# Patient Record
Sex: Male | Born: 1935 | Race: Black or African American | Hispanic: No | Marital: Married | Smoking: Never smoker
Health system: Southern US, Community
[De-identification: ages and names within clinical notes are randomized; demographics above are authoritative.]

## PROBLEM LIST (undated history)

## (undated) DIAGNOSIS — E876 Hypokalemia: Secondary | ICD-10-CM

## (undated) DIAGNOSIS — E785 Hyperlipidemia, unspecified: Secondary | ICD-10-CM

## (undated) DIAGNOSIS — I10 Essential (primary) hypertension: Secondary | ICD-10-CM

## (undated) DIAGNOSIS — R609 Edema, unspecified: Secondary | ICD-10-CM

## (undated) DIAGNOSIS — I69959 Hemiplegia and hemiparesis following unspecified cerebrovascular disease affecting unspecified side: Secondary | ICD-10-CM

## (undated) DIAGNOSIS — I639 Cerebral infarction, unspecified: Secondary | ICD-10-CM

## (undated) DIAGNOSIS — K5900 Constipation, unspecified: Secondary | ICD-10-CM

## (undated) DIAGNOSIS — F32A Depression, unspecified: Secondary | ICD-10-CM

## (undated) DIAGNOSIS — F329 Major depressive disorder, single episode, unspecified: Secondary | ICD-10-CM

## (undated) DIAGNOSIS — F0390 Unspecified dementia without behavioral disturbance: Secondary | ICD-10-CM

## (undated) DIAGNOSIS — E119 Type 2 diabetes mellitus without complications: Secondary | ICD-10-CM

## (undated) DIAGNOSIS — R569 Unspecified convulsions: Secondary | ICD-10-CM

## (undated) HISTORY — DX: Hyperlipidemia, unspecified: E78.5

## (undated) HISTORY — DX: Constipation, unspecified: K59.00

## (undated) HISTORY — DX: Unspecified dementia, unspecified severity, without behavioral disturbance, psychotic disturbance, mood disturbance, and anxiety: F03.90

## (undated) HISTORY — DX: Depression, unspecified: F32.A

## (undated) HISTORY — DX: Hemiplegia and hemiparesis following unspecified cerebrovascular disease affecting unspecified side: I69.959

## (undated) HISTORY — DX: Unspecified convulsions: R56.9

## (undated) HISTORY — DX: Hypokalemia: E87.6

## (undated) HISTORY — DX: Cerebral infarction, unspecified: I63.9

## (undated) HISTORY — DX: Major depressive disorder, single episode, unspecified: F32.9

## (undated) HISTORY — DX: Edema, unspecified: R60.9

---

## 1998-05-09 ENCOUNTER — Encounter: Payer: Self-pay | Admitting: Emergency Medicine

## 1998-05-09 ENCOUNTER — Emergency Department (HOSPITAL_COMMUNITY): Admission: EM | Admit: 1998-05-09 | Discharge: 1998-05-09 | Payer: Self-pay | Admitting: Emergency Medicine

## 2001-12-24 ENCOUNTER — Encounter: Payer: Self-pay | Admitting: Emergency Medicine

## 2001-12-24 ENCOUNTER — Emergency Department (HOSPITAL_COMMUNITY): Admission: EM | Admit: 2001-12-24 | Discharge: 2001-12-24 | Payer: Self-pay | Admitting: Emergency Medicine

## 2002-09-22 ENCOUNTER — Encounter: Payer: Self-pay | Admitting: Emergency Medicine

## 2002-09-22 ENCOUNTER — Emergency Department (HOSPITAL_COMMUNITY): Admission: EM | Admit: 2002-09-22 | Discharge: 2002-09-22 | Payer: Self-pay | Admitting: Emergency Medicine

## 2006-02-04 ENCOUNTER — Emergency Department (HOSPITAL_COMMUNITY): Admission: EM | Admit: 2006-02-04 | Discharge: 2006-02-04 | Payer: Self-pay | Admitting: Emergency Medicine

## 2006-02-09 ENCOUNTER — Observation Stay (HOSPITAL_COMMUNITY): Admission: EM | Admit: 2006-02-09 | Discharge: 2006-02-13 | Payer: Self-pay | Admitting: *Deleted

## 2006-03-16 ENCOUNTER — Ambulatory Visit (HOSPITAL_COMMUNITY): Admission: RE | Admit: 2006-03-16 | Discharge: 2006-03-16 | Payer: Self-pay | Admitting: Gastroenterology

## 2006-03-16 ENCOUNTER — Encounter (INDEPENDENT_AMBULATORY_CARE_PROVIDER_SITE_OTHER): Payer: Self-pay | Admitting: Specialist

## 2006-05-10 ENCOUNTER — Encounter: Admission: RE | Admit: 2006-05-10 | Discharge: 2006-05-10 | Payer: Self-pay | Admitting: Urology

## 2006-10-10 ENCOUNTER — Inpatient Hospital Stay (HOSPITAL_COMMUNITY): Admission: EM | Admit: 2006-10-10 | Discharge: 2006-10-12 | Payer: Self-pay | Admitting: Emergency Medicine

## 2006-10-11 ENCOUNTER — Encounter: Payer: Self-pay | Admitting: Vascular Surgery

## 2006-10-11 ENCOUNTER — Ambulatory Visit: Payer: Self-pay | Admitting: Vascular Surgery

## 2006-10-11 ENCOUNTER — Encounter: Payer: Self-pay | Admitting: Cardiology

## 2006-12-14 ENCOUNTER — Emergency Department (HOSPITAL_COMMUNITY): Admission: EM | Admit: 2006-12-14 | Discharge: 2006-12-14 | Payer: Self-pay | Admitting: Emergency Medicine

## 2007-01-03 ENCOUNTER — Ambulatory Visit (HOSPITAL_COMMUNITY): Admission: RE | Admit: 2007-01-03 | Discharge: 2007-01-04 | Payer: Self-pay | Admitting: Ophthalmology

## 2007-12-11 IMAGING — CT CT HEAD W/O CM
1 series · 16 of 30 positions shown, 20 images · IV contrast (agent unspecified)
Comparison: MRI and CT [DATE] and 10/10/06.

CLINICAL DATA: 82 year-old male, grand mal seizure today.
 HEAD CT WITHOUT CONTRAST:
TECHNIQUE: Contiguous axial images were obtained from the base of the skull through the vertex according to standard protocol without contrast.

[Series 2: head routine 4.8 h47s · axial · 0.39mm/px · z∈[-169,-36]mm · 16 of 30 slices shown, 20 images]
[im 2/30  brain]
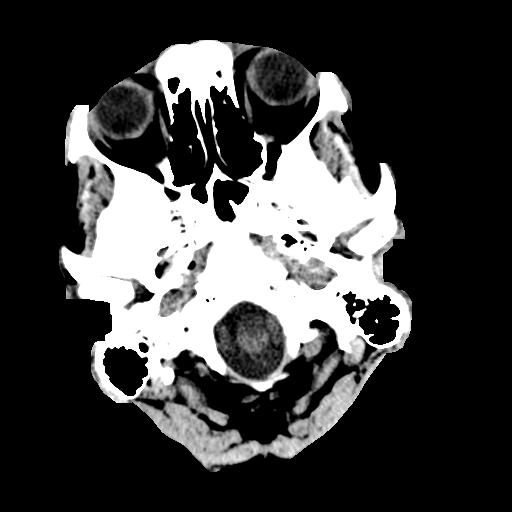
[im 2/30  bone]
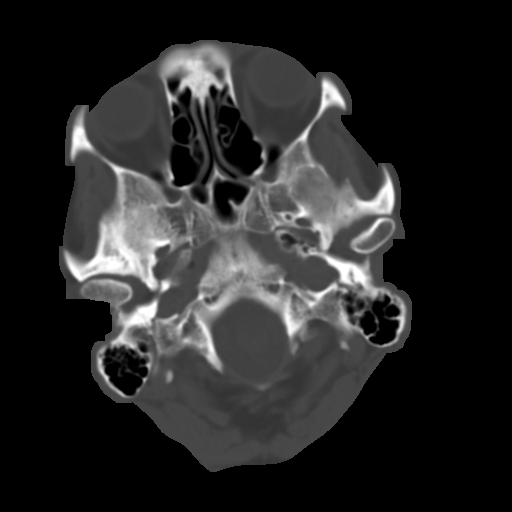
[im 4/30  brain]
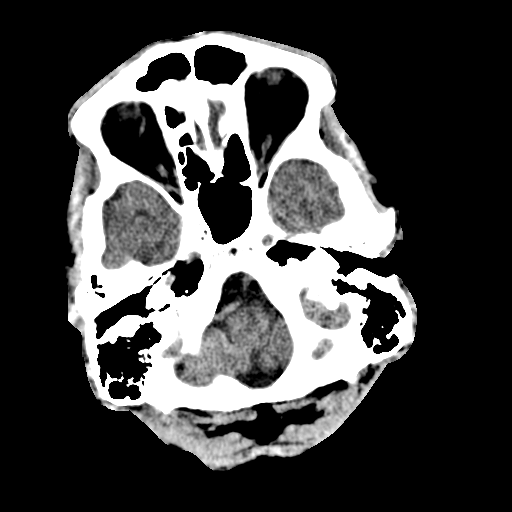
[im 6/30  brain]
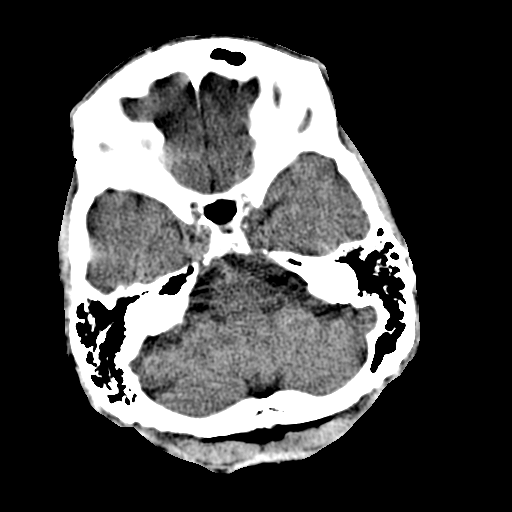
[im 8/30  brain]
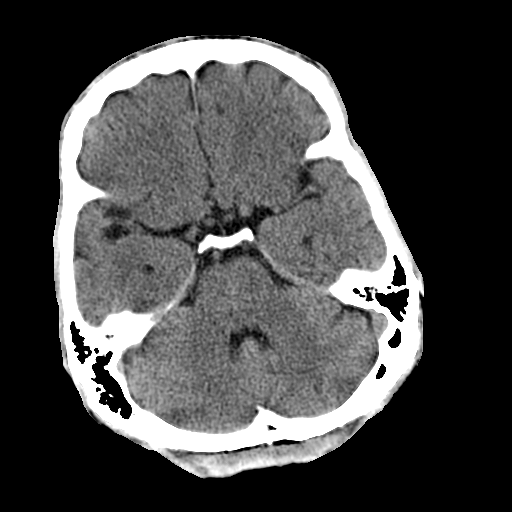
[im 9/30  brain]
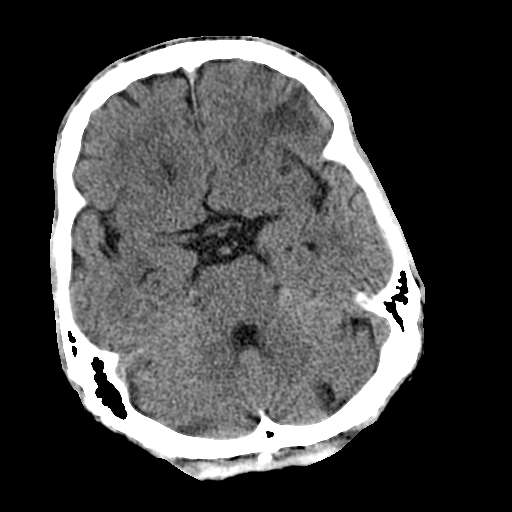
[im 9/30  bone]
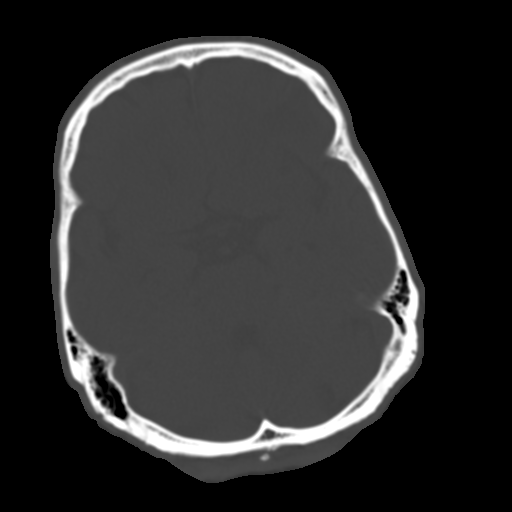
[im 11/30  brain]
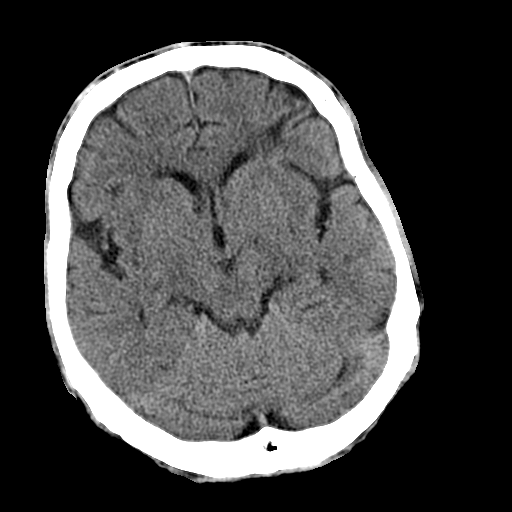
[im 13/30  brain]
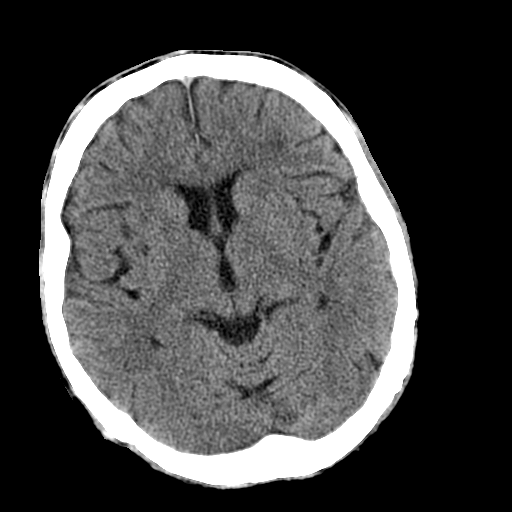
[im 15/30  brain]
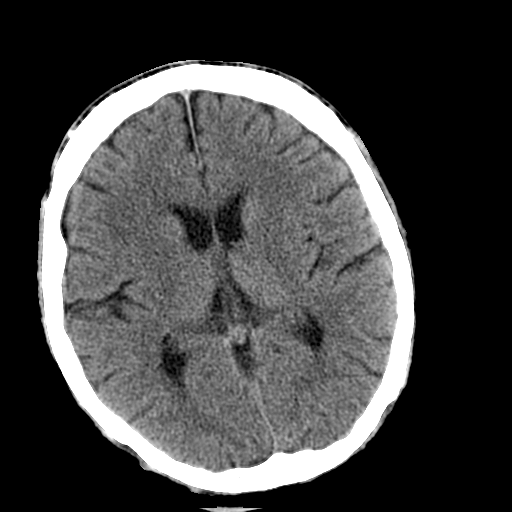
[im 16/30  brain]
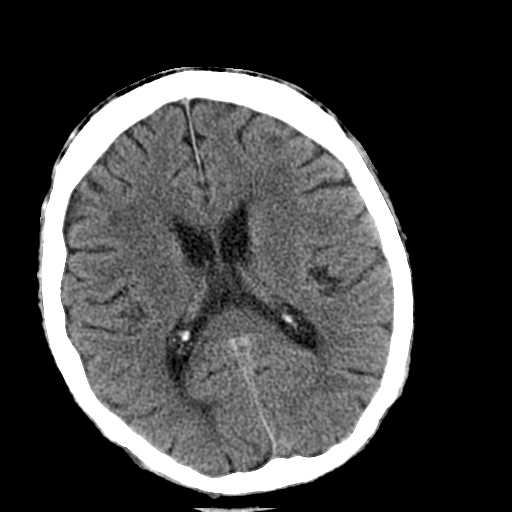
[im 16/30  bone]
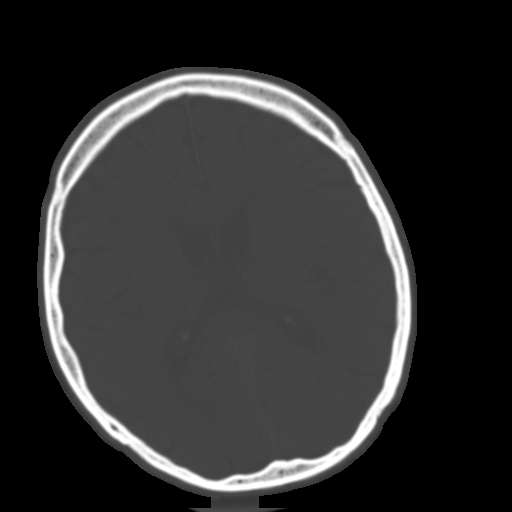
[im 18/30  brain]
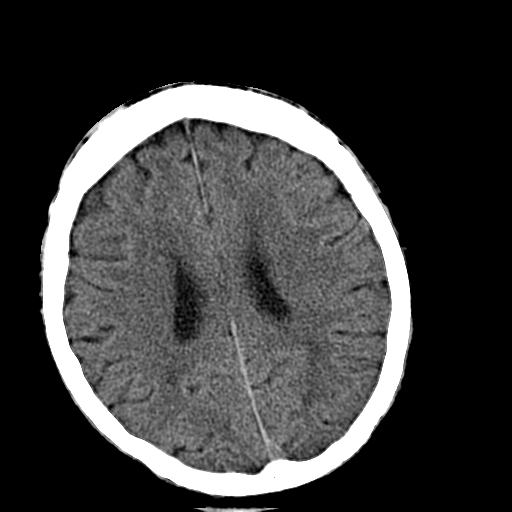
[im 20/30  brain]
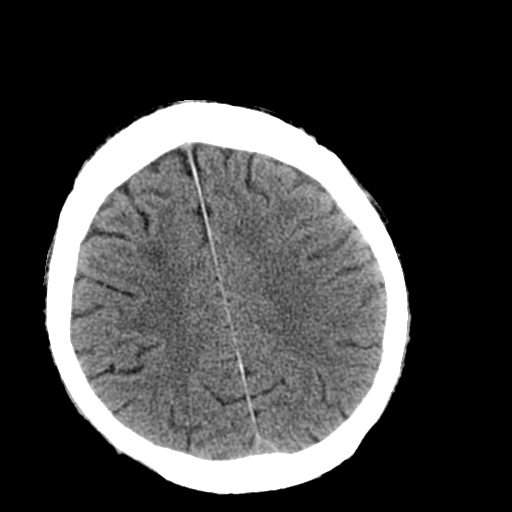
[im 22/30  brain]
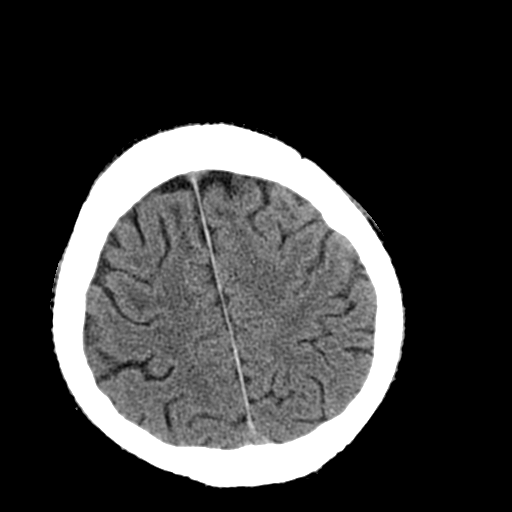
[im 23/30  brain]
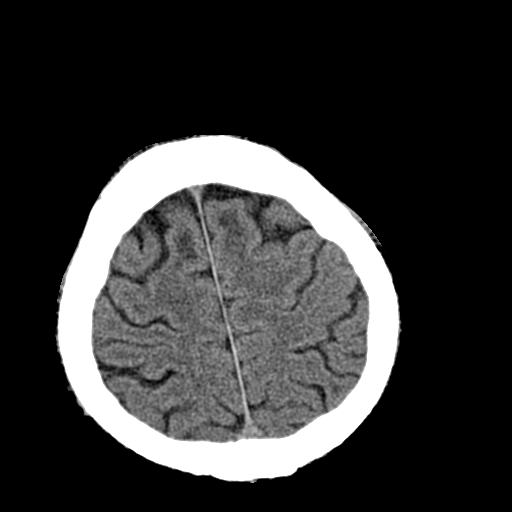
[im 23/30  bone]
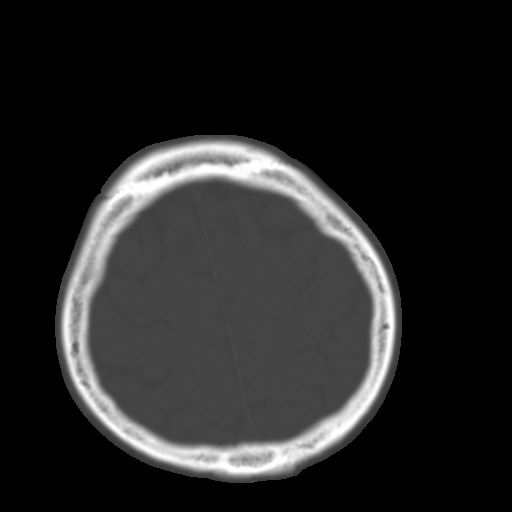
[im 25/30  brain]
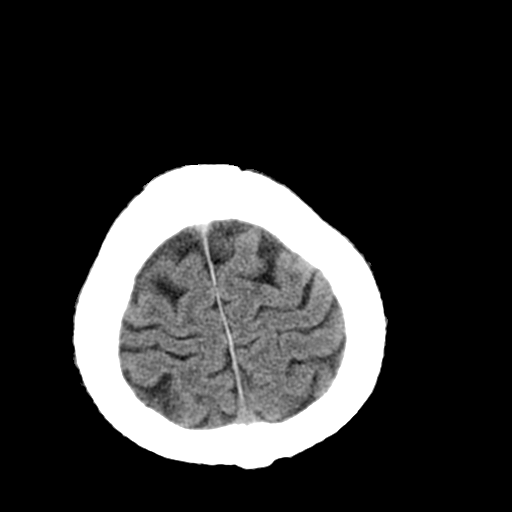
[im 27/30  brain]
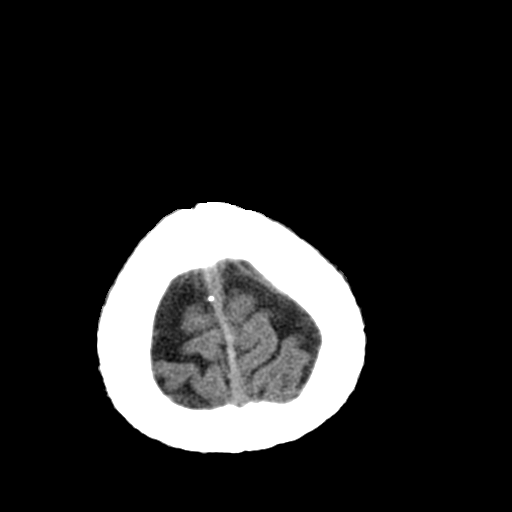
[im 29/30  brain]
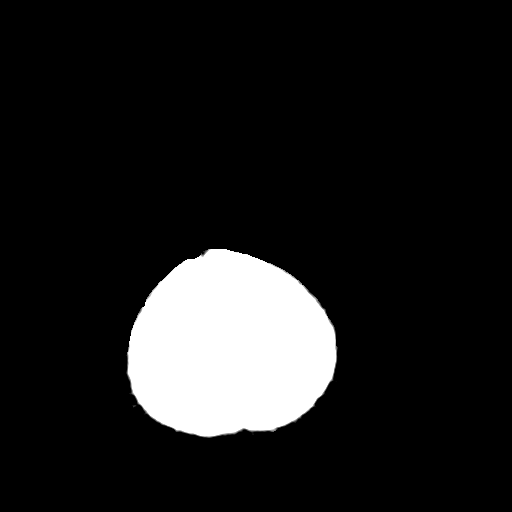

[16 of 30 positions shown; findings below may reference images not displayed]

FINDINGS: Focal area encephalomalacia involving the left frontal lobe is redemonstrated. Minimal periventricular white matter changes also redemonstrated. There is no evidence for acute infarct, hemorrhage, mass, hydrocephalus, or extraaxial fluid collection. The remote lacunar infarct of the right cerebellar hemisphere is also redemonstrated.
IMPRESSION: 1.  Stable encephalomalacia of the left frontal lobe.
 2.  Remote lacunar infarct right cerebellar hemisphere.
 3.  Mild white matter disease appears stable.
 4.  No acute intracranial abnormality.

## 2008-05-10 ENCOUNTER — Emergency Department (HOSPITAL_COMMUNITY): Admission: EM | Admit: 2008-05-10 | Discharge: 2008-05-10 | Payer: Self-pay | Admitting: Emergency Medicine

## 2008-10-25 ENCOUNTER — Emergency Department (HOSPITAL_COMMUNITY): Admission: EM | Admit: 2008-10-25 | Discharge: 2008-10-26 | Payer: Self-pay | Admitting: Emergency Medicine

## 2009-09-08 ENCOUNTER — Emergency Department (HOSPITAL_COMMUNITY): Admission: EM | Admit: 2009-09-08 | Discharge: 2009-09-08 | Payer: Self-pay | Admitting: Emergency Medicine

## 2010-03-12 ENCOUNTER — Inpatient Hospital Stay (HOSPITAL_COMMUNITY): Admission: EM | Admit: 2010-03-12 | Discharge: 2010-03-17 | Payer: Self-pay | Admitting: Neurology

## 2010-03-12 ENCOUNTER — Ambulatory Visit: Payer: Self-pay | Admitting: Cardiology

## 2010-03-12 ENCOUNTER — Ambulatory Visit: Payer: Self-pay | Admitting: Family Medicine

## 2010-03-13 ENCOUNTER — Encounter: Payer: Self-pay | Admitting: Family Medicine

## 2010-03-16 ENCOUNTER — Encounter: Payer: Self-pay | Admitting: Cardiovascular Disease

## 2010-06-02 ENCOUNTER — Ambulatory Visit (HOSPITAL_COMMUNITY)
Admission: RE | Admit: 2010-06-02 | Discharge: 2010-06-02 | Payer: Self-pay | Source: Home / Self Care | Attending: Internal Medicine | Admitting: Internal Medicine

## 2010-08-10 LAB — BASIC METABOLIC PANEL
BUN: 12 mg/dL (ref 6–23)
CO2: 29 mEq/L (ref 19–32)
Chloride: 105 mEq/L (ref 96–112)
Chloride: 106 mEq/L (ref 96–112)
Creatinine, Ser: 1.04 mg/dL (ref 0.4–1.5)
Creatinine, Ser: 1.09 mg/dL (ref 0.4–1.5)
GFR calc Af Amer: >60 mL/min (ref >60)
Glucose, Bld: 108 mg/dL — ABNORMAL HIGH (ref 70–99)
Glucose, Bld: 127 mg/dL — ABNORMAL HIGH (ref 70–99)
Potassium: 5 mEq/L (ref 3.5–5.1)
Sodium: 140 mEq/L (ref 135–145)

## 2010-08-10 LAB — URINALYSIS, ROUTINE W REFLEX MICROSCOPIC
Bilirubin Urine: NEGATIVE
Glucose, UA: NEGATIVE mg/dL
Hgb urine dipstick: NEGATIVE
Ketones, ur: NEGATIVE mg/dL
Protein, ur: NEGATIVE mg/dL
Urobilinogen, UA: 0.2 mg/dL (ref 0.0–1.0)

## 2010-08-10 LAB — CBC
HCT: 34.3 % — ABNORMAL LOW (ref 39.0–52.0)
HCT: 35 % — ABNORMAL LOW (ref 39.0–52.0)
Hemoglobin: 11.6 g/dL — ABNORMAL LOW (ref 13.0–17.0)
Hemoglobin: 12 g/dL — ABNORMAL LOW (ref 13.0–17.0)
MCH: 32.9 pg (ref 26.0–34.0)
MCH: 34 pg (ref 26.0–34.0)
MCH: 34.6 pg — ABNORMAL HIGH (ref 26.0–34.0)
MCHC: 34.3 g/dL (ref 30.0–36.0)
MCHC: 35 g/dL (ref 30.0–36.0)
MCHC: 35.4 g/dL (ref 30.0–36.0)
MCV: 95.8 fL (ref 78.0–100.0)
MCV: 97.2 fL (ref 78.0–100.0)
MCV: 97.8 fL (ref 78.0–100.0)
Platelets: 186 10*3/uL (ref 150–400)
RBC: 3.53 MIL/uL — ABNORMAL LOW (ref 4.22–5.81)
RBC: 3.53 MIL/uL — ABNORMAL LOW (ref 4.22–5.81)
RDW: 12.5 % (ref 11.5–15.5)
RDW: 12.6 % (ref 11.5–15.5)
WBC: 6.3 10*3/uL (ref 4.0–10.5)

## 2010-08-10 LAB — COMPREHENSIVE METABOLIC PANEL
Albumin: 4 g/dL (ref 3.5–5.2)
Alkaline Phosphatase: 101 U/L (ref 39–117)
BUN: 13 mg/dL (ref 6–23)
Chloride: 104 mEq/L (ref 96–112)
Creatinine, Ser: 1.29 mg/dL (ref 0.4–1.5)
Glucose, Bld: 107 mg/dL — ABNORMAL HIGH (ref 70–99)
Total Bilirubin: 0.6 mg/dL (ref 0.3–1.2)

## 2010-08-10 LAB — CARDIAC PANEL(CRET KIN+CKTOT+MB+TROPI)
CK, MB: 1.2 ng/mL (ref 0.3–4.0)
Relative Index: 1 (ref 0.0–2.5)
Relative Index: 1 (ref 0.0–2.5)
Total CK: 122 U/L (ref 7–232)
Total CK: 130 U/L (ref 7–232)
Troponin I: 0.03 ng/mL (ref 0.00–0.06)

## 2010-08-10 LAB — PROTIME-INR
INR: 0.91 (ref 0.00–1.49)
Prothrombin Time: 12.5 seconds (ref 11.6–15.2)
Prothrombin Time: 13.1 seconds (ref 11.6–15.2)

## 2010-08-10 LAB — APTT
aPTT: 27 seconds (ref 24–37)
aPTT: 30 seconds (ref 24–37)

## 2010-08-10 LAB — DIFFERENTIAL
Basophils Absolute: 0 10*3/uL (ref 0.0–0.1)
Basophils Relative: 0 % (ref 0–1)
Lymphocytes Relative: 44 % (ref 12–46)
Monocytes Absolute: 0.7 10*3/uL (ref 0.1–1.0)
Neutro Abs: 2.7 10*3/uL (ref 1.7–7.7)
Neutrophils Relative %: 43 % (ref 43–77)

## 2010-08-10 LAB — LIPID PANEL
Cholesterol: 180 mg/dL (ref 0–200)
LDL Cholesterol: 109 mg/dL — ABNORMAL HIGH (ref 0–99)
Triglycerides: 121 mg/dL (ref <150)
VLDL: 24 mg/dL (ref 0–40)

## 2010-08-10 LAB — CK TOTAL AND CKMB (NOT AT ARMC)
CK, MB: 1.5 ng/mL (ref 0.3–4.0)
Relative Index: 1.3 (ref 0.0–2.5)
Total CK: 117 U/L (ref 7–232)

## 2010-08-10 LAB — TROPONIN I: Troponin I: 0.02 ng/mL (ref 0.00–0.06)

## 2010-08-10 LAB — HEMOGLOBIN A1C: Hgb A1c MFr Bld: 6.4 % — ABNORMAL HIGH (ref <5.7)

## 2010-09-06 LAB — POCT I-STAT, CHEM 8
Glucose, Bld: 133 mg/dL — ABNORMAL HIGH (ref 70–99)
HCT: 38 % — ABNORMAL LOW (ref 39.0–52.0)
Hemoglobin: 12.9 g/dL — ABNORMAL LOW (ref 13.0–17.0)
Potassium: 4.5 mEq/L (ref 3.5–5.1)
Sodium: 141 mEq/L (ref 135–145)

## 2010-09-06 LAB — CBC
MCHC: 34.1 g/dL (ref 30.0–36.0)
Platelets: 140 10*3/uL — ABNORMAL LOW (ref 150–400)
RDW: 13.6 % (ref 11.5–15.5)

## 2010-09-06 LAB — URINALYSIS, ROUTINE W REFLEX MICROSCOPIC
Bilirubin Urine: NEGATIVE
Hgb urine dipstick: NEGATIVE
Specific Gravity, Urine: 1.008 (ref 1.005–1.030)
Urobilinogen, UA: 0.2 mg/dL (ref 0.0–1.0)

## 2010-09-06 LAB — POCT CARDIAC MARKERS
CKMB, poc: 1.4 ng/mL (ref 1.0–8.0)
Myoglobin, poc: 104 ng/mL (ref 12–200)

## 2010-10-11 NOTE — Discharge Summary (Signed)
NAMEIDRIS, EDMUNDSON NO.:  [id number]   MEDICAL RECORD NO.:  [id number]          PATIENT TYPE:  INP   LOCATION:  2025                         FACILITY:  [facility]   PHYSICIAN:  Beckey Rutter, MD  DATE OF BIRTH:  Oct 13, 1923   DATE OF ADMISSION:  10/10/2006  DATE OF DISCHARGE:  10/12/2006                               DISCHARGE SUMMARY   PRIMARY CARE PHYSICIAN:  Dr. Pershing Proud.   Patient is unassigned to [facility].   CHIEF COMPLAINT ON ADMISSION:  Syncope.   HISTORY OF PRESENT ILLNESS:  Please to the H&P dictated on the day of  admission for full H&P by Dr. Lonia Blood.   BRIEF HOSPITAL COURSE:  1. Mr. Bomkamp is an 77 year old male with past medical history      significant for dementia presented after fall.  Upon detailed      questioning the wife the patient was sitting watching TV for some      time and then he stood up and walked towards the kitchen.  Within      less than 30 seconds patient fell down.  The wife did not really      saw what happened, but she heard the bang on the floor.  The      patient was brought in and he was admitted for syncope workup.      During hospitalization the patient was monitored on the telemetry      floor with no major arrhythmia documented to suggest cardiogenic      arrhythmic reason for his syncope.  The patient had a 2D echo done,      which essentially reading overall left ventricular systolic      function was normal.  There was no left ventricular regional wall      abnormalities.  Aortic valve thickness was mildly to moderately      increased.  There was mild aortic valve regurgitation.  Impression:      There is no echocardiographic evidence of cardiac source of      embolism.  The patient had a CAT scan done on the admission day,      which is essentially negative and impression reading:  No acute      intracranial abnormalities.  The patient had MRI and MRA done on      Oct 11, 2006.  Impression:  Reading  for the MRI - chronic ischemic      change, no acute abnormalities.  The MRA of the brain impression      reading:  No significant intracranial stenosis.  The patient is      likely to have a simple vasovagal attack in the setting of advanced      dementia and the reason and possibilities of his syncope is      discussed with his wife, as well as the plan for discharge.   1. During hospitalization patient became agitated during the      nighttime, which required restraints and a small dose of Haldol.      This agitation is likely because of  sundowning delirium in the      setting of dementia.  I think that patient will benefit from      discharge home for the familiar environment he used to live before      admission.   1. Dementia.  The patient taking was BuSpar.  I am going to discharge      him on BuSpar, as well as Namenda at this time.   DISCHARGE DIAGNOSES:  1. Syncope likely due to vasovagal activity.  2. Dementia.  3. Sundowning/delirium.   DISCHARGE MEDICATIONS:  1. Aspirin 325 mg p.o. daily.  2. BuSpar 7.5 mg p.o. b.i.d.  3. Namenda 5 mg p.o. daily to be taken for a week and then this should      be increased to 5 mg p.o. q.12 hours.   The patient was recommended to follow up with his primary physician for  further assessment and medication adjustment.  He is stable for  discharge today.      Beckey Rutter, MD  Electronically Signed     EME/MEDQ  D:  10/12/2006  T:  10/12/2006  Job:  [id number]

## 2010-10-11 NOTE — H&P (Signed)
NAME:  Danny Gilbert, Danny Gilbert NO.:  [id number]   MEDICAL RECORD NO.:  [id number]          PATIENT TYPE:  INP   LOCATION:  2025                         FACILITY:  [facility]   PHYSICIAN:  Lonia Blood, M.D.      DATE OF BIRTH:  [birth year]   DATE OF ADMISSION:  10/10/2006  DATE OF DISCHARGE:                              HISTORY & PHYSICAL   PRIMARY CARE PHYSICIAN:  The patient is unassigned to [place], his primary  care physician is Dr. Lerry Liner in [facility].   PRESENTING COMPLAINT:  Passing out.   HISTORY OF PRESENT ILLNESS:  The patient is an 77 year old African  American male with history of Paget's disease of the bone and microcytic  anemia who was apparently doing okay, watching TV with his wife tonight  when wife noticed that he fell down to the floor.  He passed out for  about 2 minutes.  He came to and the wife decided to bring him to the  emergency room.  The patient had no recollection of events at all.  Denied any dizziness.  Denied any headaches and no focal weakness.  The  patient has had a previous unnoticed stroke.  He has no cardiac history  that he is aware of.  He has been taking his medications as needed.  He  is currently back to his normal self and no residual effect.  Per wife,  there was no evidence of jerky movement at the time.  No loss of bladder  or bowel continence.   PAST MEDICAL HISTORY:  Significant for:  1. Microcytic anemia.  2. Dementia.  3. 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   ALLERGIES:  THE PATIENT HAS NO KNOWN DRUG ALLERGIES.   MEDICATIONS:  Include:  1. Buspirone 15 mg.  He takes half tablets twice a day.  2. Oxycodone ER 10 mg q.6-8 hours p.r.n.  3. Multivitamins.  4. The patient just finished Amitiza that he took 1 capsule of 24 mg      twice a day.  He finished a two week supply.   SOCIAL HISTORY:  The patient lives with his wife.  No tobacco or alcohol  use.  No IV drug use.  He is originally from [place] in [place]  and  has not been back since June 2007.   FAMILY HISTORY:  No significant past family history.   PHYSICAL EXAMINATION:  VITAL SIGNS:  The patient is afebrile with a  temperature 98.5, blood pressure 132/66, pulse of 80, respiratory rate  18, his sats are 100% on room air.  GENERAL:  The patient is awake, alert, oriented in no acute distress.  Mild cognitive defect noted.  Wife at this side.  HEENT:  PERRL, EOMI.  NECK:  Supple.  No JVD.  No lymphadenopathy.  RESPIRATORY:  He has good air entry bilaterally.  No wheezes or rales.  CARDIOVASCULAR:  The patient has S1, S2.  No murmurs.  ABDOMEN:  Soft, nontender, nondistended with positive bowel sounds.  EXTREMITIES:  Showed no edema, cyanosis, or clubbing.   LABORATORY DATA:  Showed  white count 5.1, hemoglobin 11, with MCV 100.5,  platelet count 203.  Sodium 133, potassium 4.3, chloride 100, CO2 28,  glucose 118, BUN 16, creatinine 0.91.  Normal LFTs.  Urinalysis is  negative.  Initial cardiac enzymes also negative.   EKG showed sinus rhythm with a rate of 76, normal intervals.  The  patient has evidence of left ventricular hypertrophy by voltage  criteria.  Chest x-ray was negative.  Head CT without contrast showed an  old stroke, but nothing new.   ASSESSMENT:  This is an 77 year old gentleman presenting with syncopal  episodes.  Differentials varied included cardiac, probably some  arrhythmia, especially with  evidence of ventricular hypertrophy.  It  could also mean he has had another stroke, especially with a past  history of stroke.  A possibility it could be vasovagal or dehydration,  especially with his hyponatremia.   PLAN:  1. Syncope.  We will admit the patient to a tele bed and monitor him      closely.  We will check orthostatic's.  We will also check an      MRI/MRA of the brain, carotid Doppler's, and 2D echocardiogram.      Meanwhile, I will start the patient on some IV fluids.  I will also      use some PT and OT.   Check fasting lipid panel, homocystine,      etcetera.  If everything else is negative, the patient may have      vasovagal syncope.  2. Paget's disease.  The main issue here is treatment with his      medications.  Otherwise, we will continue his home medicines,      mainly oxycodone as needed.  Of note, the patient has not missed      any of his medications, so question of withdrawal from narcotics      may not be likely.  3. Mild dementia.  The patient is not on any medications for that.  4. Microcytic anemia.  We will check B12 and folate levels.  Also      check RPR.  We will check his hemoglobin while hydrating in the      hospital.  5. Otherwise other treatment will depend on the patient's results from      our workup.  The patient is a full code.      Lonia Blood, M.D.  Electronically Signed     LG/MEDQ  D:  10/10/2006  T:  10/11/2006  Job:  [id number]

## 2010-10-11 NOTE — H&P (Signed)
NAME:  Danny Gilbert, Danny Gilbert NO.:  [id number]   MEDICAL RECORD NO.:  [id number]          PATIENT TYPE:  EMS   LOCATION:  MAJO                         FACILITY:  [facility]   PHYSICIAN:  Renee Ramus, MD       DATE OF BIRTH:  [birth year]   DATE OF ADMISSION:  10/25/2008  DATE OF DISCHARGE:                              HISTORY & PHYSICAL   HISTORY OF PRESENT ILLNESS:  The patient is a 77 year old male who had  an unwitnessed syncopal episode either while either sitting or standing  (patient is unsure which is which) recently i.e. within the last 2 days.  The patient said his Lasix increased from 40 mg p.o. daily to 60 mg p.o.  daily.  The patient takes Lasix secondary to chronic lower extremity  edema.  The patient was admitted in 2008, for a syncopal episode.  He  had an extensive workup including echocardiogram, MRA, and MRI, all of  which were negative.  The patient at that time was found to have  normal  functioning heart with no evidence of failure and normal valve  architecture.  He had no stenosis in his cerebral vessels.  He had no  evidence of stroke.  The patient admits to decrease p.o. intake  recently.  He denies a weight change, however he does report that their  scale has broken and while he used to weigh himself every day with  regards to keeping track of his diuresis.  He has not been able to do  that recently.  The patient does deny fevers, chills, night sweats,  nausea, vomiting, chest pain, shortness breath, PND, or orthopnea.  The  patient is now fully recovered and stable.   PAST MEDICAL HISTORY:  1. Syncope.  2. Chronic lower extremity edema.  3. Dementia.  4. Anxiety.   SOCIAL HISTORY:  The patient denies alcohol or tobacco use.  Lives with  his wife and a supportive family.   FAMILY HISTORY:  Not available.   REVIEW OF SYSTEMS:  All other comprehensive review systems are negative.   MEDICATIONS:  The patient has no known drug allergies.   CURRENT MEDICATIONS:  1. BuSpar 15 mg p.o. daily.  2. Namenda 5 mg p.o. daily.  3. Lasix 60 mg p.o. daily.  4. Klor-Con 10 mEq p.o. daily.   PHYSICAL EXAMINATION:  GENERAL:  The patient is a well-developed, well-  nourished African male, currently in no apparent distress.  VITAL SIGNS:  Blood pressure 161/78, heart rate 93, respiratory rate 20,  temperature 97.6.  HEENT:  No jugular venous distention or lymphadenopathy.  Oropharynx is  clear.  Mucous membranes pink and moist.  TMs clear bilaterally.  Pupils  equal and reactive to light and accommodation.  Extraocular muscles are  intact.  CARDIOVASCULAR:  Regular rate and rhythm albeit tachy but no murmurs,  rubs, or gallops.  PULMONARY:  Lungs are clear to auscultation bilaterally.  ABDOMEN:  Soft, nontender, nondistended without hepatosplenomegaly.  Bowel sounds are present.  He has no rebound or guarding.  EXTREMITIES:  He has no clubbing or cyanosis.  He  does have +1 or +2  lower extremity edema.  He has good peripheral pulses and dorsalis pedis  in radial arteries.  He is able to move all extremities.  NEURO:  Cranial nerves II through XII are grossly intact.  There is no  focal neurological deficits.   LABORATORY STUDIES:  White count 6.0, H&H 12 and 38, MCV 90, platelets  140.  Sodium 141, potassium 4.5, chloride 106, bicarb 28, BUN 16,  creatinine 1.4 with baseline creatinine of 0.8, glucose 133.  UA shows  no evidence of infection with specific gravity of 1.008.   STUDIES:  1. CT of the head shows no acute disease.  2. EKG shows no acute disease.   ASSESSMENT/PLAN:  1. Syncope against vasovagal episode secondary to dehydration coupled      likely with his BuSpar and his dementia.  We have given him 2 L, he      feels fine.  He will be discharged to home with instructions to      follow up with his primary care physician.  2. Dementia, stable.  3. Anxiety, stable.  4. Lower extremity edema.  The patient will continue  his Lasix therapy      but has been instructed to be careful with monitoring his p.o.      intake.   DISPOSITION:  The patient will be discharged to home.   H&P was constructed by reviewing past medical history, conferring with  emergency medical room physician, reviewing the emergency medical  record.   PRIMARY DISCHARGE DIAGNOSIS:  Neurocardiogenic syncope.   SECONDARY DIAGNOSES:  1. Anxiety.  2. Dementia.  3. Chronic lower extremity edema.   Time spent on admission and discharge is 1 hour.      Renee Ramus, MD  Electronically Signed     JF/MEDQ  D:  10/26/2008  T:  10/26/2008  Job:  [id number]   cc:   Dr. Pershing Proud

## 2010-10-11 NOTE — Op Note (Signed)
NAMESAMANTHA, Danny Gilbert NO.:  [id number]   MEDICAL RECORD NO.:  [id number]          PATIENT TYPE:  OIB   LOCATION:  5713                         FACILITY:  [facility]   PHYSICIAN:  Beulah Gandy. Ashley Royalty, M.D. 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   PROCEDURES:  Pars plana vitrectomy with 20-gauge system, pan retinal  photocoagulation, membrane peel, removal of lens material from the  vitreous in the left eye.   SURGEON:  Alan Mulder, MD.   ASSISTANT:  Bryan Lemma. Lundquist, P.A.   ANESTHESIA:  General.   DESCRIPTION OF PROCEDURE:  Usual prep and drape, peritomies at 10, 2 and  4 o'clock.  Infusion port at 4 o'clock.  The lighted pick and the cutter  placed at 10 and 2 o'clock respectively.  Pars plana vitrectomy was  begun just behind the pseudophakos.  Much lens material was encountered  around the edges of the intraocular lens.  Some capsular pieces and  cortical remnants were removed from 360 degrees of the lens.  The  vitrectomy is carried posteriorly where large white fragments and yellow  fragments were encountered.  These were carefully removed under low  suction and rapid cutting. The fragmatome was prepared but not used.  The vitrectomy was carried out into the periphery and white granular  material was seen in the vitreous cavity.  The lighted pick was used to  engage posterior membranes along the macular surface and the surface of  the disk.  These membranes were peeled with the pick and removed with  the vitreous cutter. A combination of indirect ophthalmoscope laser and  endolaser was used to seal the edge of the retina and some suspicious  areas at 5 o'clock.  The areas were suspicious for retinal break. The  power was between 801,000 milliwatts, 436  burns of indirect laser and  209 burns of burns of  endolaser was used. The spot size was 1000  microns, duration 0.1 second. The vitreous cavity was washed and washed  over-and-over again until no particles were seen in the high intensity  magnification. All vitreous remnants and lens remnants were removed.  The instruments were removed from the eye and 9-0 nylon was used to  close the sclerotomy sites.  The wounds were tested and found to be  tight.  The conjunctiva was closed with wet-field cautery.  Polymyxin  and gentamicin were irrigated into [place] space. Decadron 10 mg was  injected into the lower subconjunctival space.  Marcaine was injected  around the globe for postop pain.  TobraDex ophthalmic ointment, a patch  and shield were placed.  Closing pressure was 10 with a Production manager.  Complications none.  Duration 1 hour.  The patient was  awakened and taken to recovery in satisfactory condition.      Beulah Gandy. Ashley Royalty, M.D.  Electronically Signed     JDM/MEDQ  D:  01/03/2007  T:  01/04/2007  Job:  [id number]

## 2010-10-14 NOTE — Discharge Summary (Signed)
NAMERANDEE, Danny Gilbert NO.:  [id number]   MEDICAL RECORD NO.:  [id number]          PATIENT TYPE:  OBV   LOCATION:  5023                         FACILITY:  [facility]   PHYSICIAN:  Lonia Blood, M.D.       DATE OF BIRTH:  [birth year]   DATE OF ADMISSION:  02/09/2006  DATE OF DISCHARGE:  02/13/2006                                 DISCHARGE SUMMARY   The patient's primary care physician is Dr. Feliciana Rossetti with the [facility]   DISCHARGE DIAGNOSIS:  1. Paget disease involving mainly the left ileum, but also probably the      left side of the sacrum as well as the L4 vertebral body.  2. Large left hydrocele.  3. Indeterminate 8 mm peripheral nodule in the left lower lobe repeat CT      scan of the chest needed in December 2007.  4. Mild cognitive deficit  5. Macrocytic anemia. Recommended outpatient colonoscopy, follow-up CBC      with a primary care physician.   DISCHARGE MEDICATIONS:  1. Multivitamin 1 tablet daily.  2. OxyContin 10 mg by mouth at bedtime.  3. Percocet 5/325 mg every 4 hours as needed for pain.  4. Flomax 0.4 mg by mouth daily.  5. Pamidronate 30 mg infused intravenously over 4 hours once a month to be      arranged through the primary care physician's office.  6. Megace 400 mg by mouth daily.   CONDITION ON DISCHARGE:  Danny Gilbert was discharged in good condition. At  time of discharge his vital signs were stable and he was afebrile.  The  patient was instructed follow up with his primary care physician Dr. Quintella Reichert.  The issues to follow up on are the patient's anemia workup which needs to be  completed by doing a colonoscopy.  The patient and his family were informed  about the above.  Another issue to follow up is a repeat CT scan of the  chest with intravenous contrast to follow up the small nodule that was seen  on the CT scan of the chest.   PROCEDURES DURING THIS ADMISSION:  1. On February 09 2006 the patient underwent a  portable chest x-ray that      showed no active cardiopulmonary disease.  2. February 10, 2006 CT scan of the chest, abdomen and pelvis which      showed a small 8 mm peripheral nodule in the left lower lobe. Follow-up      CT recommended in 3 months.  3. Abdominal CT scan which showed cortical thickening involving the left      pelvic bone consistent with Paget's disease.  4. February 10, 2006 ultrasound of the scrotum showing large left      hydrocele and very small right hydrocele and no testicular mass.  5. February 11, 2006, MRI of the pelvis showing findings consistent with      Paget's disease.  6. Serum protein electrophoresis and urine protein electrophoresis which      was negative for presence of monoclonal proteins.  For admission history and physical refer to the dictated H&P which was done  on February 09, 2006 by Dr. Gasper Sells.   HOSPITAL COURSE.:  1. Intractable hip pain.  Danny Gilbert was admitted for intractable left      hip pain and further workup of his newly diagnosis of probable Paget's      disease.  The patient was admitted to the hospital and was started on      intravenous narcotics as well as oral OxyContin.  The patient responded      well to the treatment and his pain became quickly under control.  To      further characterize his findings on the x-ray, the patient underwent      an MRI of the pelvis which confirmed the presence of Paget's disease in      the left ilium.  The patient was started on intravenous treatment with      pamidronate for 3 days and he completed the treatment on February 12, 2006.  By the time of discharge the patient's pain has improved      significantly and it was under control with just a low dose narcotic.      The patient was instructed follow up with his primary care physician      for once a month intravenous infusions of pamidronate.  A follow-up      alkaline phosphatase can also be done, but we have noted that  the      actual alkaline phosphatase was not that elevated at the time of      admission.  2. Pathological weight loss.  The patient's family had reported that the      patient has lost appetite over the past month and lost a significant      amount of weight.  We have pursued a workup to look for possible      malignancy.  In order to achieve that goal, we have obtained a CT scan      of the chest, abdomen, pelvis as well as ultrasound of the testicles      and serum protein and urine protein electrophoresis.  All these tests      failed to show any significant pathology to explain the weight loss.      In order to probably further workup of this patient's weight loss, he      should have a colonoscopy as an outpatient.  We would also recommend      consideration of the possibility of depression or mild cognitive      deficits as contributing to the patient's weight loss.  3. Anemia.  This seems to be mild. not associated with any acute or      chronic bleeding as far as we can ascertain.  The anemia has a degree      of macrocytosis and is consistent with a reported treatment with      vitamin B12 that the patient was receiving.  Further workup of this      anemia can be done as an outpatient by his primary care physician.      Lonia Blood, M.D.  Electronically Signed     SL/MEDQ  D:  02/13/2006  T:  02/15/2006  Job:  [id number]   cc:   Lacretia Leigh. Quintella Reichert, M.D.

## 2010-10-14 NOTE — H&P (Signed)
Danny Gilbert, Danny Gilbert NO.:  [id number]   MEDICAL RECORD NO.:  [id number]          PATIENT TYPE:  INP   LOCATION:  1832                         FACILITY:  [facility]   PHYSICIAN:  Thomasenia Bottoms, MDDATE OF BIRTH:  1923/08/08   DATE OF ADMISSION:  02/09/2006  DATE OF DISCHARGE:                                HISTORY & PHYSICAL   PRIMARY CARE PHYSICIAN:  Lerry Liner, M.D. in [facility].   CHIEF COMPLAINT:  Intractable left hip pain.   HISTORY OF PRESENTING ILLNESS:  Mr. Kadlec is a very pleasant 77 year old  gentleman who presented to his primary care physician's office today with  the complaint of continued severe left hip pain and continued weight loss  and failure to thrive.  The patient's wife, who provides the history, tells  me that he has lost about 50 pounds over the last 9 months.  He has had  increasing trouble with left hip pain, as well.  He has been started on pain  medication and continues to have left hip pain and some difficulty walking  now.  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  The patient and his wife went back to their primary care physician  today because of the continued symptoms, and he was sent to the emergency  department for treatment and management of his continued pain, failure to  thrive and new diagnosis.   PAST MEDICAL HISTORY:  Significant only for a right leg DVT approximately 15  years ago.  He has been quite healthy since that time.   SOCIAL HISTORY:  No tobacco abuse.  No alcohol or illicit drug use.  He is  accompanied by his wife.  The patient is from [place], [place], and last  traveled there in June of 2007.   FAMILY HISTORY:  The patient denies any family history of any cancer, joint  problems, or cardiovascular disease.   MEDICATIONS ON ARRIVAL:  1. Vicodin, which is hydrocodone/APAP 5/500 mg   He has been taking 1-2      every 6 hours as needed for pain and has been using them quite      sparingly.  2. He takes a multivitamin.  3. Flomax.  4. He has also received B-12 injections, his wife tells me.   REVIEW OF SYSTEMS:  CONSTITUTIONAL:  The patient has a 50 pound weight loss  despite eating quite well, his wife says.  Apparently, a couple of months  ago perhaps he was not eating well, but in the last several weeks he has  been eating beautifully, and despite that he continues to lose weight.  The  patient denies any dysphagia, nausea, loss of appetite.  He has had no  fever, no night sweats.  HEENT:  He denies any headaches, double vision,  sinus problems.  He has occasional nose bleeds, but no sore throat.  CARDIOVASCULAR:  No chest pain, no palpitations, no lower extremity edema.  RESPIRATORY:  No hemoptysis, no cough.  GI:  He denies constipation,  diarrhea, abdominal pain.  He has not vomited blood or seen any blood in his  stool.  GU:  The patient says he has had no urinary difficulties whatsoever;  however, I see he is on Flomax.  He denies any hematuria.  MUSCULOSKELETAL:  Significant pain in the left hip.  Now the pain really goes down his left  leg, in his left knee, also in his left elbow.  NEUROLOGIC:  He denies any  paresthesias or numbness.  He denies any asymmetric weakness other than what  the pain causes.  No seizures.  INTEGUMENTARY:  He has a small scar on his  left shin, but otherwise no rashes or open lesions.  HEMATOLOGIC:  No  troubles with bruising easily.   DATA:  There is no laboratory data at this time.  He does have some lab  results from his primary care physician's office, and that reveals a PSA  which is normal at 0.53, a fasting cholesterol of 148, a triglyceride level  also normal at 65, HDL 67.  His testosterone level is low at 264.37 with 350-  890 being normal limits.  The patient also has a creatinine of 0.8.  These  tests were done on  January 30, 2006.  AST is normal at 14, ALT 22, total  protein 7.3.  His alkaline phosphatase is high at 153.  Total bilirubin  normal at 147.   PHYSICAL EXAMINATION:  GENERAL:  The patient is in no acute distress.  He is  a slightly wasted-appearing gentleman.  HEENT:  Normocephalic and atraumatic.  Pupils are equal and round.  Sclerae  are nonicteric.  He does have arcus senilis.  His oral mucosa is moist.  NECK:  Supple.  No lymphadenopathy, no thyromegaly.  CARDIAC:  Regular rate and rhythm.  LUNGS:  Clear to auscultation with no wheezing, rhonchi or rales.  ABDOMEN:  Soft, nontender, nondistended.  Normoactive bowel sounds.  No  masses are appreciated.  EXTREMITIES:  No evidence of clubbing, cyanosis, or edema.  His DP pulses  are not palpable, but his skin is warm and dry.  His right ankle is larger  than his left ankle.  There is no pitting, but it is definitely larger.  His  wife says this is chronic.  NEUROLOGIC:  He is alert and oriented x3.  He is cooperative.  He has no  facial asymmetry.  Cranial nerves are grossly intact.  He has 5/5 strength  in his upper extremities.  His sensory exam was intact grossly in his upper  and lower extremities.  Lower extremity strength was not tested because of  the hip pain.  He has down-going Babinski reflexes bilaterally.  SKIN:  Intact grossly.  MUSCULOSKELETAL:  There was no evidence of acute sinusitis.   DATA:  There are no laboratory studies done at this time.   IMPRESSION:  1. Intractable left hip pain.  2. Significant pathologic weight loss.  3. Failure to thrive.  4. Likely new Paget's disease.   PLAN:  The plan is to admit him to the hospital.  Will check basic labs, put  him on some IV fluids for now, and start him on some scheduled Vicodin for  pain control, though he may need more.  Would also consider physical  therapy.  Paget's disease is rare.  I would recommend getting possibly an endocrine consult, and, in the  meantime, will review current options for  treatment for this patient .     Thomasenia Bottoms, MD  Electronically Signed    CVC/MEDQ  D:  02/09/2006  T:  02/09/2006  Job:  [id number]   cc:   Lerry Liner, M.D.

## 2011-03-03 LAB — DIFFERENTIAL
Eosinophils Relative: 12 % — ABNORMAL HIGH (ref 0–5)
Lymphocytes Relative: 26 % (ref 12–46)
Lymphs Abs: 1.1 10*3/uL (ref 0.7–4.0)
Monocytes Absolute: 0.3 10*3/uL (ref 0.1–1.0)

## 2011-03-03 LAB — CBC
HCT: 33 % — ABNORMAL LOW (ref 39.0–52.0)
Hemoglobin: 11.3 g/dL — ABNORMAL LOW (ref 13.0–17.0)
RDW: 12.8 % (ref 11.5–15.5)
WBC: 4.3 10*3/uL (ref 4.0–10.5)

## 2011-03-13 LAB — DIFFERENTIAL
Basophils Relative: 1
Eosinophils Absolute: 0.2
Monocytes Absolute: 0.3
Monocytes Relative: 5
Neutrophils Relative %: 73

## 2011-03-13 LAB — COMPREHENSIVE METABOLIC PANEL
ALT: 23
Albumin: 3.7
Alkaline Phosphatase: 68
Chloride: 106
Glucose, Bld: 92
Potassium: 4.4
Sodium: 141
Total Bilirubin: 1
Total Protein: 6.4

## 2011-03-13 LAB — BASIC METABOLIC PANEL
BUN: 16
CO2: 29
Chloride: 103
Creatinine, Ser: 0.93

## 2011-03-13 LAB — CBC
MCHC: 34.1
MCV: 99.5
MCV: 100.3 — ABNORMAL HIGH
Platelets: 191
Platelets: 199
WBC: 5.9

## 2011-03-13 LAB — URINALYSIS, ROUTINE W REFLEX MICROSCOPIC
Bilirubin Urine: NEGATIVE
Hgb urine dipstick: NEGATIVE
Specific Gravity, Urine: 1.015
pH: 6.5

## 2011-03-13 LAB — URINE CULTURE
Colony Count: NO GROWTH
Culture: NO GROWTH

## 2011-03-13 LAB — POCT CARDIAC MARKERS: Operator id: 285841

## 2012-08-28 ENCOUNTER — Non-Acute Institutional Stay (SKILLED_NURSING_FACILITY): Payer: PRIVATE HEALTH INSURANCE | Admitting: Adult Health

## 2012-08-28 ENCOUNTER — Encounter: Payer: Self-pay | Admitting: Adult Health

## 2012-08-28 DIAGNOSIS — I639 Cerebral infarction, unspecified: Secondary | ICD-10-CM

## 2012-08-28 DIAGNOSIS — E785 Hyperlipidemia, unspecified: Secondary | ICD-10-CM | POA: Insufficient documentation

## 2012-08-28 DIAGNOSIS — I69959 Hemiplegia and hemiparesis following unspecified cerebrovascular disease affecting unspecified side: Secondary | ICD-10-CM

## 2012-08-28 DIAGNOSIS — F0390 Unspecified dementia without behavioral disturbance: Secondary | ICD-10-CM

## 2012-08-28 DIAGNOSIS — I6350 Cerebral infarction due to unspecified occlusion or stenosis of unspecified cerebral artery: Secondary | ICD-10-CM

## 2012-08-28 DIAGNOSIS — I6930 Unspecified sequelae of cerebral infarction: Secondary | ICD-10-CM | POA: Insufficient documentation

## 2012-08-28 DIAGNOSIS — K5900 Constipation, unspecified: Secondary | ICD-10-CM

## 2012-08-28 DIAGNOSIS — R609 Edema, unspecified: Secondary | ICD-10-CM | POA: Insufficient documentation

## 2012-08-28 DIAGNOSIS — R569 Unspecified convulsions: Secondary | ICD-10-CM | POA: Insufficient documentation

## 2012-08-28 DIAGNOSIS — F329 Major depressive disorder, single episode, unspecified: Secondary | ICD-10-CM | POA: Insufficient documentation

## 2012-08-28 HISTORY — DX: Cerebral infarction, unspecified: I63.9

## 2012-10-16 NOTE — Assessment & Plan Note (Signed)
Will continue lasix 40 mg daily  

## 2012-10-16 NOTE — Assessment & Plan Note (Signed)
Is stable will continue plavix 75 mg daily

## 2012-10-16 NOTE — Assessment & Plan Note (Signed)
Is stable will continue celexa 20 mg daily and buspar 15 mg daily for anxiety and will monitor

## 2012-10-16 NOTE — Assessment & Plan Note (Signed)
Will continue zocor 10 mg daily

## 2012-10-16 NOTE — Assessment & Plan Note (Signed)
Is without change will continue baclofen 10 mg three times daily

## 2012-10-16 NOTE — Assessment & Plan Note (Signed)
No change in status will continue aricept 10 mg daily and will continue namenda 10 mg twice daily

## 2012-10-16 NOTE — Assessment & Plan Note (Signed)
Will continue tegretol 300 mg three times daily

## 2012-10-16 NOTE — Progress Notes (Signed)
Patient ID: Danny Gilbert, male   DOB: 08-20-1923, 79 y.o.   MRN: [id number]  FACILITY: [facility] [place] [facility]  No Known Allergies   Chief Complaint  Patient presents with  . Medical Managment of Chronic Issues    HPI: He is being seen for the management of his chronic illnesses there are no reports of any concerns being forwarded by the nursing staff. He remains stable overall.  He cannot fully participate in the hpi or ros but denies any pain or discomfort.   Past Medical History  Diagnosis Date  . Hyperlipidemia   . Depression   . Seizures   . Hypokalemia   . Dementia   . Hemiplegia affecting dominant side, late effect of cerebrovascular disease   . Constipation   . Edema     History reviewed. No pertinent past surgical history.  VITAL SIGNS BP 132/80  Pulse 70  Ht 6' (1.829 m)  Wt 172 lb (78.019 kg)  BMI 23.32 kg/m2   Patient's Medications  New Prescriptions   No medications on file  Previous Medications   BACLOFEN (LIORESAL) 10 MG TABLET    Take 10 mg by mouth 3 (three) times daily.   BUSPIRONE (BUSPAR) 15 MG TABLET    Take 15 mg by mouth daily.   CARBAMAZEPINE (TEGRETOL) 200 MG TABLET    Take 300 mg by mouth 3 (three) times daily.   CITALOPRAM (CELEXA) 20 MG TABLET    Take 20 mg by mouth daily.   CLOPIDOGREL (PLAVIX) 75 MG TABLET    Take 75 mg by mouth daily.   DONEPEZIL (ARICEPT) 10 MG TABLET    Take 10 mg by mouth at bedtime as needed.   FUROSEMIDE (LASIX) 40 MG TABLET    Take 40 mg by mouth daily.   MEMANTINE (NAMENDA) 10 MG TABLET    Take 10 mg by mouth 2 (two) times daily.   MULTIPLE VITAMIN (MULTIVITAMIN) TABLET    Take 1 tablet by mouth daily.   POLYETHYL GLYCOL-PROPYL GLYCOL (SYSTANE ULTRA) 0.4-0.3 % SOLN    Apply 1 drop to eye 4 (four) times daily. Both eyes   POLYETHYLENE GLYCOL (MIRALAX / GLYCOLAX) PACKET    Take 17 g by mouth daily.   SIMVASTATIN (ZOCOR) 10 MG TABLET    Take 10 mg by mouth at bedtime.   VITAMIN B-12 (CYANOCOBALAMIN) 100 MCG  TABLET    Take 100 mcg by mouth daily.  Modified Medications   No medications on file  Discontinued Medications   No medications on file    SIGNIFICANT DIAGNOSTIC EXAMS  04-19-10: right upper extremity doppler: negative for dvt  06-02-10: mri of head: 1) evolution of a left basal ganglia and corona radiata infarct (2) otherwise stable basal ganglia infarcts and extensive white matter disease (3) stable cerebellar infarcts (4) no acute intracranial abnormality (5) right maxillary and bilateral frontal ethmoid mucosal thickening.   LABS REVIEWED:  11/10/11 wbc 4.5,hgb 11.4; hct 33.8; mcv 90.9; plt 198; glucose 101; bun 13; creat 1.06; k+ 4.6; na++ 141 liver normal albumin 3.6; chol 101; ldl 51; trig 61; tegretol 8.0  06-21-12: tegretol 12.8 07-10-12: tegretol 9.0 07-24-12: wbc 4.7; hgb 12.4; hct 35.3; mcv 91.2 .plt 189; glucose 134; bun 11; creat 0.98; k+ 4.6; na++ 138  06-07-12: wbc 5.4; hgb 12.0; hct 34.4; mcv 92.0; plt 172; glucose 154; bun 10; creat 0.92; k+ 4.4 Na++ 140; alk phos 114; ast 12; alt 20; albumin 3.6  08-07-12: glucose 149; bun 12; creat 1.04; k+ 4.6;  na++ 140; chol 119; ldl 66; trig 80;  Tegretol 9.8  Review of Systems  Unable to perform ROS    Physical Exam  Constitutional: He appears well-developed and well-nourished.  Neck: Neck supple.  Cardiovascular: Normal rate, regular rhythm and intact distal pulses.   Respiratory: Effort normal and breath sounds normal.  GI: Soft. Bowel sounds are normal. There is no tenderness.  Neurological: He is alert.  Skin: Skin is warm and dry.  Psychiatric: He has a normal mood and affect.       ASSESSMENT/ PLAN:  CVA (cerebral vascular accident) Is stable will continue plavix 75 mg daily   Hemiplegia affecting dominant side, late effect of cerebrovascular disease Is without change will continue baclofen 10 mg three times daily  Depression Is stable will continue celexa 20 mg daily and buspar 15 mg daily for anxiety and  will monitor  Dementia No change in status will continue aricept 10 mg daily and will continue namenda 10 mg twice daily   Seizures Will continue tegretol 300 mg three times daily  Other and unspecified hyperlipidemia Will continue zocor 10 mg daily   Unspecified constipation Will continue miralax daily  Edema Will continue lasix 40 mg daily

## 2012-10-16 NOTE — Assessment & Plan Note (Signed)
Will continue miralax daily  

## 2012-11-14 ENCOUNTER — Non-Acute Institutional Stay (SKILLED_NURSING_FACILITY): Payer: PRIVATE HEALTH INSURANCE | Admitting: Internal Medicine

## 2012-11-14 DIAGNOSIS — I69959 Hemiplegia and hemiparesis following unspecified cerebrovascular disease affecting unspecified side: Secondary | ICD-10-CM

## 2012-11-14 DIAGNOSIS — R569 Unspecified convulsions: Secondary | ICD-10-CM

## 2012-11-14 DIAGNOSIS — F329 Major depressive disorder, single episode, unspecified: Secondary | ICD-10-CM

## 2012-11-14 DIAGNOSIS — F0390 Unspecified dementia without behavioral disturbance: Secondary | ICD-10-CM

## 2012-11-14 DIAGNOSIS — I6350 Cerebral infarction due to unspecified occlusion or stenosis of unspecified cerebral artery: Secondary | ICD-10-CM

## 2012-11-14 DIAGNOSIS — I639 Cerebral infarction, unspecified: Secondary | ICD-10-CM

## 2012-11-14 DIAGNOSIS — K5900 Constipation, unspecified: Secondary | ICD-10-CM

## 2012-11-14 NOTE — Progress Notes (Signed)
Patient ID: Danny Gilbert, male   DOB: 1923-08-20, 79 y.o.   MRN: [id number]   Code Status: full code  No Known Allergies  Chief Complaint: medical management of chronic illness  HPI:  79 y/o male patient is LTC resident of facility. He has hx of CVA with hemiplegia, dementia among others. He remains seizure free. No recent behavioral changes reported. No complaints from patient. No new concerns from staff  Review of Systems  Constitutional: Negative for fever, chills, weight loss, malaise/fatigue and diaphoresis.  HENT: Negative for congestion.   Eyes: Negative for blurred vision.  Respiratory: Negative for shortness of breath.   Cardiovascular: Negative for chest pain.  Gastrointestinal: Negative for nausea, vomiting and abdominal pain.  Genitourinary: Negative for dysuria.  Musculoskeletal: Negative for falls.  Skin: Negative for rash.  Neurological: Negative for dizziness, seizures and headaches.  Psychiatric/Behavioral: Negative for depression. The patient does not have insomnia.        Followed by psych services     Past Medical History  Diagnosis Date  . Hyperlipidemia   . Depression   . Seizures   . Hypokalemia   . Dementia   . Hemiplegia affecting dominant side, late effect of cerebrovascular disease   . Constipation   . Edema    Social History:   reports that he has never smoked. He does not have any smokeless tobacco history on file. His alcohol and drug histories are not on file.  Family History  Problem Relation Age of Onset  . Family history unknown: Yes    Medications: Patient's Medications  New Prescriptions   No medications on file  Previous Medications   BACLOFEN (LIORESAL) 10 MG TABLET    Take 10 mg by mouth 3 (three) times daily.   BUSPIRONE (BUSPAR) 15 MG TABLET    Take 10 mg by mouth 2 (two) times daily.    CARBAMAZEPINE (TEGRETOL) 200 MG TABLET    Take 300 mg by mouth 3 (three) times daily.   CITALOPRAM (CELEXA) 20 MG TABLET    Take 20 mg  by mouth daily.   CLOPIDOGREL (PLAVIX) 75 MG TABLET    Take 75 mg by mouth daily.   DONEPEZIL (ARICEPT) 10 MG TABLET    Take 10 mg by mouth at bedtime.    FUROSEMIDE (LASIX) 40 MG TABLET    Take 40 mg by mouth daily.   MEMANTINE (NAMENDA) 10 MG TABLET    Take 10 mg by mouth 2 (two) times daily.   MULTIPLE VITAMIN (MULTIVITAMIN) TABLET    Take 1 tablet by mouth daily.   POLYETHYL GLYCOL-PROPYL GLYCOL (SYSTANE ULTRA) 0.4-0.3 % SOLN    Apply 1 drop to eye 4 (four) times daily. Both eyes   POLYETHYLENE GLYCOL (MIRALAX / GLYCOLAX) PACKET    Take 17 g by mouth daily.   SIMVASTATIN (ZOCOR) 10 MG TABLET    Take 10 mg by mouth at bedtime.   VITAMIN B-12 (CYANOCOBALAMIN) 100 MCG TABLET    Take 100 mcg by mouth daily.  Modified Medications   No medications on file  Discontinued Medications   No medications on file     Physical Exam:  Filed Vitals:   11/14/12 1659  BP: 115/69  Pulse: 57  Temp: 97.5 F (36.4 C)  Resp: 18  Height: 6' (1.829 m)  Weight: 173 lb (78.472 kg)   Constitutional: He appears well-developed and well-nourished.  Neck: Neck supple.  Cardiovascular: Normal rate, regular rhythm and intact distal pulses.   Respiratory: Effort  normal and breath sounds normal.  GI: Soft. Bowel sounds are normal. There is no tenderness.  Neurological: He is alert. Right sided hemiplegia Skin: Skin is warm and dry.  Psychiatric: He has a normal mood and affect.   Labs reviewed: 11/10/11 wbc 4.5,hgb 11.4; hct 33.8; mcv 90.9; plt 198; glucose 101; bun 13; creat 1.06; k+ 4.6; na++ 141 liver normal albumin 3.6; chol 101; ldl 51; trig 61; tegretol 8.0   06-21-12: tegretol 12.8 07-10-12: tegretol 9.0 07-24-12: wbc 4.7; hgb 12.4; hct 35.3; mcv 91.2 .plt 189; glucose 134; bun 11; creat 0.98; k+ 4.6; na++ 138   06-07-12: wbc 5.4; hgb 12.0; hct 34.4; mcv 92.0; plt 172; glucose 154; bun 10; creat 0.92; k+ 4.4 Na++ 140; alk phos 114; ast 12; alt 20; albumin 3.6   08-07-12: glucose 149; bun 12; creat  1.04; k+ 4.6; na++ 140; chol 119; ldl 66; trig 80;   Tegretol 9.8  Assessment/Plan  CVA (cerebral vascular accident) Is stable will continue plavix 75 mg daily. bp well controlled and continue statin  Hemiplegia affecting dominant side, late effect of cerebrovascular disease Is without change will continue baclofen 10 mg three times daily  Depression Is stable will continue celexa 20 mg daily and buspar 10 mg twice daily for anxiety and will monitor  Dementia No change in status will continue aricept 10 mg daily and will change namenda to 28 mg xr once a day t  Seizures Will continue tegretol 300 mg three times daily and recheck tegretol level in 3 months  Unspecified constipation Will continue miralax daily  Labs/tests ordered- cmp, cbc, flp, tegretol level in 3  months

## 2013-01-10 ENCOUNTER — Non-Acute Institutional Stay (SKILLED_NURSING_FACILITY): Payer: PRIVATE HEALTH INSURANCE | Admitting: Internal Medicine

## 2013-01-10 ENCOUNTER — Encounter: Payer: Self-pay | Admitting: Internal Medicine

## 2013-01-10 DIAGNOSIS — F329 Major depressive disorder, single episode, unspecified: Secondary | ICD-10-CM

## 2013-01-10 DIAGNOSIS — R609 Edema, unspecified: Secondary | ICD-10-CM

## 2013-01-10 DIAGNOSIS — F0150 Vascular dementia without behavioral disturbance: Secondary | ICD-10-CM

## 2013-01-10 DIAGNOSIS — F0280 Dementia in other diseases classified elsewhere without behavioral disturbance: Secondary | ICD-10-CM

## 2013-01-10 DIAGNOSIS — E785 Hyperlipidemia, unspecified: Secondary | ICD-10-CM

## 2013-01-10 DIAGNOSIS — R569 Unspecified convulsions: Secondary | ICD-10-CM

## 2013-01-10 DIAGNOSIS — I6350 Cerebral infarction due to unspecified occlusion or stenosis of unspecified cerebral artery: Secondary | ICD-10-CM

## 2013-01-10 DIAGNOSIS — K5900 Constipation, unspecified: Secondary | ICD-10-CM

## 2013-01-10 DIAGNOSIS — G309 Alzheimer's disease, unspecified: Secondary | ICD-10-CM

## 2013-01-10 DIAGNOSIS — I639 Cerebral infarction, unspecified: Secondary | ICD-10-CM

## 2013-01-10 DIAGNOSIS — F32A Depression, unspecified: Secondary | ICD-10-CM

## 2013-01-10 DIAGNOSIS — I69959 Hemiplegia and hemiparesis following unspecified cerebrovascular disease affecting unspecified side: Secondary | ICD-10-CM

## 2013-01-10 DIAGNOSIS — E538 Deficiency of other specified B group vitamins: Secondary | ICD-10-CM

## 2013-01-10 NOTE — Progress Notes (Signed)
Patient ID: Danny Gilbert, male   DOB: 1923-06-05, 79 y.o.   MRN: [id number]  [name] [place]  Code Status: full code  No Known Allergies  Chief Complaint: annual exam  HPI:   79 y/o male patient is a resident of facility. He has hx of CVA with hemiplegia, dementia and seziure. He has remained seizure free. No falls reported. He is mostly in his room. No recent behavioral changes reported. He has been constipated, no abdominal pain, nausea or vomiting. No complaints from patient. No new concerns from staff  Review of Systems  Constitutional: Negative for fever, chills, weight loss, malaise/fatigue and diaphoresis.  HENT: Negative for congestion.   Eyes: Negative for blurred vision.  Respiratory: Negative for shortness of breath.   Cardiovascular: Negative for chest pain.  Gastrointestinal: Negative for nausea, vomiting and abdominal pain.  Genitourinary: Negative for dysuria.  Musculoskeletal: Negative for falls.  Skin: Negative for rash.  Neurological: Negative for dizziness, seizures and headaches.  Psychiatric/Behavioral: Negative for depression. The patient does not have insomnia.         Followed by psych services    Past Medical History  Diagnosis Date  . Hyperlipidemia   . Depression   . Seizures   . Hypokalemia   . Dementia   . Hemiplegia affecting dominant side, late effect of cerebrovascular disease   . Constipation   . Edema    History reviewed. No pertinent past surgical history. Social History:   reports that he has never smoked. He does not have any smokeless tobacco history on file. His alcohol and drug histories are not on file.  History reviewed. No pertinent family history.  Medications: Patient's Medications  New Prescriptions   No medications on file  Previous Medications   BACLOFEN (LIORESAL) 10 MG TABLET    Take 10 mg by mouth 3 (three) times daily.   BUSPIRONE (BUSPAR) 15 MG TABLET    Take 10 mg by mouth 2 (two) times daily.    CARBAMAZEPINE  (TEGRETOL) 200 MG TABLET    Take 300 mg by mouth 3 (three) times daily.   CITALOPRAM (CELEXA) 20 MG TABLET    Take 20 mg by mouth daily.   CLOPIDOGREL (PLAVIX) 75 MG TABLET    Take 75 mg by mouth daily.   DONEPEZIL (ARICEPT) 10 MG TABLET    Take 10 mg by mouth at bedtime.    FUROSEMIDE (LASIX) 40 MG TABLET    Take 40 mg by mouth daily.   MEMANTINE HCL ER (NAMENDA XR) 28 MG CP24    Take 1 capsule by mouth daily.   MULTIPLE VITAMIN (MULTIVITAMIN) TABLET    Take 1 tablet by mouth daily.   POLYETHYL GLYCOL-PROPYL GLYCOL (SYSTANE ULTRA) 0.4-0.3 % SOLN    Apply 1 drop to eye 4 (four) times daily. Both eyes   POLYETHYLENE GLYCOL (MIRALAX / GLYCOLAX) PACKET    Take 17 g by mouth daily.   SIMVASTATIN (ZOCOR) 10 MG TABLET    Take 10 mg by mouth at bedtime.   VITAMIN B-12 (CYANOCOBALAMIN) 100 MCG TABLET    Take 100 mcg by mouth daily.  Modified Medications   No medications on file  Discontinued Medications   MEMANTINE (NAMENDA) 10 MG TABLET    Take 10 mg by mouth 2 (two) times daily.     Physical Exam: Filed Vitals:   01/10/13 1504  BP: 140/82  Pulse: 76  Temp: 97.3 F (36.3 C)  Resp: 18  Height: 6' (1.829 m)  Weight:  170 lb (77.111 kg)  SpO2: 96%   Constitutional: He appears well-developed and well-nourished.   HEENT: no pallor, no icterus, PERRLA, EOMI, MMM, no lymphadenopathy Neck: Neck supple.   Cardiovascular: Normal rate, regular rhythm and intact distal pulses.    Respiratory: Effort normal and breath sounds normal.   GI: Soft. Bowel sounds are normal. There is no tenderness.  Neurological: He is alert but oriented to person and place. Right sided hemiplegia Skin: Skin is warm and dry.  Psychiatric: He has a normal mood and affect.    Labs reviewed: 11/10/11 wbc 4.5,hgb 11.4; hct 33.8; mcv 90.9; plt 198; glucose 101; bun 13; creat 1.06; k+ 4.6; na++ 141 liver normal albumin 3.6; chol 101; ldl 51; trig 61; tegretol 8.0   06-21-12: tegretol 12.8 07-10-12: tegretol 9.0 07-24-12:  wbc 4.7; hgb 12.4; hct 35.3; mcv 91.2 .plt 189; glucose 134; bun 11; creat 0.98; k+ 4.6; na++ 138   06-07-12: wbc 5.4; hgb 12.0; hct 34.4; mcv 92.0; plt 172; glucose 154; bun 10; creat 0.92; k+ 4.4 Na++ 140; alk phos 114; ast 12; alt 20; albumin 3.6   08-07-12: glucose 149; bun 12; creat 1.04; k+ 4.6; na++ 140; chol 119; ldl 66; trig 80;   Tegretol 9.8  Assessment/Plan  Depression continue celexa 20 mg daily and buspar 10 mg twice daily and monitor. Followed by psych services  Dementia No change in status. will continue aricept 10 mg daily and namenda 28 mg xr daily. Fall precautions, pressure ulcer prophylaxis, po intake monitoring  CVA (cerebral vascular accident) Symptoms under control. Has residual weakness. Continue plavix 75 mg daily and statin. bp well controlled. Continue baclofen prn for muscle spasm. Continue lasix. Check bmp  Hemiplegia affecting dominant side, late effect of cerebrovascular disease Is without change. continue baclofen 10 mg three times daily  Seizures Remains seizure free. Continue tegretol 300 mg three times daily   Unspecified constipation Will continue miralax daily but with it not helping much, will also add senna s 1 tab bid for now.  b12 def Continue vitamin b12 supplement. Check b12 level  Labs/tests ordered- cmp, cbc, flp, tegretol level in 3  months

## 2013-02-07 ENCOUNTER — Non-Acute Institutional Stay (SKILLED_NURSING_FACILITY): Payer: PRIVATE HEALTH INSURANCE | Admitting: Internal Medicine

## 2013-02-07 DIAGNOSIS — F0150 Vascular dementia without behavioral disturbance: Secondary | ICD-10-CM

## 2013-02-07 DIAGNOSIS — I6350 Cerebral infarction due to unspecified occlusion or stenosis of unspecified cerebral artery: Secondary | ICD-10-CM

## 2013-02-07 DIAGNOSIS — R569 Unspecified convulsions: Secondary | ICD-10-CM

## 2013-02-07 DIAGNOSIS — I639 Cerebral infarction, unspecified: Secondary | ICD-10-CM

## 2013-02-07 DIAGNOSIS — K5900 Constipation, unspecified: Secondary | ICD-10-CM

## 2013-02-07 DIAGNOSIS — F0280 Dementia in other diseases classified elsewhere without behavioral disturbance: Secondary | ICD-10-CM

## 2013-02-07 DIAGNOSIS — M62838 Other muscle spasm: Secondary | ICD-10-CM | POA: Insufficient documentation

## 2013-02-07 DIAGNOSIS — F329 Major depressive disorder, single episode, unspecified: Secondary | ICD-10-CM

## 2013-02-07 NOTE — Progress Notes (Signed)
Patient ID: Danny Gilbert, male   DOB: 01/08/1924, 79 y.o.   MRN: [id number]  [name]  Code Status: full code  No Known Allergies  Chief Complaint: routine visit  HPI:   79 y/o male patient is seen for routine follow up. No recent seizure reported. No falls reported. He denies any complaints. No recent behavioral changes reported. No new concerns from staff  Review of Systems   Constitutional: Negative for fever, chills, weight loss, malaise/fatigue and diaphoresis.   HENT: Negative for congestion.    Eyes: Negative for blurred vision.   Respiratory: Negative for shortness of breath.    Cardiovascular: Negative for chest pain.   Gastrointestinal: Negative for nausea, vomiting and abdominal pain.   Genitourinary: Negative for dysuria.   Musculoskeletal: Negative for falls.   Skin: Negative for rash.   Neurological: Negative for dizziness, seizures and headaches.   Psychiatric/Behavioral: Negative for depression. The patient does not have insomnia.         Followed by psych services   Past Medical History  Diagnosis Date  . Hyperlipidemia   . Depression   . Seizures   . Hypokalemia   . Dementia   . Hemiplegia affecting dominant side, late effect of cerebrovascular disease   . Constipation   . Edema    Current Outpatient Prescriptions on File Prior to Visit  Medication Sig Dispense Refill  . baclofen (LIORESAL) 10 MG tablet Take 10 mg by mouth 3 (three) times daily.      . busPIRone (BUSPAR) 15 MG tablet Take 10 mg by mouth 2 (two) times daily.       . carbamazepine (TEGRETOL) 200 MG tablet Take 300 mg by mouth 3 (three) times daily.      . citalopram (CELEXA) 20 MG tablet Take 20 mg by mouth daily.      . clopidogrel (PLAVIX) 75 MG tablet Take 75 mg by mouth daily.      Marland Kitchen donepezil (ARICEPT) 10 MG tablet Take 10 mg by mouth at bedtime.       . furosemide (LASIX) 40 MG tablet Take 40 mg by mouth daily.      . Memantine HCl ER (NAMENDA XR) 28 MG CP24 Take 1 capsule  by mouth daily.      . Multiple Vitamin (MULTIVITAMIN) tablet Take 1 tablet by mouth daily.      Bertram Gala Glycol-Propyl Glycol (SYSTANE ULTRA) 0.4-0.3 % SOLN Apply 1 drop to eye 4 (four) times daily. Both eyes      . polyethylene glycol (MIRALAX / GLYCOLAX) packet Take 17 g by mouth daily.      . simvastatin (ZOCOR) 10 MG tablet Take 10 mg by mouth at bedtime.      . vitamin B-12 (CYANOCOBALAMIN) 100 MCG tablet Take 100 mcg by mouth daily.       No current facility-administered medications on file prior to visit.    BP 140/74  Pulse 84  Temp(Src) 97.3 F (36.3 C)  Resp 16  SpO2 96%  Constitutional: He appears well-developed and well-nourished.   HEENT: no pallor, no icterus, PERRLA, EOMI, MMM, no lymphadenopathy Neck: Neck supple.   Cardiovascular: Normal rate, regular rhythm and intact distal pulses.    Respiratory: Effort normal and breath sounds normal.   GI: Soft. Bowel sounds are normal. There is no tenderness.  Neurological: He is alert but oriented to person and place. Right sided hemiplegia Skin: Skin is warm and dry.  Psychiatric: He has a normal mood and  affect.    Labs reviewed:  11/10/11 wbc 4.5,hgb 11.4; hct 33.8; mcv 90.9; plt 198; glucose 101; bun 13; creat 1.06; k+ 4.6; na++ 141 liver normal albumin 3.6; chol 101; ldl 51; trig 61; tegretol 8.0   06-21-12: tegretol 12.8 07-10-12: tegretol 9.0 07-24-12: wbc 4.7; hgb 12.4; hct 35.3; mcv 91.2 .plt 189; glucose 134; bun 11; creat 0.98; k+ 4.6; na++ 138   06-07-12: wbc 5.4; hgb 12.0; hct 34.4; mcv 92.0; plt 172; glucose 154; bun 10; creat 0.92; k+ 4.4 Na++ 140; alk phos 114; ast 12; alt 20; albumin 3.6   08-07-12: glucose 149; bun 12; creat 1.04; k+ 4.6; na++ 140; chol 119; ldl 66; trig 80;   Tegretol 9.8  01/13/13 wbc 5.4, hb 13.7, hct 39.5, plt 192, na 138, k 4.3, glu 154, bun 17, cr 1.10, alp 121, lft wnl otherwise, t.chol 143, tg 131, hdl 43, ldl 74, b12 937, tegretol 16.1, vit d 42  Assessment/Plan  Vascular  Dementia No change in status. will continue aricept 10 mg daily and namenda 28 mg xr daily. Fall precautions, pressure ulcer prophylaxis  Depression continue celexa 20 mg daily and buspar 10 mg twice daily and monitor. Followed by psych services  Seizures Remains seizure free. Continue tegretol 300 mg three times daily    CVA (cerebral vascular accident) Has residual weakness. Continue plavix 75 mg daily and statin. bp well controlled.   Muscle spasticity Continue baclofen tid for muscle spasm.   Unspecified constipation Continue senna s and miralax daily - improved  Labs/tests ordered- none

## 2013-03-12 ENCOUNTER — Non-Acute Institutional Stay (SKILLED_NURSING_FACILITY): Payer: PRIVATE HEALTH INSURANCE | Admitting: Adult Health

## 2013-03-12 DIAGNOSIS — F329 Major depressive disorder, single episode, unspecified: Secondary | ICD-10-CM

## 2013-03-12 DIAGNOSIS — R609 Edema, unspecified: Secondary | ICD-10-CM

## 2013-03-12 DIAGNOSIS — I639 Cerebral infarction, unspecified: Secondary | ICD-10-CM

## 2013-03-12 DIAGNOSIS — I6350 Cerebral infarction due to unspecified occlusion or stenosis of unspecified cerebral artery: Secondary | ICD-10-CM

## 2013-03-12 DIAGNOSIS — E785 Hyperlipidemia, unspecified: Secondary | ICD-10-CM

## 2013-03-12 DIAGNOSIS — F0280 Dementia in other diseases classified elsewhere without behavioral disturbance: Secondary | ICD-10-CM

## 2013-03-12 DIAGNOSIS — R569 Unspecified convulsions: Secondary | ICD-10-CM

## 2013-03-12 DIAGNOSIS — K5900 Constipation, unspecified: Secondary | ICD-10-CM

## 2013-03-12 DIAGNOSIS — I69959 Hemiplegia and hemiparesis following unspecified cerebrovascular disease affecting unspecified side: Secondary | ICD-10-CM

## 2013-03-12 DIAGNOSIS — F0150 Vascular dementia without behavioral disturbance: Secondary | ICD-10-CM

## 2013-03-13 ENCOUNTER — Encounter: Payer: Self-pay | Admitting: Adult Health

## 2013-03-13 NOTE — Assessment & Plan Note (Signed)
He is on very low dose zocor 10 mg daily; due to his advanced age he is more than likely not receiving any benefit from this medication; will stop the zocor at this time and will monitor his status

## 2013-03-13 NOTE — Assessment & Plan Note (Signed)
He is without significant change in status; will continue his aricept 10 mg daily and namenda xr 28 mg daily

## 2013-03-13 NOTE — Assessment & Plan Note (Signed)
He is stable will continue his celexa 20 mg daily and buspar 10 mg twice daily for anxiety and will monitor

## 2013-03-13 NOTE — Assessment & Plan Note (Signed)
He remains stable will continue his plavix 75 mg daily and will continue baclofen 10 mg three times daily for spasticity and will monitor his status

## 2013-03-13 NOTE — Progress Notes (Signed)
Patient ID: Danny Gilbert, male   DOB: [birth year], 79 y.o.   MRN: [id number]  [facility]  No Known Allergies   Chief Complaint  Patient presents with  . Medical Managment of Chronic Issues    HPI: He is being seen for the management of chronic illnesses. There are no concerns being voiced today by the nursing staff. He cannot fully participate in the hpi or ros; but states he is feeling good. There has no been no change in his status.   Past Medical History  Diagnosis Date  . Hyperlipidemia   . Depression   . Seizures   . Hypokalemia   . Dementia   . Hemiplegia affecting dominant side, late effect of cerebrovascular disease   . Constipation   . Edema     No past surgical history on file.  VITAL SIGNS BP 131/54  Pulse 85  Ht 6' (1.829 m)  Wt 172 lb (78.019 kg)  BMI 23.32 kg/m2   Patient's Medications  New Prescriptions   No medications on file  Previous Medications   BACLOFEN (LIORESAL) 10 MG TABLET    Take 10 mg by mouth 3 (three) times daily.   BUSPIRONE (BUSPAR) 15 MG TABLET    Take 10 mg by mouth 2 (two) times daily.    CARBAMAZEPINE (TEGRETOL) 200 MG TABLET    Take 200 mg by mouth 2 (two) times daily. TAKE 300 MG IN THE AM 200 MG AT NOON AND 300 MG NIGHTLY   CITALOPRAM (CELEXA) 20 MG TABLET    Take 20 mg by mouth daily.   CLOPIDOGREL (PLAVIX) 75 MG TABLET    Take 75 mg by mouth daily.   DONEPEZIL (ARICEPT) 10 MG TABLET    Take 10 mg by mouth at bedtime.    FUROSEMIDE (LASIX) 40 MG TABLET    Take 40 mg by mouth daily.   MEMANTINE HCL ER (NAMENDA XR) 28 MG CP24    Take 1 capsule by mouth daily.   MULTIPLE VITAMIN (MULTIVITAMIN) TABLET    Take 1 tablet by mouth daily.   POLYETHYL GLYCOL-PROPYL GLYCOL (SYSTANE ULTRA) 0.4-0.3 % SOLN    Apply 1 drop to eye 4 (four) times daily. Both eyes   POLYETHYLENE GLYCOL (MIRALAX / GLYCOLAX) PACKET    Take 17 g by mouth daily.   SENNOSIDES-DOCUSATE SODIUM (SENOKOT-S) 8.6-50 MG TABLET    Take 1 tablet by mouth 2 (two) times  daily.   SIMVASTATIN (ZOCOR) 10 MG TABLET    Take 10 mg by mouth at bedtime.   VITAMIN B-12 (CYANOCOBALAMIN) 100 MCG TABLET    Take 100 mcg by mouth daily.  Modified Medications   No medications on file  Discontinued Medications   No medications on file    SIGNIFICANT DIAGNOSTIC EXAMS   04-19-10: right upper extremity doppler: negative for dvt  06-02-10: mri of head: 1) evolution of a left basal ganglia and corona radiata infarct (2) otherwise stable basal ganglia infarcts and extensive white matter disease (3) stable cerebellar infarcts (4) no acute intracranial abnormality (5) right maxillary and bilateral frontal ethmoid mucosal thickening.   LABS REVIEWED:  06-21-12: tegretol 12.8 07-10-12: tegretol 9.0 07-24-12: wbc 4.7; hgb 12.4; hct 35.3; mcv 91.2 .plt 189; glucose 134; bun 11; creat 0.98; k+ 4.6; na++ 138  06-07-12: wbc 5.4; hgb 12.0; hct 34.4; mcv 92.0; plt 172; glucose 154; bun 10; creat 0.92; k+ 4.4 Na++ 140; alk phos 114; ast 12; alt 20; albumin 3.6  08-07-12: glucose 149; bun 12; creat  1.04; k+ 4.6; na++ 140; chol 119; ldl 66; trig 80;  Tegretol 9.8 01-13-13: wbc 5.4; hgb 13.7; hct 39.5; mcv 96.6; plt 192; glucose 151; bun 17; creat 1.10; k+4.3; na++138; liver normal albumin 4.4; chol 1143; ldl 74; trig 43; vit b12: 086; tegretol 10.1; vit d 42 02-14-13: chol 153; ldl 92; trig 81; tegretol 57.8 02-21-13: tegretol 13.7       Review of Systems  Unable to perform ROS   Physical Exam  Constitutional:  thin  Neck: Neck supple.  Cardiovascular: Normal rate, regular rhythm and intact distal pulses.   Respiratory: Effort normal and breath sounds normal. No respiratory distress. He has no wheezes.  GI: Soft. Bowel sounds are normal. He exhibits no distension. There is no tenderness.  Musculoskeletal: He exhibits no edema.  Is able to move all extremities  Lymphadenopathy:    He has no cervical adenopathy.  Neurological: He is alert.  Skin: Skin is warm and dry.       ASSESSMENT/ PLAN:  CVA (cerebral vascular accident) He remains stable will continue his plavix 75 mg daily and will continue baclofen 10 mg three times daily for spasticity and will monitor his status   Hemiplegia affecting dominant side, late effect of cerebrovascular disease He is without change will continue baclofen 10 mg three times daily for spasticity   Seizures No reports of seizure activity present will continue tegretol 300 mg in the am 200 mg at noon and 300 mg nightly will continue to monitor his status   Depression He is stable will continue his celexa 20 mg daily and buspar 10 mg twice daily for anxiety and will monitor   Edema Is stable will continue lasix 40 mg daily and will continue to monitor   Other and unspecified hyperlipidemia He is on very low dose zocor 10 mg daily; due to his advanced age he is more than likely not receiving any benefit from this medication; will stop the zocor at this time and will monitor his status   Mixed Alzheimer's and vascular dementia He is without significant change in status; will continue his aricept 10 mg daily and namenda xr 28 mg daily   Unspecified constipation Will continue senna s daily and miralax daily

## 2013-03-13 NOTE — Assessment & Plan Note (Signed)
Is stable will continue lasix 40 mg daily and will continue to monitor

## 2013-03-13 NOTE — Assessment & Plan Note (Signed)
Will continue senna s daily and miralax daily

## 2013-03-13 NOTE — Assessment & Plan Note (Signed)
He is without change will continue baclofen 10 mg three times daily for spasticity

## 2013-03-13 NOTE — Assessment & Plan Note (Signed)
No reports of seizure activity present will continue tegretol 300 mg in the am 200 mg at noon and 300 mg nightly will continue to monitor his status

## 2013-05-12 ENCOUNTER — Non-Acute Institutional Stay (SKILLED_NURSING_FACILITY): Payer: PRIVATE HEALTH INSURANCE | Admitting: Internal Medicine

## 2013-05-12 ENCOUNTER — Encounter: Payer: Self-pay | Admitting: Internal Medicine

## 2013-05-12 DIAGNOSIS — F0280 Dementia in other diseases classified elsewhere without behavioral disturbance: Secondary | ICD-10-CM

## 2013-05-12 DIAGNOSIS — F329 Major depressive disorder, single episode, unspecified: Secondary | ICD-10-CM

## 2013-05-12 DIAGNOSIS — I639 Cerebral infarction, unspecified: Secondary | ICD-10-CM

## 2013-05-12 DIAGNOSIS — F0150 Vascular dementia without behavioral disturbance: Secondary | ICD-10-CM

## 2013-05-12 DIAGNOSIS — I6350 Cerebral infarction due to unspecified occlusion or stenosis of unspecified cerebral artery: Secondary | ICD-10-CM

## 2013-05-12 DIAGNOSIS — R569 Unspecified convulsions: Secondary | ICD-10-CM

## 2013-05-12 NOTE — Progress Notes (Signed)
Patient ID: Danny Gilbert, male   DOB: 25-Aug-1923, 79 y.o.   MRN: [id number]    [name] [place]  CC- medical management of chronic illness  No Known Allergies  HPI 79 y/o male patient is seen for routine follow up. No new concerns from staff. No recent behavioral changes reported.  Review of Systems   Constitutional: Negative for fever, chills, weight loss, malaise/fatigue and diaphoresis.   HENT: Negative for congestion.    Eyes: Negative for blurred vision.   Respiratory: Negative for shortness of breath.    Cardiovascular: Negative for chest pain.   Gastrointestinal: Negative for nausea, vomiting and abdominal pain.   Genitourinary: Negative for dysuria.   Musculoskeletal: Negative for falls.   Skin: Negative for rash.   Neurological: Negative for dizziness, seizures and headaches.   Psychiatric/Behavioral: Negative for depression. The patient does not have insomnia. Followed by psych services      Past Medical History  Diagnosis Date  . Hyperlipidemia   . Depression   . Seizures   . Hypokalemia   . Dementia   . Hemiplegia affecting dominant side, late effect of cerebrovascular disease   . Constipation   . Edema    Medication reviewed. See [facility]  Physical exam BP 135/74  Pulse 88  Temp(Src) 97.9 F (36.6 C)  Resp 16  Ht 6' (1.829 m)  Wt 168 lb (76.204 kg)  BMI 22.78 kg/m2  SpO2 96%  Constitutional: He appears well-developed and well-nourished.   HEENT: no pallor, no icterus, PERRLA, EOMI, MMM, no lymphadenopathy Neck: Neck supple.   Cardiovascular: Normal rate, regular rhythm and intact distal pulses.    Respiratory: Effort normal and breath sounds normal.   GI: Soft. Bowel sounds are normal. There is no tenderness.  Neurological: He is alert but oriented to person and place. Right sided hemiplegia Skin: Skin is warm and dry.  Psychiatric: He has a normal mood and affect.   Labs reviewed:  11/10/11 wbc 4.5,hgb 11.4; hct 33.8; mcv 90.9; plt 198;  glucose 101; bun 13; creat 1.06; k+ 4.6; na++ 141 liver normal albumin 3.6; chol 101; ldl 51; trig 61; tegretol 8.0   06-21-12: tegretol 12.8 07-10-12: tegretol 9.0 07-24-12: wbc 4.7; hgb 12.4; hct 35.3; mcv 91.2 .plt 189; glucose 134; bun 11; creat 0.98; k+ 4.6; na++ 138   06-07-12: wbc 5.4; hgb 12.0; hct 34.4; mcv 92.0; plt 172; glucose 154; bun 10; creat 0.92; k+ 4.4 Na++ 140; alk phos 114; ast 12; alt 20; albumin 3.6   08-07-12: glucose 149; bun 12; creat 1.04; k+ 4.6; na++ 140; chol 119; ldl 66; trig 80;   Tegretol 9.8 01/13/13 wbc 5.4, hb 13.7, hct 39.5, plt 192, na 138, k 4.3, glu 154, bun 17, cr 1.10, alp 121, lft wnl otherwise, t.chol 143, tg 131, hdl 43, ldl 74, b12 937, tegretol 40.9, vit d 42 03/10/13 carbamezapine 6.3, wbc 7.3, hb 12.6, hct 35, plt 181  Assessment/Plan  CVA  Has residual weakness. Continue plavix 75 mg daily and statin. bp well controlled. Continue baclofen for muscle spasticity  Vascular Dementia No change in status. will continue aricept 10 mg daily and namenda 28 mg xr daily. Fall precautions, pressure ulcer prophylaxis  Seizures Remains seizure free. Continue tegretol 300 mg bid with 200 mg in the afternoon  Depression continue celexa 20 mg daily and buspar 10 mg twice daily and monitor.

## 2013-06-06 ENCOUNTER — Encounter: Payer: Self-pay | Admitting: Internal Medicine

## 2013-06-06 ENCOUNTER — Non-Acute Institutional Stay (SKILLED_NURSING_FACILITY): Payer: PRIVATE HEALTH INSURANCE | Admitting: Internal Medicine

## 2013-06-06 DIAGNOSIS — K5900 Constipation, unspecified: Secondary | ICD-10-CM

## 2013-06-06 DIAGNOSIS — F0280 Dementia in other diseases classified elsewhere without behavioral disturbance: Secondary | ICD-10-CM

## 2013-06-06 DIAGNOSIS — R569 Unspecified convulsions: Secondary | ICD-10-CM

## 2013-06-06 DIAGNOSIS — F32A Depression, unspecified: Secondary | ICD-10-CM

## 2013-06-06 DIAGNOSIS — R609 Edema, unspecified: Secondary | ICD-10-CM

## 2013-06-06 DIAGNOSIS — F3289 Other specified depressive episodes: Secondary | ICD-10-CM

## 2013-06-06 DIAGNOSIS — F0150 Vascular dementia without behavioral disturbance: Secondary | ICD-10-CM

## 2013-06-06 DIAGNOSIS — G309 Alzheimer's disease, unspecified: Principal | ICD-10-CM

## 2013-06-06 DIAGNOSIS — F329 Major depressive disorder, single episode, unspecified: Secondary | ICD-10-CM

## 2013-06-06 DIAGNOSIS — I6350 Cerebral infarction due to unspecified occlusion or stenosis of unspecified cerebral artery: Secondary | ICD-10-CM

## 2013-06-06 DIAGNOSIS — I639 Cerebral infarction, unspecified: Secondary | ICD-10-CM

## 2013-06-06 DIAGNOSIS — M62838 Other muscle spasm: Secondary | ICD-10-CM

## 2013-06-06 NOTE — Progress Notes (Signed)
Patient ID: Danny Gilbert, male   DOB: [birth year], 80 y.o.   MRN: [id number]    Danny Gilbert living [facility]  No Known Allergies  Chief Complaint: routine visit  HPI:   80 y/o male patient is seen for routine follow up.  No new concerns from staff. No recent seizure reported. No falls reported. He denies any complaints. No recent behavioral changes reported.  Review of Systems   Constitutional: Negative for fever, chills, weight loss, malaise/fatigue and diaphoresis.   HENT: Negative for congestion.    Eyes: Negative for blurred vision.   Respiratory: Negative for shortness of breath.    Cardiovascular: Negative for chest pain.   Gastrointestinal: Negative for nausea, vomiting and abdominal pain.   Genitourinary: Negative for dysuria.   Musculoskeletal: Negative for falls.   Skin: Negative for rash.   Neurological: Negative for dizziness, seizures and headaches.   Psychiatric/Behavioral: Negative for depression. The patient does not have insomnia.         Followed by psych services     Past Medical History  Diagnosis Date  . Hyperlipidemia   . Depression   . Seizures   . Hypokalemia   . Dementia   . Hemiplegia affecting dominant side, late effect of cerebrovascular disease   . Constipation   . Edema    Medication reviewed. See [facility]  Physical exam BP 132/68  Pulse 71  Temp(Src) 97.1 F (36.2 C)  Resp 20  Ht 6' (1.829 m)  Wt 170 lb (77.111 kg)  BMI 23.05 kg/m2  SpO2 96%  Constitutional: He appears well-developed and well-nourished.   HEENT: no pallor, no icterus, PERRLA, EOMI, MMM, no lymphadenopathy Neck: Neck supple.   Cardiovascular: Normal rate, regular rhythm and intact distal pulses.    Respiratory: Effort normal and breath sounds normal.   GI: Soft. Bowel sounds are normal. There is no tenderness.  Musculoskeletal: wheelchair bound, weakness of lower extremities Neurological: He is alert but oriented to person and place. Right sided hemiplegia Skin: Skin is  warm and dry.  Psychiatric: He has a normal mood and affect.    Labs reviewed:  11/10/11 wbc 4.5,hgb 11.4; hct 33.8; mcv 90.9; plt 198; glucose 101; bun 13; creat 1.06; k+ 4.6; na++ 141 liver normal albumin 3.6; chol 101; ldl 51; trig 61; tegretol 8.0   06-21-12: tegretol 12.8 07-10-12: tegretol 9.0 07-24-12: wbc 4.7; hgb 12.4; hct 35.3; mcv 91.2 .plt 189; glucose 134; bun 11; creat 0.98; k+ 4.6; na++ 138   06-07-12: wbc 5.4; hgb 12.0; hct 34.4; mcv 92.0; plt 172; glucose 154; bun 10; creat 0.92; k+ 4.4 Na++ 140; alk phos 114; ast 12; alt 20; albumin 3.6   08-07-12: glucose 149; bun 12; creat 1.04; k+ 4.6; na++ 140; chol 119; ldl 66; trig 80;   Tegretol 9.8  01/13/13 wbc 5.4, hb 13.7, hct 39.5, plt 192, na 138, k 4.3, glu 154, bun 17, cr 1.10, alp 121, lft wnl otherwise, t.chol 143, tg 131, hdl 43, ldl 74, b12 937, tegretol 10.1, vit d 42  Assessment/Plan  Vascular Dementia Stable. No behavior changes. will continue aricept 10 mg daily and namenda 28 mg xr daily. Fall precautions, pressure ulcer prophylaxis. bp monitoring. Continue mood medication  CVA (cerebral vascular accident) Has residual weakness. Continue plavix 75 mg daily. bp well controlled.   Muscle spasticity Continue baclofen bid for muscle spasm. Tolerating this change well  Depression continue celexa 20 mg daily and buspar 10 mg twice daily and monitor. Followed by psych services  Seizures Remains seizure free. Continue carbamezapine 300 mg bid and 200 mg once a day. Seizure prophylaxis  Edema Stable on lasix 40 mg daily, monitor bmp  Unspecified constipation Continue senna s and miralax daily

## 2013-07-04 ENCOUNTER — Non-Acute Institutional Stay (SKILLED_NURSING_FACILITY): Payer: PRIVATE HEALTH INSURANCE | Admitting: Internal Medicine

## 2013-07-04 DIAGNOSIS — G309 Alzheimer's disease, unspecified: Secondary | ICD-10-CM

## 2013-07-04 DIAGNOSIS — R609 Edema, unspecified: Secondary | ICD-10-CM

## 2013-07-04 DIAGNOSIS — M79609 Pain in unspecified limb: Secondary | ICD-10-CM

## 2013-07-04 DIAGNOSIS — F0280 Dementia in other diseases classified elsewhere without behavioral disturbance: Secondary | ICD-10-CM

## 2013-07-04 DIAGNOSIS — M79604 Pain in right leg: Secondary | ICD-10-CM

## 2013-07-04 DIAGNOSIS — F0150 Vascular dementia without behavioral disturbance: Secondary | ICD-10-CM

## 2013-07-04 DIAGNOSIS — K5900 Constipation, unspecified: Secondary | ICD-10-CM

## 2013-07-04 DIAGNOSIS — M62838 Other muscle spasm: Secondary | ICD-10-CM

## 2013-07-04 NOTE — Progress Notes (Signed)
Patient ID: Danny Gilbert, male   DOB: 25-Feb-1924, 80 y.o.   MRN: [id number]    Danny Gilbert living [facility] Chief Complaint  Patient presents with  . Medical Managment of Chronic Issues   No Known Allergies  HPI 80 y/o male patient is seen for routine follow up. he has been getting tired easily these days. He needs help with feeding these days. He is refusing to stand, has blister on his foot and has been complaining of pain in right leg with movement. No falls reported. Gained 4 pounds. Remains afebrile. Has demnetia. No recent seizure reported.   Review of Systems   Constitutional: Negative for fever, chills, weight loss, malaise/fatigue and diaphoresis.   HENT: Negative for congestion.    Eyes: Negative for blurred vision.   Respiratory: Negative for shortness of breath.    Cardiovascular: Negative for chest pain.   Gastrointestinal: Negative for nausea, vomiting and abdominal pain.   Genitourinary: Negative for dysuria.   Musculoskeletal: Negative for falls.   Skin: Negative for rash.   Neurological: Negative for dizziness, seizures and headaches.   Psychiatric/Behavioral: Negative for depression. The patient does not have insomnia. Followed by psych services    Past Medical History  Diagnosis Date  . Hyperlipidemia   . Depression   . Seizures   . Hypokalemia   . Dementia   . Hemiplegia affecting dominant side, late effect of cerebrovascular disease   . Constipation   . Edema    No past surgical history on file. Current Outpatient Prescriptions on File Prior to Visit  Medication Sig Dispense Refill  . baclofen (Danny Gilbert) 10 MG tablet Take 10 mg by mouth 3 (three) times daily.      . busPIRone (Danny Gilbert) 15 MG tablet Take 10 mg by mouth 2 (two) times daily.       . carbamazepine (Danny Gilbert) 200 MG tablet Take 200 mg by mouth 2 (two) times daily. TAKE 300 MG IN THE AM 200 MG AT NOON AND 300 MG NIGHTLY      . citalopram (Danny Gilbert) 20 MG tablet Take 20 mg by mouth daily.      .  clopidogrel (Danny Gilbert) 75 MG tablet Take 75 mg by mouth daily.      Marland Kitchen donepezil (Danny Gilbert) 10 MG tablet Take 10 mg by mouth at bedtime.       . furosemide (Danny Gilbert) 40 MG tablet Take 40 mg by mouth daily.      . Memantine HCl ER (Danny Gilbert) 28 MG CP24 Take 1 capsule by mouth daily.      . Multiple Vitamin (MULTIVITAMIN) tablet Take 1 tablet by mouth daily.      Danny Gilbert Danny Gilbert (Danny Gilbert) 0.4-0.3 % SOLN Apply 1 drop to eye 4 (four) times daily. Both eyes      . polyethylene Gilbert (Danny Gilbert) packet Take 17 g by mouth daily.      . sennosides-docusate sodium (Danny Gilbert) 8.6-50 MG tablet Take 1 tablet by mouth 2 (two) times daily.       No current facility-administered medications on file prior to visit.    Physical exam BP 140/60  Pulse 72  Temp(Src) 97 F (36.1 C)  Resp 18  SpO2 95%  Constitutional: He appears well-developed and well-nourished.   HEENT: no pallor, no icterus, PERRLA, EOMI, MMM, no lymphadenopathy Neck: Neck supple.   Cardiovascular: Normal rate, regular rhythm and intact distal pulses. trace leg edema Respiratory: Effort normal and breath sounds normal.   GI: Soft. Bowel sounds  are normal. There is no tenderness.   Musculoskeletal: wheelchair bound, weakness of lower extremities, tenderness in hip/groin area on internal rotation and elevation of right leg Neurological: He is alert but oriented to person and place. Right sided hemiplegia Skin: Skin is warm and dry. Unable to appreciate a blister on his foot Psychiatric: He has a normal mood and affect.    Labs reviewed:  11/10/11 wbc 4.5,hgb 11.4; hct 33.8; mcv 90.9; plt 198; glucose 101; bun 13; creat 1.06; k+ 4.6; na++ 141 liver normal albumin 3.6; chol 101; ldl 51; trig 61; Danny Gilbert 8.0   06-21-12: Danny Gilbert 12.8 07-10-12: Danny Gilbert 9.0 07-24-12: wbc 4.7; hgb 12.4; hct 35.3; mcv 91.2 .plt 189; glucose 134; bun 11; creat 0.98; k+ 4.6; na++ 138   06-07-12: wbc 5.4; hgb 12.0; hct 34.4; mcv  92.0; plt 172; glucose 154; bun 10; creat 0.92; k+ 4.4 Na++ 140; alk phos 114; ast 12; alt 20; albumin 3.6   08-07-12: glucose 149; bun 12; creat 1.04; k+ 4.6; na++ 140; chol 119; ldl 66; trig 80;   Danny Gilbert 9.8 01/13/13 wbc 5.4, hb 13.7, hct 39.5, plt 192, na 138, k 4.3, glu 154, bun 17, cr 1.10, alp 121, lft wnl otherwise, t.chol 143, tg 131, hdl 43, ldl 74, b12 937, Danny Gilbert 10.1, vit d 42 9/14 13.7 03/05/13 wbc 7.3, hb 12.6, plt 181 05/15/13 na 142, k 4.2, glu 198, bun 15, cr 1, ca 9.5, tsh 1.97, b12 1032, folate 15.3  Assessment/Plan  Right leg pain- unable to stand. Concern for possible fracture. Will get pelvis xray to rule out fracture.also get xray right femur. Non weight bearing until fracture ruled out. Will have him on tramadol 50 mg every 12 hours for pain for 5 days and re-evaluate  mixed Dementia Stable. No behavior changes. will continue Danny Gilbert 10 mg daily and Danny 28 mg Gilbert daily. Fall precautions, pressure ulcer prophylaxis. bp monitoring. Continue mood medication  Depression continue Danny Gilbert 20 mg daily and Danny Gilbert 10 mg twice daily and monitor. Followed by psych services  Seizures Remains seizure free. Continue carbamezapine 300 mg bid and 200 mg once a day. Seizure prophylaxis  CVA (cerebral vascular accident) Has residual weakness. Continue Danny Gilbert 75 mg daily. bp well controlled.   Muscle spasticity Continue baclofen bid for muscle spasm. Tolerating this change well  Edema Stable on Danny Gilbert 40 mg daily, monitor bmp  Unspecified constipation Continue senna s and Danny daily  D/c multivitamin

## 2013-07-11 ENCOUNTER — Non-Acute Institutional Stay (SKILLED_NURSING_FACILITY): Payer: PRIVATE HEALTH INSURANCE | Admitting: Internal Medicine

## 2013-07-11 DIAGNOSIS — E119 Type 2 diabetes mellitus without complications: Secondary | ICD-10-CM

## 2013-07-11 DIAGNOSIS — R5381 Other malaise: Secondary | ICD-10-CM

## 2013-07-11 DIAGNOSIS — M25559 Pain in unspecified hip: Secondary | ICD-10-CM

## 2013-07-11 DIAGNOSIS — R5383 Other fatigue: Secondary | ICD-10-CM

## 2013-07-11 DIAGNOSIS — M25551 Pain in right hip: Secondary | ICD-10-CM

## 2013-07-11 NOTE — Progress Notes (Signed)
Patient ID: Danny Gilbert, male   DOB: 1924/03/06, 80 y.o.   MRN: [id number]    Danny Gilbert living [facility]  Chief Complaint  Patient presents with  . Acute Visit    hyperglycemia, lethargy   No Known Allergies  HPI 80 y/o male patient is seen for acute visit. He has been lethargic for few days and on routine sugar check he was noted to have elevated blood sugar level. He then had a1c checked and resulted to be 10.5 today. He converses minimally and it is difficult to obtain ROS from him with his dementia and lethargy limiting it. He had xray of right hip which has ruled out fracture. reviewed the result  Review of Systems   From staff Remains afebrile Poor po intake No seizure reported No falls No skin concern  Past Medical History  Diagnosis Date  . Hyperlipidemia   . Depression   . Seizures   . Hypokalemia   . Dementia   . Hemiplegia affecting dominant side, late effect of cerebrovascular disease   . Constipation   . Edema    Medication reviewed. See [facility]  Physical exam BP 132/68  Pulse 74  Temp(Src) 97.4 F (36.3 C)  Resp 18  Ht 6' (1.829 m)  Wt 174 lb (78.926 kg)  BMI 23.59 kg/m2  SpO2 95%  Constitutional: He appears lethargic HEENT: no pallor, no icterus, PERRLA, EOMI, MMM, no lymphadenopathy Neck: Neck supple.   Cardiovascular: Normal rate, regular rhythm and intact distal pulses. trace leg edema Respiratory: Effort normal and breath sounds normal.   GI: Soft. Bowel sounds are normal. There is no tenderness.   Musculoskeletal: wheelchair bound, weakness of lower extremities Neurological: He is alert but oriented to person and place. Right sided hemiplegia Skin: Skin is warm and dry. Unable to appreciate a blister on his foot Psychiatric: He has a normal mood and affect.    Labs reviewed:  11/10/11 wbc 4.5,hgb 11.4; hct 33.8; mcv 90.9; plt 198; glucose 101; bun 13; creat 1.06; k+ 4.6; na++ 141 liver normal albumin 3.6; chol 101; ldl 51; trig 61; tegretol  8.0   06-21-12: tegretol 12.8 07-10-12: tegretol 9.0 07-24-12: wbc 4.7; hgb 12.4; hct 35.3; mcv 91.2 .plt 189; glucose 134; bun 11; creat 0.98; k+ 4.6; na++ 138   06-07-12: wbc 5.4; hgb 12.0; hct 34.4; mcv 92.0; plt 172; glucose 154; bun 10; creat 0.92; k+ 4.4 Na++ 140; alk phos 114; ast 12; alt 20; albumin 3.6   08-07-12: glucose 149; bun 12; creat 1.04; k+ 4.6; na++ 140; chol 119; ldl 66; trig 80;   Tegretol 9.8 01/13/13 wbc 5.4, hb 13.7, hct 39.5, plt 192, na 138, k 4.3, glu 154, bun 17, cr 1.10, alp 121, lft wnl otherwise, t.chol 143, tg 131, hdl 43, ldl 74, b12 937, tegretol 10.1, vit d 42 9/14 13.7 03/05/13 wbc 7.3, hb 12.6, plt 181 05/15/13 na 142, k 4.2, glu 198, bun 15, cr 1, ca 9.5, tsh 1.97, b12 1032, folate 15.3 07/09/13 wbc 6.4, hb 12.1, hct 36.3, plt 176, na 146, k 4.3, co2 29, glu 305, bun 26, cr 1.06, alp 126, ca 9.3 07/11/13 a1c 10.5  07/04/13 xray right pelvis and femur- no fracture/ dislocation, normal ossification  Assessment/Plan  Lethargy- his new onset dm with poorly controlled sugar could be the main cause here with his baseline dementia worsening it. Will control his blood sugar first. Monitor for anion gap. No left shift in white cell count. Monitor temp and wbc curve  New onset diabetes- elevated a1c. Will start him on lantus 10 u qhs and novolog 5 u for cbg > 200 with meals for now. Monitor cbg.   Right hip pain- continue tramadol for pain for now for possible musculoskeletal pain. Fracture ruled out. No OA changes on xray.

## 2013-07-14 ENCOUNTER — Encounter (HOSPITAL_COMMUNITY): Payer: Self-pay | Admitting: Emergency Medicine

## 2013-07-14 ENCOUNTER — Emergency Department (HOSPITAL_COMMUNITY): Payer: Medicare Other

## 2013-07-14 ENCOUNTER — Inpatient Hospital Stay (HOSPITAL_COMMUNITY)
Admission: EM | Admit: 2013-07-14 | Discharge: 2013-07-21 | DRG: 640 | Disposition: A | Discharge status: Skilled Nursing Facility | Payer: Medicare Other | Attending: Internal Medicine | Admitting: Internal Medicine

## 2013-07-14 DIAGNOSIS — F0390 Unspecified dementia without behavioral disturbance: Secondary | ICD-10-CM

## 2013-07-14 DIAGNOSIS — Z66 Do not resuscitate: Secondary | ICD-10-CM | POA: Diagnosis present

## 2013-07-14 DIAGNOSIS — N289 Disorder of kidney and ureter, unspecified: Secondary | ICD-10-CM

## 2013-07-14 DIAGNOSIS — I10 Essential (primary) hypertension: Secondary | ICD-10-CM | POA: Diagnosis present

## 2013-07-14 DIAGNOSIS — M79604 Pain in right leg: Secondary | ICD-10-CM

## 2013-07-14 DIAGNOSIS — G9341 Metabolic encephalopathy: Secondary | ICD-10-CM | POA: Diagnosis present

## 2013-07-14 DIAGNOSIS — R4182 Altered mental status, unspecified: Secondary | ICD-10-CM

## 2013-07-14 DIAGNOSIS — Z515 Encounter for palliative care: Secondary | ICD-10-CM

## 2013-07-14 DIAGNOSIS — G8190 Hemiplegia, unspecified affecting unspecified side: Secondary | ICD-10-CM | POA: Diagnosis present

## 2013-07-14 DIAGNOSIS — E1165 Type 2 diabetes mellitus with hyperglycemia: Secondary | ICD-10-CM

## 2013-07-14 DIAGNOSIS — IMO0001 Reserved for inherently not codable concepts without codable children: Secondary | ICD-10-CM | POA: Diagnosis present

## 2013-07-14 DIAGNOSIS — Z794 Long term (current) use of insulin: Secondary | ICD-10-CM

## 2013-07-14 DIAGNOSIS — F3289 Other specified depressive episodes: Secondary | ICD-10-CM | POA: Diagnosis present

## 2013-07-14 DIAGNOSIS — I6930 Unspecified sequelae of cerebral infarction: Secondary | ICD-10-CM | POA: Diagnosis present

## 2013-07-14 DIAGNOSIS — R739 Hyperglycemia, unspecified: Secondary | ICD-10-CM

## 2013-07-14 DIAGNOSIS — IMO0002 Reserved for concepts with insufficient information to code with codable children: Secondary | ICD-10-CM

## 2013-07-14 DIAGNOSIS — R569 Unspecified convulsions: Secondary | ICD-10-CM

## 2013-07-14 DIAGNOSIS — G9340 Encephalopathy, unspecified: Secondary | ICD-10-CM

## 2013-07-14 DIAGNOSIS — F0280 Dementia in other diseases classified elsewhere without behavioral disturbance: Secondary | ICD-10-CM

## 2013-07-14 DIAGNOSIS — F329 Major depressive disorder, single episode, unspecified: Secondary | ICD-10-CM | POA: Diagnosis present

## 2013-07-14 DIAGNOSIS — R609 Edema, unspecified: Secondary | ICD-10-CM

## 2013-07-14 DIAGNOSIS — J690 Pneumonitis due to inhalation of food and vomit: Secondary | ICD-10-CM | POA: Diagnosis not present

## 2013-07-14 DIAGNOSIS — E870 Hyperosmolality and hypernatremia: Principal | ICD-10-CM | POA: Diagnosis present

## 2013-07-14 DIAGNOSIS — G40909 Epilepsy, unspecified, not intractable, without status epilepticus: Secondary | ICD-10-CM | POA: Diagnosis present

## 2013-07-14 DIAGNOSIS — Z8673 Personal history of transient ischemic attack (TIA), and cerebral infarction without residual deficits: Secondary | ICD-10-CM

## 2013-07-14 DIAGNOSIS — G309 Alzheimer's disease, unspecified: Secondary | ICD-10-CM

## 2013-07-14 DIAGNOSIS — F0150 Vascular dementia without behavioral disturbance: Secondary | ICD-10-CM

## 2013-07-14 DIAGNOSIS — E860 Dehydration: Secondary | ICD-10-CM | POA: Diagnosis present

## 2013-07-14 DIAGNOSIS — E785 Hyperlipidemia, unspecified: Secondary | ICD-10-CM | POA: Diagnosis present

## 2013-07-14 DIAGNOSIS — I639 Cerebral infarction, unspecified: Secondary | ICD-10-CM

## 2013-07-14 DIAGNOSIS — I69959 Hemiplegia and hemiparesis following unspecified cerebrovascular disease affecting unspecified side: Secondary | ICD-10-CM

## 2013-07-14 DIAGNOSIS — E43 Unspecified severe protein-calorie malnutrition: Secondary | ICD-10-CM | POA: Insufficient documentation

## 2013-07-14 DIAGNOSIS — M62838 Other muscle spasm: Secondary | ICD-10-CM

## 2013-07-14 DIAGNOSIS — N5089 Other specified disorders of the male genital organs: Secondary | ICD-10-CM | POA: Diagnosis present

## 2013-07-14 HISTORY — DX: Type 2 diabetes mellitus without complications: E11.9

## 2013-07-14 LAB — COMPREHENSIVE METABOLIC PANEL
ALK PHOS: 155 U/L — AB (ref 39–117)
ALT: 17 U/L (ref 0–53)
AST: 14 U/L (ref 0–37)
Albumin: 3.5 g/dL (ref 3.5–5.2)
BUN: 56 mg/dL — ABNORMAL HIGH (ref 6–23)
CALCIUM: 10 mg/dL (ref 8.4–10.5)
CO2: 27 meq/L (ref 19–32)
Chloride: 120 mEq/L — ABNORMAL HIGH (ref 96–112)
Creatinine, Ser: 1.81 mg/dL — ABNORMAL HIGH (ref 0.50–1.35)
GFR, EST AFRICAN AMERICAN: 37 mL/min — AB (ref >90)
GFR, EST NON AFRICAN AMERICAN: 32 mL/min — AB (ref >90)
GLUCOSE: 368 mg/dL — AB (ref 70–99)
POTASSIUM: 4.4 meq/L (ref 3.7–5.3)
SODIUM: 166 meq/L — AB (ref 137–147)
TOTAL PROTEIN: 8.3 g/dL (ref 6.0–8.3)
Total Bilirubin: 0.6 mg/dL (ref 0.3–1.2)

## 2013-07-14 LAB — CBC
HCT: 43.9 % (ref 39.0–52.0)
HEMATOCRIT: 42.3 % (ref 39.0–52.0)
HEMOGLOBIN: 14.4 g/dL (ref 13.0–17.0)
HEMOGLOBIN: 13.6 g/dL (ref 13.0–17.0)
MCH: 34.4 pg — AB (ref 26.0–34.0)
MCH: 34.2 pg — AB (ref 26.0–34.0)
MCHC: 32.8 g/dL (ref 30.0–36.0)
MCHC: 32.2 g/dL (ref 30.0–36.0)
MCV: 104.8 fL — ABNORMAL HIGH (ref 78.0–100.0)
MCV: 106.3 fL — AB (ref 78.0–100.0)
PLATELETS: 153 10*3/uL (ref 150–400)
Platelets: 130 10*3/uL — ABNORMAL LOW (ref 150–400)
RBC: 4.19 MIL/uL — AB (ref 4.22–5.81)
RBC: 3.98 MIL/uL — ABNORMAL LOW (ref 4.22–5.81)
RDW: 13.3 % (ref 11.5–15.5)
RDW: 13.3 % (ref 11.5–15.5)
WBC: 9.1 10*3/uL (ref 4.0–10.5)
WBC: 8.5 10*3/uL (ref 4.0–10.5)

## 2013-07-14 LAB — GLUCOSE, CAPILLARY
Glucose-Capillary: 368 mg/dL — ABNORMAL HIGH (ref 70–99)
Glucose-Capillary: 265 mg/dL — ABNORMAL HIGH (ref 70–99)
Glucose-Capillary: 230 mg/dL — ABNORMAL HIGH (ref 70–99)

## 2013-07-14 LAB — BASIC METABOLIC PANEL
BUN: 50 mg/dL — ABNORMAL HIGH (ref 6–23)
BUN: 44 mg/dL — AB (ref 6–23)
CALCIUM: 9.3 mg/dL (ref 8.4–10.5)
CALCIUM: 9.1 mg/dL (ref 8.4–10.5)
CHLORIDE: 125 meq/L — AB (ref 96–112)
CO2: 26 mEq/L (ref 19–32)
CO2: 25 meq/L (ref 19–32)
CREATININE: 1.61 mg/dL — AB (ref 0.50–1.35)
Chloride: 127 mEq/L — ABNORMAL HIGH (ref 96–112)
Creatinine, Ser: 1.35 mg/dL (ref 0.50–1.35)
GFR calc Af Amer: 43 mL/min — ABNORMAL LOW (ref >90)
GFR calc Af Amer: 53 mL/min — ABNORMAL LOW (ref >90)
GFR calc non Af Amer: 37 mL/min — ABNORMAL LOW (ref >90)
GFR, EST NON AFRICAN AMERICAN: 46 mL/min — AB (ref >90)
GLUCOSE: 297 mg/dL — AB (ref 70–99)
Glucose, Bld: 251 mg/dL — ABNORMAL HIGH (ref 70–99)
POTASSIUM: 3.9 meq/L (ref 3.7–5.3)
Potassium: 4.4 mEq/L (ref 3.7–5.3)
SODIUM: 165 meq/L — AB (ref 137–147)
Sodium: 166 mEq/L (ref 137–147)

## 2013-07-14 LAB — HEMOGLOBIN A1C
Hgb A1c MFr Bld: 10.7 % — ABNORMAL HIGH (ref <5.7)
Mean Plasma Glucose: 260 mg/dL — ABNORMAL HIGH (ref <117)

## 2013-07-14 LAB — CG4 I-STAT (LACTIC ACID): Lactic Acid, Venous: 2.18 mmol/L (ref 0.5–2.2)

## 2013-07-14 MED ORDER — CARBAMAZEPINE 200 MG PO TABS
200.0000 mg | ORAL_TABLET | ORAL | Status: DC
  Administered 2013-07-16 – 2013-07-20 (×4): 200 mg via ORAL
  Filled 2013-07-14 (×7): qty 1

## 2013-07-14 MED ORDER — SODIUM CHLORIDE 0.9 % IV SOLN
INTRAVENOUS | Status: DC
  Administered 2013-07-14: 16:00:00 via INTRAVENOUS

## 2013-07-14 MED ORDER — BACLOFEN 10 MG PO TABS
10.0000 mg | ORAL_TABLET | Freq: Two times a day (BID) | ORAL | Status: DC
  Administered 2013-07-16 – 2013-07-21 (×7): 10 mg via ORAL
  Filled 2013-07-14 (×16): qty 1

## 2013-07-14 MED ORDER — ONDANSETRON HCL 4 MG PO TABS: 4.0000 mg | ORAL_TABLET | Freq: Four times a day (QID) | ORAL | Status: DC | PRN

## 2013-07-14 MED ORDER — INSULIN GLARGINE 100 UNIT/ML ~~LOC~~ SOLN
10.0000 [IU] | Freq: Every day | SUBCUTANEOUS | Status: DC
  Administered 2013-07-14 – 2013-07-20 (×6): 10 [IU] via SUBCUTANEOUS
  Filled 2013-07-14 (×8): qty 0.1

## 2013-07-14 MED ORDER — CITALOPRAM HYDROBROMIDE 20 MG PO TABS
20.0000 mg | ORAL_TABLET | Freq: Every day | ORAL | Status: DC
  Administered 2013-07-16 – 2013-07-21 (×5): 20 mg via ORAL
  Filled 2013-07-14 (×8): qty 1

## 2013-07-14 MED ORDER — ACETAMINOPHEN 325 MG PO TABS
650.0000 mg | ORAL_TABLET | Freq: Four times a day (QID) | ORAL | Status: DC | PRN
  Administered 2013-07-21: 650 mg via ORAL
  Filled 2013-07-14 (×2): qty 2

## 2013-07-14 MED ORDER — POLYETHYL GLYCOL-PROPYL GLYCOL 0.4-0.3 % OP SOLN: 1.0000 [drp] | Freq: Four times a day (QID) | OPHTHALMIC | Status: DC

## 2013-07-14 MED ORDER — BUSPIRONE HCL 10 MG PO TABS
10.0000 mg | ORAL_TABLET | Freq: Two times a day (BID) | ORAL | Status: DC
  Administered 2013-07-16 – 2013-07-21 (×7): 10 mg via ORAL
  Filled 2013-07-14 (×16): qty 1

## 2013-07-14 MED ORDER — SODIUM CHLORIDE 0.9 % IV BOLUS (SEPSIS)
1000.0000 mL | Freq: Once | INTRAVENOUS | Status: AC
  Administered 2013-07-14: 1000 mL via INTRAVENOUS

## 2013-07-14 MED ORDER — SODIUM CHLORIDE 0.45 % IV SOLN
INTRAVENOUS | Status: DC
  Administered 2013-07-14 – 2013-07-15 (×2): via INTRAVENOUS

## 2013-07-14 MED ORDER — ACETAMINOPHEN 650 MG RE SUPP: 650.0000 mg | Freq: Four times a day (QID) | RECTAL | Status: DC | PRN

## 2013-07-14 MED ORDER — MEMANTINE HCL ER 28 MG PO CP24
1.0000 | ORAL_CAPSULE | Freq: Every day | ORAL | Status: DC
  Administered 2013-07-16 – 2013-07-21 (×5): 28 mg via ORAL
  Filled 2013-07-14 (×8): qty 28

## 2013-07-14 MED ORDER — CARBAMAZEPINE ER 300 MG PO CP12
300.0000 mg | ORAL_CAPSULE | Freq: Two times a day (BID) | ORAL | Status: DC
  Filled 2013-07-14 (×3): qty 1

## 2013-07-14 MED ORDER — POLYETHYLENE GLYCOL 3350 17 G PO PACK
17.0000 g | PACK | Freq: Every day | ORAL | Status: DC
  Administered 2013-07-16 – 2013-07-21 (×3): 17 g via ORAL
  Filled 2013-07-14 (×8): qty 1

## 2013-07-14 MED ORDER — INSULIN ASPART 100 UNIT/ML ~~LOC~~ SOLN
0.0000 [IU] | SUBCUTANEOUS | Status: DC
  Administered 2013-07-14: 5 [IU] via SUBCUTANEOUS
  Administered 2013-07-14: 21:00:00 via SUBCUTANEOUS
  Administered 2013-07-15: 1 [IU] via SUBCUTANEOUS
  Administered 2013-07-15 (×3): 2 [IU] via SUBCUTANEOUS
  Administered 2013-07-15: 1 [IU] via SUBCUTANEOUS
  Administered 2013-07-16 (×2): 5 [IU] via SUBCUTANEOUS
  Administered 2013-07-16 – 2013-07-17 (×5): 3 [IU] via SUBCUTANEOUS
  Administered 2013-07-17 (×4): 2 [IU] via SUBCUTANEOUS
  Administered 2013-07-17: 5 [IU] via SUBCUTANEOUS
  Administered 2013-07-18: 3 [IU] via SUBCUTANEOUS
  Administered 2013-07-18: 2 [IU] via SUBCUTANEOUS
  Administered 2013-07-18: 1 [IU] via SUBCUTANEOUS

## 2013-07-14 MED ORDER — SODIUM CHLORIDE 0.9 % IV SOLN: Freq: Once | INTRAVENOUS | Status: DC

## 2013-07-14 MED ORDER — SENNA-DOCUSATE SODIUM 8.6-50 MG PO TABS
1.0000 | ORAL_TABLET | Freq: Two times a day (BID) | ORAL | Status: DC
  Administered 2013-07-16 – 2013-07-20 (×4): 1 via ORAL
  Filled 2013-07-14 (×16): qty 1

## 2013-07-14 MED ORDER — DONEPEZIL HCL 10 MG PO TABS
10.0000 mg | ORAL_TABLET | Freq: Every day | ORAL | Status: DC
  Administered 2013-07-16 – 2013-07-20 (×2): 10 mg via ORAL
  Filled 2013-07-14 (×8): qty 1

## 2013-07-14 MED ORDER — INSULIN ASPART 100 UNIT/ML ~~LOC~~ SOLN: 0.0000 [IU] | Freq: Three times a day (TID) | SUBCUTANEOUS | Status: DC

## 2013-07-14 MED ORDER — ONDANSETRON HCL 4 MG/2ML IJ SOLN: 4.0000 mg | Freq: Four times a day (QID) | INTRAMUSCULAR | Status: DC | PRN

## 2013-07-14 MED ORDER — POLYVINYL ALCOHOL 1.4 % OP SOLN
1.0000 [drp] | Freq: Four times a day (QID) | OPHTHALMIC | Status: DC
  Administered 2013-07-14 – 2013-07-21 (×25): 1 [drp] via OPHTHALMIC
  Filled 2013-07-14: qty 15

## 2013-07-14 MED ORDER — CLOPIDOGREL BISULFATE 75 MG PO TABS
75.0000 mg | ORAL_TABLET | Freq: Every day | ORAL | Status: DC
  Administered 2013-07-16 – 2013-07-21 (×5): 75 mg via ORAL
  Filled 2013-07-14 (×6): qty 1

## 2013-07-14 MED ORDER — ENOXAPARIN SODIUM 40 MG/0.4ML ~~LOC~~ SOLN
40.0000 mg | SUBCUTANEOUS | Status: DC
  Administered 2013-07-14 – 2013-07-20 (×7): 40 mg via SUBCUTANEOUS
  Filled 2013-07-14 (×8): qty 0.4

## 2013-07-14 NOTE — ED Notes (Signed)
Patient transported to CT 

## 2013-07-14 NOTE — ED Notes (Signed)
CBG 368 

## 2013-07-14 NOTE — ED Provider Notes (Signed)
CSN: [id number]     Arrival date & time 07/14/13  0844 History   First MD Initiated Contact with Patient 07/14/13 [phone]     Chief Complaint  Patient presents with  . Altered Mental Status     (Consider location/radiation/quality/duration/timing/severity/associated sxs/prior Treatment) HPI Comments: Level 5 caveat.  Pt with large stroke in 2011, in nursing home since then.  Became improved gradually, alert, talkative, but unable to walk needs full care, but normally is alert.  Spouse reports was told pt has DM diagnosed just a few days ago, now on insulin.  No report of vomiting, diarrhea.  Pt is less alert, not wanting to eat, not talkative, seems to have no energy.    Patient is a 80 y.o. male presenting with altered mental status. The history is provided by medical records, the EMS personnel and the spouse. The history is limited by the condition of the patient.  Altered Mental Status Presenting symptoms: lethargy and partial responsiveness   Severity:  Severe Most recent episode:  More than 2 days ago Duration:  3 weeks Progression:  Worsening Chronicity:  New Context: nursing home resident   Associated symptoms: decreased appetite and weakness   Associated symptoms: no vomiting     Past Medical History  Diagnosis Date  . Hyperlipidemia   . Depression   . Seizures   . Hypokalemia   . Dementia   . Hemiplegia affecting dominant side, late effect of cerebrovascular disease   . Constipation   . Edema   . Diabetes mellitus without complication    History reviewed. No pertinent past surgical history. History reviewed. No pertinent family history. History  Substance Use Topics  . Smoking status: Never Smoker   . Smokeless tobacco: Not on file  . Alcohol Use: Not on file    Review of Systems  Unable to perform ROS: Mental status change  Constitutional: Positive for decreased appetite.  Gastrointestinal: Negative for vomiting.  Neurological: Positive for weakness.       Allergies  Review of patient's allergies indicates no known allergies.  Home Medications   Current Outpatient Rx  Name  Route  Sig  Dispense  Refill  . acetaminophen (TYLENOL) 325 MG tablet   Oral   Take 650 mg by mouth every 4 (four) hours as needed for mild pain or fever.         . baclofen (LIORESAL) 10 MG tablet   Oral   Take 10 mg by mouth 2 (two) times daily.          . busPIRone (BUSPAR) 10 MG tablet   Oral   Take 10 mg by mouth 2 (two) times daily.         . carbamazepine (CARBATROL) 300 MG 12 hr capsule   Oral   Take 300 mg by mouth 2 (two) times daily.         . carbamazepine (TEGRETOL) 200 MG tablet   Oral   Take 200 mg by mouth daily at 12 noon.          . citalopram (CELEXA) 20 MG tablet   Oral   Take 20 mg by mouth daily.         . clopidogrel (PLAVIX) 75 MG tablet   Oral   Take 75 mg by mouth daily.         Marland Kitchen donepezil (ARICEPT) 10 MG tablet   Oral   Take 10 mg by mouth at bedtime.          Marland Kitchen  furosemide (LASIX) 40 MG tablet   Oral   Take 40 mg by mouth daily.         . insulin aspart (NOVOLOG FLEXPEN) 100 UNIT/ML FlexPen   Subcutaneous   Inject 0-5 Units into the skin 3 (three) times daily with meals. If 0-150 give 0 units.  151 + =5 units         . Insulin Glargine (LANTUS SOLOSTAR) 100 UNIT/ML Solostar Pen   Subcutaneous   Inject 10 Units into the skin at bedtime.         . Memantine HCl ER (NAMENDA XR) 28 MG CP24   Oral   Take 1 capsule by mouth daily.         Bertram Gala. Polyethyl Glycol-Propyl Glycol (SYSTANE ULTRA) 0.4-0.3 % SOLN   Ophthalmic   Apply 1 drop to eye 4 (four) times daily. Both eyes         . polyethylene glycol (MIRALAX / GLYCOLAX) packet   Oral   Take 17 g by mouth daily.         . sennosides-docusate sodium (SENOKOT-S) 8.6-50 MG tablet   Oral   Take 1 tablet by mouth 2 (two) times daily.         . traMADol (ULTRAM) 50 MG tablet   Oral   Take 50 mg by mouth 2 (two) times daily. For 5  days due to leg pain.         . Multiple Vitamin (MULTIVITAMIN) tablet   Oral   Take 1 tablet by mouth daily.          BP 136/73  Pulse 114  Temp(Src) 99.4 F (37.4 C) (Rectal)  Resp 24  Ht 5\' 8"  (1.727 m)  Wt 170 lb (77.111 kg)  BMI 25.85 kg/m2 Physical Exam  Nursing note and vitals reviewed. Constitutional: He appears well-developed. He appears listless.  Non-toxic appearance. He does not have a sickly appearance. He appears ill. No distress.  HENT:  Head: Normocephalic and atraumatic.  Eyes: Conjunctivae are normal. No scleral icterus.  Neck: Normal range of motion. Neck supple.  Cardiovascular: Regular rhythm and intact distal pulses.   No extrasystoles are present. Tachycardia present.   Pulmonary/Chest: Tachypnea noted. No respiratory distress. He has no wheezes. He has no rales.  Abdominal: Soft. There is no tenderness. There is no rebound and no guarding.  Neurological: He appears listless.  Skin: Skin is warm and dry. No rash noted. He is not diaphoretic.    ED Course  Procedures (including critical care time)   Labs Review Labs Reviewed  CBC - Abnormal; Notable for the following:    RBC 4.19 (*)    MCV 104.8 (*)    MCH 34.4 (*)    All other components within normal limits  COMPREHENSIVE METABOLIC PANEL - Abnormal; Notable for the following:    Sodium 166 (*)    Chloride 120 (*)    Glucose, Bld 368 (*)    BUN 56 (*)    Creatinine, Ser 1.81 (*)    Alkaline Phosphatase 155 (*)    GFR calc non Af Amer 32 (*)    GFR calc Af Amer 37 (*)    All other components within normal limits  GLUCOSE, CAPILLARY - Abnormal; Notable for the following:    Glucose-Capillary 368 (*)    All other components within normal limits  URINALYSIS, ROUTINE W REFLEX MICROSCOPIC  CG4 I-STAT (LACTIC ACID)  I-STAT CHEM 8, ED   Imaging Review Ct Head Wo Contrast  07/14/2013   CLINICAL DATA:  Altered mental status.  EXAM: CT HEAD WITHOUT CONTRAST  TECHNIQUE: Contiguous axial  images were obtained from the base of the skull through the vertex without intravenous contrast.  COMPARISON:  CT scan dated 03/12/2010 and MRI of the brain dated 06/02/2010  FINDINGS: No mass lesion. No midline shift. No acute hemorrhage or hematoma. No extra-axial fluid collections. No evidence of acute infarction. There are numerous old cerebellar and cerebral infarcts with secondary encephalomalacia. There is diffuse cerebral cortical atrophy with slight increased dilatation of the ventricles since the prior exam.  No osseous abnormality.  IMPRESSION: 1. No acute intracranial abnormality. 2. Numerous old infarcts. 3. Progressive diffuse atrophy.   Electronically Signed   By: Geanie Cooley M.D.   On: 07/14/2013 10:00   Dg Chest Portable 1 View  07/14/2013   CLINICAL DATA:  Acute mental status changes. Prior stroke with hemiplegia.  EXAM: PORTABLE CHEST - 1 VIEW  COMPARISON:  CT CHEST W/CM dated 03/13/2010; DG CHEST 2 VIEW dated 03/13/2010; DG CHEST 2 VIEW dated 12/14/2006; DG CHEST 1V PORT dated 10/10/2006  FINDINGS: Suboptimal inspiration accounts for crowded bronchovascular markings diffusely and atelectasis in the bases, and accentuates the cardiac silhouette. Taking this into account, cardiac silhouette normal in size, unchanged. Thoracic aorta mildly tortuous and atherosclerotic, unchanged. Hilar and mediastinal contours otherwise unremarkable. Lungs otherwise clear. No localized airspace consolidation. No pleural effusions. No pneumothorax. Normal pulmonary vascularity.  IMPRESSION: Suboptimal inspiration accounts for bibasilar atelectasis. No acute cardiopulmonary disease otherwise.   Electronically Signed   By: Hulan Saas M.D.   On: 07/14/2013 09:36    EKG Interpretation    Date/Time:  Monday July 14 2013 08:55:59 EST Ventricular Rate:  114 PR Interval:    QRS Duration: 71 QT Interval:  411 QTC Calculation: 566 R Axis:   -55 Text Interpretation:  Sinus tachycardia Left anterior  fascicular block Consider left ventricular hypertrophy Nonspecific T abnormalities, lateral leads Prolonged QT interval Abnormal ECG Confirmed by [facility]  MD, MICHEAL ([id number]) on 07/14/2013 9:18:53 AM           11:20 AM Na is back at 166, renal insuff.  Pt is dehydrated.  UA is pending, but due to scrotal edema, has not been able to get a sample.  This is discussed with Dr. York Spaniel who accepts as admission to tele floor.  IVF's will be continued.  Head CT and CXR are unremarkable for acute process.      MDM   Final diagnoses:  Dehydration with hypernatremia  Renal insufficiency  Hyperglycemia  Altered mental status    Pt is clearly weak, whispers 1 word answers yes and no to a few questions.  Unable to comply with a neuro exam.  Pt is somewhat alert, localizes to pain, will not open eyes for a long period, briefly looked at wife and nodded head when she spoke to him.    Suspect sepsis, new stroke, DKA, HOHNK, dehydration as possibilities.       Gavin Pound. Harley Fitzwater, MD 07/14/13 1121

## 2013-07-14 NOTE — Progress Notes (Signed)
Per Dr. York SpanielBuriev, not ruling out stroke, patients altered mental status is related to severe dehydration.  Danny Gilbert, Danny Gilbert

## 2013-07-14 NOTE — H&P (Signed)
[facility] History and Physical  Danny Festeraul A Latu ZOX:[id number] DOB: 08/30/1923 DOA: 07/14/2013  Referring physician:  PCP: Bufford SpikesEED, TIFFANY, DO  Specialists:   Chief Complaint: change in mental status    HPI: Danny Gilbert is a 80 y.o. male with PMH of HTN, HPL, h/o CVA with R sided paralysis, seizures, recent Dx with DM, dementia presented with progressive encephalopathy, lethargy, poor oral intake, dehydration and found to have hyper Na; per his wife, patient has dementia, CVA and NH resident;  He was refusing to take Po intake for few weeks, progressively getting worse, weak, lethargic; no nausea, vomiting, no abdominal pain, no chest pain, no SOB, no fever; no new focal weaknesses   Review of Systems: The patient denies anorexia, fever, weight loss,, vision loss, decreased hearing, hoarseness, chest pain, syncope, dyspnea on exertion, peripheral edema, balance deficits, hemoptysis, abdominal pain, melena, hematochezia, severe indigestion/heartburn, hematuria, incontinence, genital sores, muscle weakness, suspicious skin lesions, transient blindness, difficulty walking, depression, unusual weight change, abnormal bleeding, enlarged lymph nodes, angioedema, and breast masses.    Past Medical History  Diagnosis Date  . Hyperlipidemia   . Depression   . Seizures   . Hypokalemia   . Dementia   . Hemiplegia affecting dominant side, late effect of cerebrovascular disease   . Constipation   . Edema   . Diabetes mellitus without complication    History reviewed. No pertinent past surgical history. Social History:  reports that he has never smoked. He does not have any smokeless tobacco history on file. His alcohol and drug histories are not on file. NH; where does patient live--home, ALF, SNF? and with whom if at home? No;  Can patient participate in ADLs?  No Known Allergies  History reviewed. No pertinent family history. no h/o CAD (be sure to complete)  Prior to Admission  medications   Medication Sig Start Date End Date Taking? Authorizing Provider  acetaminophen (TYLENOL) 325 MG tablet Take 650 mg by mouth every 4 (four) hours as needed for mild pain or fever.   Yes Historical Provider, MD  baclofen (LIORESAL) 10 MG tablet Take 10 mg by mouth 2 (two) times daily.    Yes Historical Provider, MD  busPIRone (BUSPAR) 10 MG tablet Take 10 mg by mouth 2 (two) times daily.   Yes Historical Provider, MD  carbamazepine (CARBATROL) 300 MG 12 hr capsule Take 300 mg by mouth 2 (two) times daily.   Yes Historical Provider, MD  carbamazepine (TEGRETOL) 200 MG tablet Take 200 mg by mouth daily at 12 noon.    Yes Historical Provider, MD  citalopram (CELEXA) 20 MG tablet Take 20 mg by mouth daily.   Yes Historical Provider, MD  clopidogrel (PLAVIX) 75 MG tablet Take 75 mg by mouth daily.   Yes Historical Provider, MD  donepezil (ARICEPT) 10 MG tablet Take 10 mg by mouth at bedtime.    Yes Historical Provider, MD  furosemide (LASIX) 40 MG tablet Take 40 mg by mouth daily.   Yes Historical Provider, MD  insulin aspart (NOVOLOG FLEXPEN) 100 UNIT/ML FlexPen Inject 0-5 Units into the skin 3 (three) times daily with meals. If 0-150 give 0 units.  151 + =5 units   Yes Historical Provider, MD  Insulin Glargine (LANTUS SOLOSTAR) 100 UNIT/ML Solostar Pen Inject 10 Units into the skin at bedtime.   Yes Historical Provider, MD  Memantine HCl ER (NAMENDA XR) 28 MG CP24 Take 1 capsule by mouth daily.   Yes Historical Provider, MD  Polyethyl Glycol-Propyl  Glycol (SYSTANE ULTRA) 0.4-0.3 % SOLN Apply 1 drop to eye 4 (four) times daily. Both eyes   Yes Historical Provider, MD  polyethylene glycol (MIRALAX / GLYCOLAX) packet Take 17 g by mouth daily.   Yes Historical Provider, MD  sennosides-docusate sodium (SENOKOT-S) 8.6-50 MG tablet Take 1 tablet by mouth 2 (two) times daily.   Yes Historical Provider, MD  traMADol (ULTRAM) 50 MG tablet Take 50 mg by mouth 2 (two) times daily. For 5 days due to leg  pain.   Yes Historical Provider, MD  Multiple Vitamin (MULTIVITAMIN) tablet Take 1 tablet by mouth daily.    Historical Provider, MD   Physical Exam: Filed Vitals:   07/14/13 1125  BP: 145/72  Pulse:   Temp: 98.1 F (36.7 C)  Resp: 18     General:  lethargic   Eyes: eom-i  ENT: no oral ulcers  Neck: supple   Cardiovascular: s1,s2 rrr  Respiratory: CTA BL  Abdomen: soft, nt, nd   Skin: no rash  Musculoskeletal: no LE edema  Psychiatric: no hallucinations   Neurologic: R sided paralysis, lethargic; able to move L side;   Labs on Admission:  Basic Metabolic Panel:  Recent Labs Lab 07/14/13 0904  NA 166*  K 4.4  CL 120*  CO2 27  GLUCOSE 368*  BUN 56*  CREATININE 1.81*  CALCIUM 10.0   Liver Function Tests:  Recent Labs Lab 07/14/13 0904  AST 14  ALT 17  ALKPHOS 155*  BILITOT 0.6  PROT 8.3  ALBUMIN 3.5   No results found for this basename: LIPASE, AMYLASE,  in the last 168 hours No results found for this basename: AMMONIA,  in the last 168 hours CBC:  Recent Labs Lab 07/14/13 0904  WBC 9.1  HGB 14.4  HCT 43.9  MCV 104.8*  PLT 153   Cardiac Enzymes: No results found for this basename: CKTOTAL, CKMB, CKMBINDEX, TROPONINI,  in the last 168 hours  BNP (last 3 results) No results found for this basename: PROBNP,  in the last 8760 hours CBG:  Recent Labs Lab 07/14/13 0936  GLUCAP 368*    Radiological Exams on Admission: Ct Head Wo Contrast  07/14/2013   CLINICAL DATA:  Altered mental status.  EXAM: CT HEAD WITHOUT CONTRAST  TECHNIQUE: Contiguous axial images were obtained from the base of the skull through the vertex without intravenous contrast.  COMPARISON:  CT scan dated 03/12/2010 and MRI of the brain dated 06/02/2010  FINDINGS: No mass lesion. No midline shift. No acute hemorrhage or hematoma. No extra-axial fluid collections. No evidence of acute infarction. There are numerous old cerebellar and cerebral infarcts with secondary  encephalomalacia. There is diffuse cerebral cortical atrophy with slight increased dilatation of the ventricles since the prior exam.  No osseous abnormality.  IMPRESSION: 1. No acute intracranial abnormality. 2. Numerous old infarcts. 3. Progressive diffuse atrophy.   Electronically Signed   By: Geanie Cooley M.D.   On: 07/14/2013 10:00   Dg Chest Portable 1 View  07/14/2013   CLINICAL DATA:  Acute mental status changes. Prior stroke with hemiplegia.  EXAM: PORTABLE CHEST - 1 VIEW  COMPARISON:  CT CHEST W/CM dated 03/13/2010; DG CHEST 2 VIEW dated 03/13/2010; DG CHEST 2 VIEW dated 12/14/2006; DG CHEST 1V PORT dated 10/10/2006  FINDINGS: Suboptimal inspiration accounts for crowded bronchovascular markings diffusely and atelectasis in the bases, and accentuates the cardiac silhouette. Taking this into account, cardiac silhouette normal in size, unchanged. Thoracic aorta mildly tortuous and atherosclerotic, unchanged. Hilar  and mediastinal contours otherwise unremarkable. Lungs otherwise clear. No localized airspace consolidation. No pleural effusions. No pneumothorax. Normal pulmonary vascularity.  IMPRESSION: Suboptimal inspiration accounts for bibasilar atelectasis. No acute cardiopulmonary disease otherwise.   Electronically Signed   By: Hulan Saas M.D.   On: 07/14/2013 09:36    EKG: Independently reviewed. Sinus tachy cardia   Assessment/Plan Principal Problem:   Dehydration with hypernatremia Active Problems:   Hemiplegia affecting dominant side, late effect of cerebrovascular disease   Seizures   CVA (cerebral vascular accident)   Dementia  80 y.o. male with PMH of HTN, HPL, h/o CVA with R sided paralysis, seizures, recent Dx with DM, dementia presented with progressive encephalopathy, lethargy, poor oral intake, dehydration and found to have hyper Na, progressive mental status change   1. Hype Na likely due to severe dehydration (poor oral intake +lasix) -s/p IV NS bolus in ED; cont  IVF/NS; f/u BMP Q8 closely; avoid overcorrection/adjust based on on BMP; hold lasix   2. Acute encephalopathy likely metabolic on top of underlying dementia ; CT head: no acute findings; old CVA, brain atrophy;  -pend UA r/o UTI; cont as above; supportive care   3. DM recent Dx; no A1C;  -cont insulin regimen; check A1C;   4. Dementia, h/o CVA R sided paralysis, seizures; cont home regimen; wife plan to d/w family about code status   5. HTN hold diuretics; prn hydralazine      None;  if consultant consulted, please document name and whether formally or informally consulted  Code Status: full (must indicate code status--if unknown or must be presumed, indicate so) Family Communication: d/w his wife (indicate person spoken with, if applicable, with phone number if by telephone) Disposition Plan: pend clinical improvement, likely SNF (indicate anticipated LOS)  Time spent: >35 minutes   Esperanza Sheets [facility] Pager [phone]  If 7PM-7AM, please contact night-coverage [email or web address] Password [facility] 07/14/2013, 11:49 AM

## 2013-07-14 NOTE — ED Notes (Signed)
Family given ice water. 

## 2013-07-14 NOTE — Progress Notes (Signed)
CRITICAL VALUE ALERT  Critical value received:  1600  Date of notification:  07/14/2013  Time of notification:  1600  Critical value read back:yes  Nurse who received alert:  Rhae LernerFARRAH Christropher Gintz RN  MD notified (1st page):  [email or web address]  Time of first page:  1650  MD notified (2nd page):  Time of second page:  Responding MD:      Time MD responded:     DR. York SpanielBURIEV also notified patient continues to be lethargic and not safe to take pills or food at this time.  Will continue to monitor.  Colman Caterarpley, Jahaan Vanwagner Danielle

## 2013-07-14 NOTE — Progress Notes (Signed)
CSW consult to pt regarding visit to ED. [facility] CSW spoke with pt.'s wife to determine plan of care and [facility]. [facility] CSW shared with pt.'s wife that if transferring to another facility is the plan the pt would need a 3 day-3 night admission in a hospital in order for Medicare to pay for services. If no qualifying stay pt would be private pay costing several thousands of dollars each month. CSW shared with pt.'s wife that it would be easier [facility] and work with [facility] to find placement. Pt.'s wife voiced understanding but still not certain of disposition.    [address]Doris Jamauri Gilbert, Connecticut[place]CSWA [phone]

## 2013-07-14 NOTE — ED Notes (Addendum)
[facility] patient's wife stated last known well 6 days ago and Nursing Home reported to EMS last known well 2 days ago.  Patient lethargic confused speaks one word answers softly. CBG 419

## 2013-07-14 NOTE — Progress Notes (Signed)
Utilization review completed.  

## 2013-07-14 NOTE — Plan of Care (Signed)
Problem: Phase I Progression Outcomes Goal: Voiding-avoid urinary catheter unless indicated Outcome: Completed/Met Date Met:  07/14/13 Incontinent

## 2013-07-15 DIAGNOSIS — I6350 Cerebral infarction due to unspecified occlusion or stenosis of unspecified cerebral artery: Secondary | ICD-10-CM

## 2013-07-15 LAB — BASIC METABOLIC PANEL
BUN: 36 mg/dL — ABNORMAL HIGH (ref 6–23)
BUN: 32 mg/dL — AB (ref 6–23)
BUN: 27 mg/dL — AB (ref 6–23)
CHLORIDE: 126 meq/L — AB (ref 96–112)
CHLORIDE: 122 meq/L — AB (ref 96–112)
CO2: 23 mEq/L (ref 19–32)
CO2: 25 meq/L (ref 19–32)
CO2: 25 meq/L (ref 19–32)
CREATININE: 1.04 mg/dL (ref 0.50–1.35)
Calcium: 8.9 mg/dL (ref 8.4–10.5)
Calcium: 9 mg/dL (ref 8.4–10.5)
Calcium: 8.6 mg/dL (ref 8.4–10.5)
Chloride: 123 mEq/L — ABNORMAL HIGH (ref 96–112)
Creatinine, Ser: 1.16 mg/dL (ref 0.50–1.35)
Creatinine, Ser: 1.11 mg/dL (ref 0.50–1.35)
GFR calc Af Amer: 67 mL/min — ABNORMAL LOW (ref >90)
GFR calc Af Amer: 72 mL/min — ABNORMAL LOW (ref >90)
GFR calc non Af Amer: 55 mL/min — ABNORMAL LOW (ref >90)
GFR calc non Af Amer: 58 mL/min — ABNORMAL LOW (ref >90)
GFR calc non Af Amer: 62 mL/min — ABNORMAL LOW (ref >90)
GFR, EST AFRICAN AMERICAN: 63 mL/min — AB (ref >90)
GLUCOSE: 124 mg/dL — AB (ref 70–99)
GLUCOSE: 259 mg/dL — AB (ref 70–99)
Glucose, Bld: 188 mg/dL — ABNORMAL HIGH (ref 70–99)
POTASSIUM: 4.3 meq/L (ref 3.7–5.3)
POTASSIUM: 3.8 meq/L (ref 3.7–5.3)
POTASSIUM: 3.8 meq/L (ref 3.7–5.3)
SODIUM: 162 meq/L — AB (ref 137–147)
Sodium: 163 mEq/L (ref 137–147)
Sodium: 160 mEq/L — ABNORMAL HIGH (ref 137–147)

## 2013-07-15 LAB — GLUCOSE, CAPILLARY
GLUCOSE-CAPILLARY: 105 mg/dL — AB (ref 70–99)
GLUCOSE-CAPILLARY: 124 mg/dL — AB (ref 70–99)
GLUCOSE-CAPILLARY: 189 mg/dL — AB (ref 70–99)
Glucose-Capillary: 140 mg/dL — ABNORMAL HIGH (ref 70–99)
Glucose-Capillary: 190 mg/dL — ABNORMAL HIGH (ref 70–99)
Glucose-Capillary: 199 mg/dL — ABNORMAL HIGH (ref 70–99)

## 2013-07-15 MED ORDER — CHLORHEXIDINE GLUCONATE 0.12 % MT SOLN
15.0000 mL | Freq: Two times a day (BID) | OROMUCOSAL | Status: DC
  Administered 2013-07-16 – 2013-07-21 (×5): 15 mL via OROMUCOSAL
  Filled 2013-07-15 (×14): qty 15

## 2013-07-15 MED ORDER — BIOTENE DRY MOUTH MT LIQD
15.0000 mL | Freq: Two times a day (BID) | OROMUCOSAL | Status: DC
  Administered 2013-07-16 – 2013-07-21 (×5): 15 mL via OROMUCOSAL

## 2013-07-15 MED ORDER — CARBAMAZEPINE ER 200 MG PO TB12
300.0000 mg | ORAL_TABLET | Freq: Two times a day (BID) | ORAL | Status: DC
  Administered 2013-07-16 – 2013-07-21 (×7): 300 mg via ORAL
  Filled 2013-07-15 (×14): qty 1

## 2013-07-15 MED ORDER — DEXTROSE 5 % IV SOLN
INTRAVENOUS | Status: DC
  Administered 2013-07-15 – 2013-07-16 (×4): via INTRAVENOUS

## 2013-07-15 NOTE — Progress Notes (Signed)
CRITICAL VALUE ALERT  Critical value received:  Na 165  Date of notification:  07/14/2013  Time of notification:  2314  Critical value read back: yes  Nurse who received alert:  Modena NunneryAshley Eisma RN  MD notified (1st page):  Dr. Craige CottaKirby  Time of first page:  2314  No new orders at this time.

## 2013-07-15 NOTE — Plan of Care (Signed)
Problem: Problem: Diet/Nutrition Progression Goal: ADEQUATE NUTRITION Outcome: Progressing PATIENT CONTINUES TO BE NPO, AWAITING MBS IN AM

## 2013-07-15 NOTE — Evaluation (Signed)
Clinical/Bedside Swallow Evaluation Patient Details  Name: JANOS SHAMPINE MRN: [id number] Date of Birth: 11-24-1923  Today's Date: 07/15/2013 Time: 4098-1191 SLP Time Calculation (min): 16 min  Past Medical History:  Past Medical History  Diagnosis Date  . Hyperlipidemia   . Depression   . Seizures   . Hypokalemia   . Dementia   . Hemiplegia affecting dominant side, late effect of cerebrovascular disease   . Constipation   . Edema   . Diabetes mellitus without complication    Past Surgical History: History reviewed. No pertinent past surgical history. HPI:  CAYDENCE ENCK is a 80 y.o. male with PMH of HTN, HPL, h/o CVA with R sided paralysis, seizures, recent Dx with DM, dementia presented with progressive encephalopathy, lethargy, poor oral intake, dehydration and found to have hyper Na; per his wife, patient has dementia, CVA and NH resident;  He was refusing to take Po intake for few weeks, progressively getting worse, weak, lethargic; no nausea, vomiting, no abdominal pain, no chest pain, no SOB, no fever; no new focal weaknesses. CXR clear.    Assessment / Plan / Recommendation Clinical Impression  Pt demonstrates impaired swallow. Decreased oral manipulation of bolus, delayed transit. Pt grimaces with swallow and endorses painful swallow. There is immediate cough with thin liquids indicative of decreased airway protection. Pt has been refusing PO recently though he typically has a good appetite per family. Recommend MBS to objectively evaluate swallow function. Will complete tomorrow, NPO until then.      Aspiration Risk  Moderate    Diet Recommendation NPO   Medication Administration: Via alternative means    Other  Recommendations Recommended Consults: MBS Oral Care Recommendations: Oral care Q4 per protocol Other Recommendations: Order thickener from pharmacy   Follow Up Recommendations  Skilled Nursing facility    Frequency and Duration        Pertinent  Vitals/Pain NA    SLP Swallow Goals     Swallow Study Prior Functional Status       General HPI: ROCH QUACH is a 80 y.o. male with PMH of HTN, HPL, h/o CVA with R sided paralysis, seizures, recent Dx with DM, dementia presented with progressive encephalopathy, lethargy, poor oral intake, dehydration and found to have hyper Na; per his wife, patient has dementia, CVA and NH resident;  He was refusing to take Po intake for few weeks, progressively getting worse, weak, lethargic; no nausea, vomiting, no abdominal pain, no chest pain, no SOB, no fever; no new focal weaknesses. CXR clear.  Type of Study: Bedside swallow evaluation Previous Swallow Assessment: noen in chart Diet Prior to this Study: NPO Temperature Spikes Noted: No Respiratory Status: Nasal cannula History of Recent Intubation: No Behavior/Cognition: Alert;Confused Oral Cavity - Dentition: Dentures, top;Dentures, bottom Self-Feeding Abilities: Needs assist Patient Positioning: Upright in bed Baseline Vocal Quality: Low vocal intensity Volitional Cough: Strong Volitional Swallow: Able to elicit    Oral/Motor/Sensory Function Overall Oral Motor/Sensory Function: Impaired at baseline Labial ROM: Reduced right Labial Symmetry: Abnormal symmetry right Labial Strength: Reduced Labial Sensation: Reduced Lingual ROM: Within Functional Limits Lingual Symmetry: Within Functional Limits Lingual Strength: Within Functional Limits Lingual Sensation: Within Functional Limits Facial ROM: Within Functional Limits Facial Symmetry: Within Functional Limits Facial Strength: Within Functional Limits Facial Sensation: Within Functional Limits   Ice Chips Ice chips: Impaired Presentation: Spoon Oral Phase Impairments: Reduced labial seal;Reduced lingual movement/coordination;Impaired anterior to posterior transit Oral Phase Functional Implications: Oral residue;Prolonged oral transit Pharyngeal Phase Impairments: Suspected delayed  Swallow;Decreased hyoid-laryngeal movement   Thin Liquid Thin Liquid: Impaired Presentation: Cup;Spoon Oral Phase Impairments: Reduced labial seal;Reduced lingual movement/coordination;Impaired anterior to posterior transit Oral Phase Functional Implications: Prolonged oral transit Pharyngeal  Phase Impairments: Suspected delayed Swallow;Decreased hyoid-laryngeal movement;Multiple swallows;Wet Vocal Quality;Throat Clearing - Immediate;Cough - Immediate    Nectar Thick Nectar Thick Liquid: Not tested   Honey Thick Honey Thick Liquid: Not tested   Puree Puree: Impaired Presentation: Spoon Oral Phase Impairments: Impaired anterior to posterior transit Oral Phase Functional Implications: Prolonged oral transit Pharyngeal Phase Impairments: Suspected delayed Swallow;Decreased hyoid-laryngeal movement;Throat Clearing - Immediate   Solid   GO    Solid: Not tested      Harlon DittyBonnie Seon Gaertner, MA CCC-SLP [phone]  Kyrra Prada, Riley NearingBonnie Caroline 07/15/2013,2:58 PM

## 2013-07-15 NOTE — Progress Notes (Signed)
Patient ID: Danny Gilbert  male  ZOX:[id number]    DOB: [birth year]    DOA: 07/14/2013  PCP: Bufford Spikes, DO  Assessment/Plan: Principal Problem:   Dehydration with severe hypernatremia: Due to poor oral intake, Lasix and newly diagnosed diabetes mellitus  - Continue D5 water, has received more than 3 L of normal saline, continue BMET every 8 hours - Currently on n.p.o. status awaiting for a swallow study  Active Problems: Newly diagnosed diabetes mellitus - Hemoglobin A1c 10.7, likely caused significant glucosuria, polyuria  - Diabetic coordinator consult - Patient will need to continue insulin, no DKA at this time - Currently on sliding scale insulin q4hrs    Hemiplegia affecting dominant side, late effect of cerebrovascular disease Due to CVA -  continue Plavix - PTOT consult - Swallow consult, speech therapy    Seizures - Continue Tegretol    Dementia - Continue Celexa, Tegretol, BuSpar, Aricept    Acute encephalopathy - Significantly improving, likely worsened due to acute hypernatremia   DVT Prophylaxis:  Code Status:  Family Communication:Discussed in detail with patient's wife, son and another family member at the bedside  Disposition: SNF when ready    Subjective: Patient alert and awake, has dysarthria, multiple family members around at bedside  Objective: Weight change:   Intake/Output Summary (Last 24 hours) at 07/15/13 1458 Last data filed at 07/15/13 0432  Gross per 24 hour  Intake 710.42 ml  Output      6 ml  Net 704.42 ml   Blood pressure 145/70, pulse 94, temperature 97.7 F (36.5 C), temperature source Oral, resp. rate 20, height 5\' 8"  (1.727 m), weight 73.4 kg (161 lb 13.1 oz), SpO2 94.00%.  Physical Exam: General: Alert and awake, oriented , not in any acute distress., Dry mucosal membranes  CVS: S1-S2 clear, no murmur rubs or gallops Chest: clear to auscultation bilaterally, no wheezing, rales or rhonchi Abdomen: soft nontender,  nondistended, normal bowel sounds  Extremities: no cyanosis, clubbing or edema noted bilaterally Neuro:  right-sided hemiplegia and dysarthria  Lab Results: Basic Metabolic Panel:  Recent Labs Lab 07/14/13 2152 07/15/13 0905  NA 165* 163*  K 3.9 4.3  CL 127* 126*  CO2 25 23  GLUCOSE 251* 124*  BUN 44* 36*  CREATININE 1.35 1.16  CALCIUM 9.1 8.9   Liver Function Tests:  Recent Labs Lab 07/14/13 0904  AST 14  ALT 17  ALKPHOS 155*  BILITOT 0.6  PROT 8.3  ALBUMIN 3.5   No results found for this basename: LIPASE, AMYLASE,  in the last 168 hours No results found for this basename: AMMONIA,  in the last 168 hours CBC:  Recent Labs Lab 07/14/13 0904 07/14/13 1502  WBC 9.1 8.5  HGB 14.4 13.6  HCT 43.9 42.3  MCV 104.8* 106.3*  PLT 153 130*   Cardiac Enzymes: No results found for this basename: CKTOTAL, CKMB, CKMBINDEX, TROPONINI,  in the last 168 hours BNP: No components found with this basename: POCBNP,  CBG:  Recent Labs Lab 07/14/13 2057 07/15/13 0022 07/15/13 0423 07/15/13 0747 07/15/13 1058  GLUCAP 230* 189* 140* 124* 105*     Micro Results: No results found for this or any previous visit (from the past 240 hour(s)).  Studies/Results: Ct Head Wo Contrast  07/14/2013   CLINICAL DATA:  Altered mental status.  EXAM: CT HEAD WITHOUT CONTRAST  TECHNIQUE: Contiguous axial images were obtained from the base of the skull through the vertex without intravenous contrast.  COMPARISON:  CT scan dated  03/12/2010 and MRI of the brain dated 06/02/2010  FINDINGS: No mass lesion. No midline shift. No acute hemorrhage or hematoma. No extra-axial fluid collections. No evidence of acute infarction. There are numerous old cerebellar and cerebral infarcts with secondary encephalomalacia. There is diffuse cerebral cortical atrophy with slight increased dilatation of the ventricles since the prior exam.  No osseous abnormality.  IMPRESSION: 1. No acute intracranial abnormality.  2. Numerous old infarcts. 3. Progressive diffuse atrophy.   Electronically Signed   By: Geanie CooleyJim  Maxwell M.D.   On: 07/14/2013 10:00   Dg Chest Portable 1 View  07/14/2013   CLINICAL DATA:  Acute mental status changes. Prior stroke with hemiplegia.  EXAM: PORTABLE CHEST - 1 VIEW  COMPARISON:  CT CHEST W/CM dated 03/13/2010; DG CHEST 2 VIEW dated 03/13/2010; DG CHEST 2 VIEW dated 12/14/2006; DG CHEST 1V PORT dated 10/10/2006  FINDINGS: Suboptimal inspiration accounts for crowded bronchovascular markings diffusely and atelectasis in the bases, and accentuates the cardiac silhouette. Taking this into account, cardiac silhouette normal in size, unchanged. Thoracic aorta mildly tortuous and atherosclerotic, unchanged. Hilar and mediastinal contours otherwise unremarkable. Lungs otherwise clear. No localized airspace consolidation. No pleural effusions. No pneumothorax. Normal pulmonary vascularity.  IMPRESSION: Suboptimal inspiration accounts for bibasilar atelectasis. No acute cardiopulmonary disease otherwise.   Electronically Signed   By: Hulan Saashomas  Lawrence M.D.   On: 07/14/2013 09:36    Medications: Scheduled Meds: . baclofen  10 mg Oral BID  . busPIRone  10 mg Oral BID  . carbamazepine  300 mg Oral BID  . carbamazepine  200 mg Oral Q24H  . citalopram  20 mg Oral Daily  . clopidogrel  75 mg Oral Daily  . donepezil  10 mg Oral QHS  . enoxaparin (LOVENOX) injection  40 mg Subcutaneous Q24H  . insulin aspart  0-9 Units Subcutaneous 6 times per day  . insulin glargine  10 Units Subcutaneous QHS  . Memantine HCl ER  1 capsule Oral Daily  . polyethylene glycol  17 g Oral Daily  . polyvinyl alcohol  1 drop Both Eyes QID  . sennosides-docusate sodium  1 tablet Oral BID      LOS: 1 day   RAI,RIPUDEEP M.D. [facility] 07/15/2013, 2:58 PM Pager: [phone]  If 7PM-7AM, please contact night-coverage [email or web address] Password [email or web address]

## 2013-07-15 NOTE — Progress Notes (Signed)
Inpatient Diabetes Program Recommendations  AACE/ADA: New Consensus Statement on Inpatient Glycemic Control (2013)  Target Ranges:  Prepandial:   less than 140 mg/dL      Peak postprandial:   less than 180 mg/dL (1-2 hours)      Critically ill patients:  140 - 180 mg/dL     Results for Danny Gilbert, Danny Gilbert (MRN [id number]) as of 07/15/2013 15:17  Ref. Range 07/15/2013 00:22 07/15/2013 04:23 07/15/2013 07:47 07/15/2013 10:58  Glucose-Capillary Latest Range: 70-99 mg/dL 409189 (H) 811140 (H) 914124 (H) 105 (H)    Results for Danny Gilbert, Danny Gilbert (MRN [id number]) as of 07/15/2013 15:17  Ref. Range 07/14/2013 15:02  Hemoglobin A1C Latest Range: <5.7 % 10.7 (H)    Diabetes history: Diagnosed with DM on 07/11/13 at SNF [facility]) Diagnosed by Dr. Oneal GroutMahima Pandey Outpatient Diabetes medications: Lantus 10 units QHS + Novolog 5 units tid with meals for CBG >200 mg/dl Current orders: Lantus 10 units QHS + Novolog Sensitive SSI Q4 hours   **Noted patient admitted with AMS/Dehydration/Increased Na+ level.  Per chart review, patient was diagnosed with DM on 02/13 by Dr. Glade LloydPandey at the SNF.    **CBGs well controlled so far here in hospital on current regimen.    **Will follow and assist daily.  Ambrose FinlandJeannine Johnston Maleik Vanderzee RN, MSN, CDE Diabetes Coordinator Inpatient Diabetes Program Team Pager: [phone] (8a-10p)

## 2013-07-16 ENCOUNTER — Inpatient Hospital Stay (HOSPITAL_COMMUNITY): Payer: Medicare Other

## 2013-07-16 DIAGNOSIS — R609 Edema, unspecified: Secondary | ICD-10-CM

## 2013-07-16 LAB — BASIC METABOLIC PANEL
BUN: 16 mg/dL (ref 6–23)
CO2: 21 mEq/L (ref 19–32)
CREATININE: 0.89 mg/dL (ref 0.50–1.35)
Calcium: 8.8 mg/dL (ref 8.4–10.5)
Chloride: 112 mEq/L (ref 96–112)
GFR calc Af Amer: 87 mL/min — ABNORMAL LOW (ref >90)
GFR, EST NON AFRICAN AMERICAN: 75 mL/min — AB (ref >90)
GLUCOSE: 280 mg/dL — AB (ref 70–99)
POTASSIUM: 3.5 meq/L — AB (ref 3.7–5.3)
Sodium: 148 mEq/L — ABNORMAL HIGH (ref 137–147)

## 2013-07-16 LAB — GLUCOSE, CAPILLARY
GLUCOSE-CAPILLARY: 226 mg/dL — AB (ref 70–99)
Glucose-Capillary: 226 mg/dL — ABNORMAL HIGH (ref 70–99)
Glucose-Capillary: 227 mg/dL — ABNORMAL HIGH (ref 70–99)
Glucose-Capillary: 233 mg/dL — ABNORMAL HIGH (ref 70–99)
Glucose-Capillary: 238 mg/dL — ABNORMAL HIGH (ref 70–99)
Glucose-Capillary: 263 mg/dL — ABNORMAL HIGH (ref 70–99)
Glucose-Capillary: 275 mg/dL — ABNORMAL HIGH (ref 70–99)

## 2013-07-16 LAB — BASIC METABOLIC PANEL WITH GFR
BUN: 21 mg/dL (ref 6–23)
BUN: 14 mg/dL (ref 6–23)
CO2: 24 meq/L (ref 19–32)
CO2: 22 meq/L (ref 19–32)
Calcium: 9 mg/dL (ref 8.4–10.5)
Calcium: 8.6 mg/dL (ref 8.4–10.5)
Chloride: 115 meq/L — ABNORMAL HIGH (ref 96–112)
Chloride: 107 meq/L (ref 96–112)
Creatinine, Ser: 0.94 mg/dL (ref 0.50–1.35)
Creatinine, Ser: 1 mg/dL (ref 0.50–1.35)
GFR calc Af Amer: 85 mL/min — ABNORMAL LOW
GFR calc Af Amer: 76 mL/min — ABNORMAL LOW
GFR calc non Af Amer: 73 mL/min — ABNORMAL LOW
GFR calc non Af Amer: 65 mL/min — ABNORMAL LOW
Glucose, Bld: 275 mg/dL — ABNORMAL HIGH (ref 70–99)
Glucose, Bld: 299 mg/dL — ABNORMAL HIGH (ref 70–99)
Potassium: 3.7 meq/L (ref 3.7–5.3)
Potassium: 3.8 meq/L (ref 3.7–5.3)
Sodium: 151 meq/L — ABNORMAL HIGH (ref 137–147)
Sodium: 142 meq/L (ref 137–147)

## 2013-07-16 LAB — MRSA PCR SCREENING: MRSA by PCR: NEGATIVE

## 2013-07-16 MED ORDER — POTASSIUM CHLORIDE 20 MEQ/15ML (10%) PO LIQD
40.0000 meq | Freq: Every day | ORAL | Status: DC
  Administered 2013-07-16 – 2013-07-18 (×3): 40 meq via ORAL
  Filled 2013-07-16 (×3): qty 30

## 2013-07-16 NOTE — Progress Notes (Signed)
Inpatient Diabetes Program Recommendations  AACE/ADA: New Consensus Statement on Inpatient Glycemic Control (2013)  Target Ranges:  Prepandial:   less than 140 mg/dL      Peak postprandial:   less than 180 mg/dL (1-2 hours)      Critically ill patients:  140 - 180 mg/dL     Results for Danny Gilbert, Danny Gilbert (MRN [id number]) as of 07/16/2013 08:21  Ref. Range 07/15/2013 00:22 07/15/2013 04:23 07/15/2013 07:47 07/15/2013 10:58 07/15/2013 16:50 07/15/2013 20:27  Glucose-Capillary Latest Range: 70-99 mg/dL 147189 (H) 829140 (H) 562124 (H) 105 (H) 190 (H) 199 (H)    Results for Danny Gilbert, Danny Gilbert (MRN [id number]) as of 07/16/2013 08:21  Ref. Range 07/15/2013 23:59 07/16/2013 05:54 07/16/2013 07:35  Glucose-Capillary Latest Range: 70-99 mg/dL 696226 (H) 295226 (H) 284238 (H)    Diabetes history: Diagnosed with DM on 07/11/13 at SNF [facility]) Diagnosed by Dr. Oneal GroutMahima Pandey   Outpatient Diabetes medications: Lantus 10 units QHS + Novolog 5 units tid with meals for CBG >200 mg/dl   Current orders: Lantus 10 units QHS + Novolog Sensitive SSI Q4 hours    **Noted patient admitted with AMS/Dehydration/Increased Na+ level. Per chart review, patient was diagnosed with DM on 02/13 by Dr. Glade LloydPandey at the SNF.    **CBGs on the rise today.  Fasting glucose elevated this morning.  Patient is still NPO until swallow evaluation completed.    **MD- Please consider the following:  1. May want to increase Lantus dose to 14 units QHS (this dose would be 0.2 units/kg dosing based on his weight of 72 kg) 2. Would continue Novolog Sensitive SSI Q4 hours- May need to increase to Moderate SSI Q4 hours if patient continues to have sustained glucose elevations     Will follow. Ambrose FinlandJeannine Johnston Naja Apperson RN, MSN, CDE Diabetes Coordinator Inpatient Diabetes Program Team Pager: [phone] (8a-10p)

## 2013-07-16 NOTE — Progress Notes (Signed)
Clinical Social Work Department BRIEF PSYCHOSOCIAL ASSESSMENT 07/16/2013  Patient:  Danny Gilbert,Danny Gilbert     Account Number:  [id number]     Admit date:  07/14/2013  Clinical Social Worker:  Harless NakayamaAMBELAL,Elijah Phommachanh, LCSWA  Date/Time:  07/16/2013 10:30 AM  Referred by:  Physician  Date Referred:  07/16/2013 Referred for  SNF Placement   Other Referral:   Interview type:  Family Other interview type:   Spoke with pt daughter on the phone    PSYCHOSOCIAL DATA Living Status:  FACILITY Admitted from facility:  [facility], [place] Level of care:  Skilled Nursing Facility Primary support name:  Danny Gilbert [phone] Primary support relationship to patient:  SPOUSE Degree of support available:   Pt has supportive family    CURRENT CONCERNS Current Concerns  Post-Acute Placement   Other Concerns:    SOCIAL WORK ASSESSMENT / PLAN CSW made aware by RN that pt was admitted from facility. CSW attempted to call pt wife but unable to reach. CSW called pt daughter and confirmed [facility]. Pt daughter reports that pt is Gilbert long term resident at the facility and the plan will be for pt to return at time of dc. Pt daughter reports no concerns at this time. Pt daughter was wanting an update so CSW informed pt daughter that RN would be asked to please call and update pt when possible. Pt daughter was thankful for this. CSW provided pt family with contact information and asked to please call if new questions or concerns arise.   Assessment/plan status:  Psychosocial Support/Ongoing Assessment of Needs Other assessment/ plan:   Information/referral to community resources:   None needed    PATIENT'S/FAMILY'S RESPONSE TO PLAN OF CARE: [facility]       Danny [facility], LCSWA [phone]

## 2013-07-16 NOTE — Progress Notes (Signed)
Patient ID: Danny Gilbert  male  JOA:[id number]    DOB: 04/14/1924    DOA: 07/14/2013  PCP: Bufford SpikesEED, TIFFANY, DO  Subjective: Patient alert and awake, has dysarthria, no family members at bedside.  Assessment/Plan:  Dehydration with severe hypernatremia:  - Due to poor oral intake, Lasix and newly diagnosed diabetes mellitus  - Continue D5 water, has received more than 3 L of normal saline, continue BMET every 8 hours - Sodium improving steadily, creatinine also back to normal. Expect normal sodium by a.m.   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 - Continue Plavix - PT/OT consult  Dysphagia -This is late effects of previous CVA - SLP consult recommended dysphagia 2 with a thin liquids  Seizures - Continue Tegretol  Dementia - Continue Celexa, Tegretol, BuSpar, Aricept  Acute encephalopathy - Significantly improving, likely worsened due to acute hypernatremia/dehydration   DVT Prophylaxis:  Code Status:  Family Communication:Discussed in detail with patient's wife, son and another family member at the bedside  Disposition: SNF when ready     Objective: Weight change: -4.853 kg (-10 lb 11.2 oz)  Intake/Output Summary (Last 24 hours) at 07/16/13 1554 Last data filed at 07/15/13 1923  Gross per 24 hour  Intake 1002.08 ml  Output      1 ml  Net 1001.08 ml   Blood pressure 135/56, pulse 103, temperature 98.8 F (37.1 C), temperature source Oral, resp. rate 18, height 5\' 8"  (1.727 m), weight 72.258 kg (159 lb 4.8 oz), SpO2 100.00%.  Physical Exam: General: Alert and awake, oriented , not in any acute distress., Dry mucosal membranes  CVS: S1-S2 clear, no murmur rubs or  gallops Chest: clear to auscultation bilaterally, no wheezing, rales or rhonchi Abdomen: soft nontender, nondistended, normal bowel sounds  Extremities: no cyanosis, clubbing or edema noted bilaterally Neuro:  right-sided hemiplegia and dysarthria  Lab Results: Basic Metabolic Panel:  Recent Labs Lab 07/16/13 0550 07/16/13 1230  NA 151* 148*  K 3.7 3.5*  CL 115* 112  CO2 24 21  GLUCOSE 275* 280*  BUN 21 16  CREATININE 0.94 0.89  CALCIUM 9.0 8.8   Liver Function Tests:  Recent Labs Lab 07/14/13 0904  AST 14  ALT 17  ALKPHOS 155*  BILITOT 0.6  PROT 8.3  ALBUMIN 3.5   No results found for this basename: LIPASE, AMYLASE,  in the last 168 hours No results found for this basename: AMMONIA,  in the last 168 hours CBC:  Recent Labs Lab 07/14/13 0904 07/14/13 1502  WBC 9.1 8.5  HGB 14.4 13.6  HCT 43.9 42.3  MCV 104.8* 106.3*  PLT 153 130*   Cardiac Enzymes: No results found for this basename: CKTOTAL, CKMB, CKMBINDEX, TROPONINI,  in the last 168 hours BNP: No components found with this basename: POCBNP,  CBG:  Recent Labs Lab 07/15/13 2027 07/15/13 2359 07/16/13 0554 07/16/13 0735 07/16/13 1123  GLUCAP 199* 226* 226* 238* 233*     Micro Results: Recent Results (from the past 240 hour(s))  MRSA PCR SCREENING     Status: None   Collection Time    07/16/13  8:06 AM      Result Value Ref Range Status   MRSA by PCR NEGATIVE  NEGATIVE Final  Comment:            The GeneXpert MRSA Assay (FDA     approved for NASAL specimens     only), is one component of a     comprehensive MRSA colonization     surveillance program. It is not     intended to diagnose MRSA     infection nor to guide or     monitor treatment for     MRSA infections.    Studies/Results: Ct Head Wo Contrast  07/14/2013   CLINICAL DATA:  Altered mental status.  EXAM: CT HEAD WITHOUT CONTRAST  TECHNIQUE: Contiguous axial images were obtained from the base of the skull through the  vertex without intravenous contrast.  COMPARISON:  CT scan dated 03/12/2010 and MRI of the brain dated 06/02/2010  FINDINGS: No mass lesion. No midline shift. No acute hemorrhage or hematoma. No extra-axial fluid collections. No evidence of acute infarction. There are numerous old cerebellar and cerebral infarcts with secondary encephalomalacia. There is diffuse cerebral cortical atrophy with slight increased dilatation of the ventricles since the prior exam.  No osseous abnormality.  IMPRESSION: 1. No acute intracranial abnormality. 2. Numerous old infarcts. 3. Progressive diffuse atrophy.   Electronically Signed   By: Geanie Cooley M.D.   On: 07/14/2013 10:00   Dg Chest Portable 1 View  07/14/2013   CLINICAL DATA:  Acute mental status changes. Prior stroke with hemiplegia.  EXAM: PORTABLE CHEST - 1 VIEW  COMPARISON:  CT CHEST W/CM dated 03/13/2010; DG CHEST 2 VIEW dated 03/13/2010; DG CHEST 2 VIEW dated 12/14/2006; DG CHEST 1V PORT dated 10/10/2006  FINDINGS: Suboptimal inspiration accounts for crowded bronchovascular markings diffusely and atelectasis in the bases, and accentuates the cardiac silhouette. Taking this into account, cardiac silhouette normal in size, unchanged. Thoracic aorta mildly tortuous and atherosclerotic, unchanged. Hilar and mediastinal contours otherwise unremarkable. Lungs otherwise clear. No localized airspace consolidation. No pleural effusions. No pneumothorax. Normal pulmonary vascularity.  IMPRESSION: Suboptimal inspiration accounts for bibasilar atelectasis. No acute cardiopulmonary disease otherwise.   Electronically Signed   By: Hulan Saas M.D.   On: 07/14/2013 09:36    Medications: Scheduled Meds: . antiseptic oral rinse  15 mL Mouth Rinse q12n4p  . baclofen  10 mg Oral BID  . busPIRone  10 mg Oral BID  . carbamazepine  300 mg Oral BID  . carbamazepine  200 mg Oral Q24H  . chlorhexidine  15 mL Mouth Rinse BID  . citalopram  20 mg Oral Daily  . clopidogrel  75 mg  Oral Daily  . donepezil  10 mg Oral QHS  . enoxaparin (LOVENOX) injection  40 mg Subcutaneous Q24H  . insulin aspart  0-9 Units Subcutaneous 6 times per day  . insulin glargine  10 Units Subcutaneous QHS  . Memantine HCl ER  1 capsule Oral Daily  . polyethylene glycol  17 g Oral Daily  . polyvinyl alcohol  1 drop Both Eyes QID  . sennosides-docusate sodium  1 tablet Oral BID      LOS: 2 days   [facility] A M.D. [facility] 07/16/2013, 3:54 PM Pager: [phone]  If 7PM-7AM, please contact night-coverage [email or web address] Password [email or web address]

## 2013-07-16 NOTE — Procedures (Signed)
Objective Swallowing Evaluation: Modified Barium Swallowing Study  Patient Details  Name: Danny Gilbert MRN: [id number] Date of Birth: 03/12/1924  Today's Date: 07/16/2013 Time: [id number] SLP Time Calculation (min): 15 min  Past Medical History:  Past Medical History  Diagnosis Date  . Hyperlipidemia   . Depression   . Seizures   . Hypokalemia   . Dementia   . Hemiplegia affecting dominant side, late effect of cerebrovascular disease   . Constipation   . Edema   . Diabetes mellitus without complication    Past Surgical History: History reviewed. No pertinent past surgical history. HPI:  Danny Festeraul A Rodwell is a 80 y.o. male with PMH of HTN, HPL, h/o CVA with R sided paralysis, seizures, recent Dx with DM, dementia presented with progressive encephalopathy, lethargy, poor oral intake, dehydration and found to have hyper Na; per his wife, patient has dementia, CVA and NH resident;  He was refusing to take Po intake for few weeks, progressively getting worse, weak, lethargic; no nausea, vomiting, no abdominal pain, no chest pain, no SOB, no fever; no new focal weaknesses. CXR clear.      Assessment / Plan / Recommendation Clinical Impression  Dysphagia Diagnosis: Mild oral phase dysphagia;Mild pharyngeal phase dysphagia;Moderate pharyngeal phase dysphagia Clinical impression: Patients presents with a moderate sensory-motor based oropharyngeal dysphagia. Oral phase marked by oral delays and residue due to combination of weakness and AMS. Pharyngeally, patient with a delay in swallow initiation resulting in flash penetration of thin liquids in 1 out of 10 trials and mild-moderate pharyngeal residuals post swallow. Patient without aspiration episodes although residuals do increase risk fas patient required max clinician cueing (verbal, visual) for use of dry swallow in attempts to clear. Recommend initiation of a dysphagia 2 diet with thin liquids with strict aspiration precautions. SLP will f/u  for tolerance.     Treatment Recommendation  Therapy as outlined in treatment plan below    Diet Recommendation Dysphagia 2 (Fine chop);Thin liquid   Liquid Administration via: Cup;Straw Medication Administration: Crushed with puree Supervision: Patient able to self feed;Full supervision/cueing for compensatory strategies;Staff to assist with self feeding Compensations: Slow rate;Small sips/bites Postural Changes and/or Swallow Maneuvers: Seated upright 90 degrees    Other  Recommendations Oral Care Recommendations: Oral care BID   Follow Up Recommendations  Skilled Nursing facility    Frequency and Duration min 2x/week  2 weeks        General HPI: Danny Festeraul A Teutsch is a 80 y.o. male with PMH of HTN, HPL, h/o CVA with R sided paralysis, seizures, recent Dx with DM, dementia presented with progressive encephalopathy, lethargy, poor oral intake, dehydration and found to have hyper Na; per his wife, patient has dementia, CVA and NH resident;  He was refusing to take Po intake for few weeks, progressively getting worse, weak, lethargic; no nausea, vomiting, no abdominal pain, no chest pain, no SOB, no fever; no new focal weaknesses. CXR clear.  Type of Study: Modified Barium Swallowing Study Reason for Referral: Objectively evaluate swallowing function Previous Swallow Assessment: noen in chart Diet Prior to this Study: NPO Temperature Spikes Noted: No Respiratory Status: Nasal cannula History of Recent Intubation: No Behavior/Cognition: Alert;Confused Oral Cavity - Dentition: Dentures, top (missing bottom dentition) Oral Motor / Sensory Function: Impaired motor (reduced right) Oral impairment: Right lingual;Right labial Self-Feeding Abilities: Able to feed self;Needs assist Patient Positioning: Upright in chair Baseline Vocal Quality: Low vocal intensity Volitional Cough: Strong Volitional Swallow: Able to elicit Anatomy: Other (Comment) (? ostephytes at  C5-7 (MD not present to  confirm)) Pharyngeal Secretions: Not observed secondary MBS    Reason for Referral Objectively evaluate swallowing function   Oral Phase Oral Preparation/Oral Phase Oral Phase: Impaired Oral - Honey Oral - Honey Teaspoon: Reduced posterior propulsion;Lingual/palatal residue;Delayed oral transit Oral - Nectar Oral - Nectar Teaspoon: Reduced posterior propulsion;Lingual/palatal residue;Delayed oral transit Oral - Nectar Cup: Reduced posterior propulsion;Lingual/palatal residue;Delayed oral transit Oral - Thin Oral - Thin Cup: Reduced posterior propulsion;Lingual/palatal residue;Delayed oral transit;Right anterior bolus loss Oral - Thin Straw: Reduced posterior propulsion;Lingual/palatal residue;Delayed oral transit Oral - Solids Oral - Puree: Reduced posterior propulsion;Lingual/palatal residue;Delayed oral transit;Lingual pumping Oral - Mechanical Soft: Reduced posterior propulsion;Lingual/palatal residue;Delayed oral transit;Lingual pumping;Incomplete tongue to palate contact (palatal residuals) Oral Phase - Comment Oral Phase - Comment: likely impacted by both oral weakness and AMS   Pharyngeal Phase Pharyngeal Phase Pharyngeal Phase: Impaired Pharyngeal - Honey Pharyngeal - Honey Teaspoon: Delayed swallow initiation;Premature spillage to valleculae;Pharyngeal residue - valleculae;Pharyngeal residue - pyriform sinuses;Reduced tongue base retraction;Reduced laryngeal elevation;Reduced airway/laryngeal closure Pharyngeal - Nectar Pharyngeal - Nectar Teaspoon: Delayed swallow initiation;Premature spillage to valleculae;Pharyngeal residue - valleculae;Pharyngeal residue - pyriform sinuses;Reduced tongue base retraction;Reduced laryngeal elevation;Reduced airway/laryngeal closure Pharyngeal - Nectar Cup: Delayed swallow initiation;Premature spillage to valleculae;Pharyngeal residue - valleculae;Pharyngeal residue - pyriform sinuses;Reduced tongue base retraction;Reduced laryngeal  elevation;Reduced airway/laryngeal closure Pharyngeal - Thin Pharyngeal - Thin Cup: Delayed swallow initiation;Premature spillage to valleculae;Pharyngeal residue - valleculae;Pharyngeal residue - pyriform sinuses;Reduced tongue base retraction;Reduced laryngeal elevation;Reduced airway/laryngeal closure;Penetration/Aspiration before swallow Penetration/Aspiration details (thin cup): Material enters airway, remains ABOVE vocal cords then ejected out (flash penetration) Pharyngeal - Thin Straw: Delayed swallow initiation;Premature spillage to valleculae;Pharyngeal residue - valleculae;Pharyngeal residue - pyriform sinuses;Reduced tongue base retraction;Reduced laryngeal elevation;Reduced airway/laryngeal closure Pharyngeal - Solids Pharyngeal - Puree: Delayed swallow initiation;Premature spillage to valleculae;Pharyngeal residue - valleculae;Pharyngeal residue - pyriform sinuses;Reduced tongue base retraction;Reduced laryngeal elevation;Reduced airway/laryngeal closure Pharyngeal - Mechanical Soft: Delayed swallow initiation;Premature spillage to valleculae;Pharyngeal residue - valleculae;Pharyngeal residue - pyriform sinuses;Reduced tongue base retraction;Reduced laryngeal elevation;Reduced airway/laryngeal closure  Cervical Esophageal Phase    GO    Cervical Esophageal Phase Cervical Esophageal Phase: Impaired Cervical Esophageal Phase - Comment Cervical Esophageal Comment:  (? CP bar)        Ferdinand Lango MA, CCC-SLP [phone]  Caleb Decock Meryl 07/16/2013, 11:03 AM

## 2013-07-17 DIAGNOSIS — E1165 Type 2 diabetes mellitus with hyperglycemia: Secondary | ICD-10-CM | POA: Diagnosis present

## 2013-07-17 DIAGNOSIS — IMO0002 Reserved for concepts with insufficient information to code with codable children: Secondary | ICD-10-CM | POA: Diagnosis present

## 2013-07-17 DIAGNOSIS — R7309 Other abnormal glucose: Secondary | ICD-10-CM

## 2013-07-17 LAB — GLUCOSE, CAPILLARY
GLUCOSE-CAPILLARY: 169 mg/dL — AB (ref 70–99)
GLUCOSE-CAPILLARY: 195 mg/dL — AB (ref 70–99)
GLUCOSE-CAPILLARY: 263 mg/dL — AB (ref 70–99)
Glucose-Capillary: 192 mg/dL — ABNORMAL HIGH (ref 70–99)
Glucose-Capillary: 152 mg/dL — ABNORMAL HIGH (ref 70–99)

## 2013-07-17 LAB — BASIC METABOLIC PANEL
BUN: 11 mg/dL (ref 6–23)
CHLORIDE: 112 meq/L (ref 96–112)
CO2: 22 mEq/L (ref 19–32)
CREATININE: 0.93 mg/dL (ref 0.50–1.35)
Calcium: 9 mg/dL (ref 8.4–10.5)
GFR calc Af Amer: 85 mL/min — ABNORMAL LOW (ref >90)
GFR calc non Af Amer: 73 mL/min — ABNORMAL LOW (ref >90)
GLUCOSE: 210 mg/dL — AB (ref 70–99)
POTASSIUM: 3.4 meq/L — AB (ref 3.7–5.3)
Sodium: 146 mEq/L (ref 137–147)

## 2013-07-17 NOTE — Evaluation (Signed)
Physical Therapy Evaluation Patient Details Name: Minerva Festeraul A Cornman MRN: [id number] DOB: 11/14/1923 Today's Date: 07/17/2013 Time: [id number] PT Time Calculation (min): 20 min  PT Assessment / Plan / Recommendation History of Present Illness  80 y.o. male [facility] with PMH of HTN, HPL, h/o CVA with R sided paralysis, seizures, recent Dx with DM, dementia presented with progressive encephalopathy, lethargy, poor oral intake, dehydration and found to have hyper Na; per his wife, patient has dementia, CVA and NH resident;  He was refusing to take Po intake for few weeks, progressively getting worse, weak, lethargic; no nausea, vomiting, no abdominal pain, no chest pain, no SOB, no fever; no new focal weaknesses. CXR clear.  Head CT negative for new acute changes.    Clinical Impression  Pt has old right hemiparesis and is very impaired in his mobility.  I am not sure of what his baseline is as family was not present during my session, but it was likely max to total assist.  Pt is appropriate for return to SNF at discharge.   PT to follow acutely for deficits listed below.       PT Assessment  Patient needs continued PT services    Follow Up Recommendations  SNF    Does the patient have the potential to tolerate intense rehabilitation     NA  Barriers to Discharge   None      Equipment Recommendations  None recommended by PT    Recommendations for Other Services   None  Frequency Min 2X/week    Precautions / Restrictions Precautions Precautions: Fall Precaution Comments: right hemiperesis   Pertinent Vitals/Pain See vitals flow sheet.       Mobility  Bed Mobility Overal bed mobility: Needs Assistance Bed Mobility: Supine to Sit;Sit to Supine Supine to sit: Total assist Sit to supine: +2 for physical assistance;Total assist General bed mobility comments: Pt actively moving left arm and leg, but unable to initiate or help with transitions.          PT Diagnosis: Difficulty walking;Abnormality of gait;Generalized weakness;Hemiplegia dominant side;Altered mental status  PT Problem List: Decreased strength;Decreased activity tolerance;Decreased balance;Decreased mobility;Decreased cognition;Decreased knowledge of use of DME PT Treatment Interventions: DME instruction;Functional mobility training;Therapeutic activities;Therapeutic exercise;Balance training;Neuromuscular re-education;Cognitive remediation;Patient/family education     PT Goals(Current goals can be found in the care plan section) Acute Rehab PT Goals Patient Stated Goal: none stated.  Only heard pt say "yeah" periodically during session.  PT Goal Formulation: Patient unable to participate in goal setting Time For Goal Achievement: 07/31/13 Potential to Achieve Goals: Good  Visit Information  Last PT Received On: 07/17/13 Assistance Needed: +2 History of Present Illness: 80 y.o. male [facility] with PMH of HTN, HPL, h/o CVA with R sided paralysis, seizures, recent Dx with DM, dementia presented with progressive encephalopathy, lethargy, poor oral intake, dehydration and found to have hyper Na; per his wife, patient has dementia, CVA and NH resident;  He was refusing to take Po intake for few weeks, progressively getting worse, weak, lethargic; no nausea, vomiting, no abdominal pain, no chest pain, no SOB, no fever; no new focal weaknesses. CXR clear.  Head CT negative for new acute changes.         Prior Functioning  Home Living Family/patient expects to be discharged to:: Skilled nursing facility Additional Comments: per RN [facility]  Prior Function Level  of Independence: Needs assistance Comments: based on his presentation he was likely max to total assist before.  Communication Communication: Expressive difficulties    Cognition  Cognition Arousal/Alertness:  Awake/alert Behavior During Therapy: WFL for tasks assessed/performed Overall Cognitive Status: No family/caregiver present to determine baseline cognitive functioning    Extremity/Trunk Assessment Upper Extremity Assessment Upper Extremity Assessment: RUE deficits/detail RUE Deficits / Details: right hemiperesis from previous h/o stroke Lower Extremity Assessment Lower Extremity Assessment: RLE deficits/detail RLE Deficits / Details: right hemiperesis from previous h/o stroke Cervical / Trunk Assessment Cervical / Trunk Assessment: Kyphotic;Other exceptions (with forward head) Cervical / Trunk Exceptions: left gaze preference   Balance Balance Overall balance assessment: Needs assistance Sitting-balance support: Single extremity supported;No upper extremity supported;Feet supported Sitting balance-Leahy Scale: Zero Sitting balance - Comments: pt required max to total assist to sit EOB and attempt to eat dinner.  Pt with hard right and posterior lean at times he could get up to max assist sitting balance EOB.  Postural control: Posterior lean;Right lateral lean General Comments General comments (skin integrity, edema, etc.): While sitting EOB working on sitting tolerance, postural control and activity endurace assisted in feeding pt with cues and rules posted in room by SLP.  Pt seemed to be doing well chewing his food and swallowing (audible) without coughing, however, food started falling out of the right side of his mouth as he continued to chew and try to eat.  I would say only 25% of the food put in his mouth was actually getting swallowed.  It was likely too effortful for him to attempt in fully upright sitting without his back supported.    End of Session PT - End of Session Activity Tolerance: Patient limited by fatigue Patient left: in bed;with call bell/phone within reach;with nursing/sitter in room Nurse Communication: Mobility status;Need for lift equipment    Charmelle Soh B.  Shaylon Gillean, PT, DPT [phone]   07/17/2013, 5:55 PM

## 2013-07-17 NOTE — Progress Notes (Signed)
Patient ID: Danny Gilbert  male  [name]:[id number]    DOB: 26-Jan-1924    DOA: 07/14/2013  PCP: Bufford Spikes, DO  Subjective: Patient alert and awake, has dysarthria, getting fed by the nurse tech.  Assessment/Plan:  Dehydration with severe hypernatremia:  - Due to poor oral intake, Lasix and newly diagnosed diabetes mellitus  - Continue D5W, has received more than 3 L of normal saline, continue BMET every 8 hours - Sodium within normal limits, as continue D5W, expect better glycemic control after discontinuation of D5W infusion. -Check BMP in the morning, likely to be discharged in the morning.  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 - Continue Plavix - PT/OT consult  Dysphagia -This is late effects of previous CVA - SLP consult recommended dysphagia 2 with a thin liquids  Seizures - Continue Tegretol  Dementia - Continue Celexa, Tegretol, BuSpar, Aricept  Acute encephalopathy - Significantly improving, likely worsened due to acute hypernatremia/dehydration   DVT Prophylaxis:  Code Status:  Family Communication:Discussed in detail with patient's wife, son and another family member at the bedside  Disposition: SNF when ready     Objective: Weight change: 6.804 kg (15 lb)  Intake/Output Summary (Last 24 hours) at 07/17/13 1050 Last data filed at 07/17/13 0900  Gross per 24 hour  Intake   1655 ml  Output      1 ml  Net   1654 ml   Blood pressure 129/60, pulse 86, temperature 98.5 F (36.9 C), temperature source Oral, resp. rate 18, height 5\' 8"  (1.727 m), weight 79.062 kg (174 lb 4.8 oz), SpO2 94.00%.  Physical Exam: General: Alert and awake, oriented , not in any acute  distress., Dry mucosal membranes  CVS: S1-S2 clear, no murmur rubs or gallops Chest: clear to auscultation bilaterally, no wheezing, rales or rhonchi Abdomen: soft nontender, nondistended, normal bowel sounds  Extremities: no cyanosis, clubbing or edema noted bilaterally Neuro:  right-sided hemiplegia and dysarthria  Lab Results: Basic Metabolic Panel:  Recent Labs Lab 07/16/13 2140 07/17/13 0500  NA 142 146  K 3.8 3.4*  CL 107 112  CO2 22 22  GLUCOSE 299* 210*  BUN 14 11  CREATININE 1.00 0.93  CALCIUM 8.6 9.0   Liver Function Tests:  Recent Labs Lab 07/14/13 0904  AST 14  ALT 17  ALKPHOS 155*  BILITOT 0.6  PROT 8.3  ALBUMIN 3.5   No results found for this basename: LIPASE, AMYLASE,  in the last 168 hours No results found for this basename: AMMONIA,  in the last 168 hours CBC:  Recent Labs Lab 07/14/13 0904 07/14/13 1502  WBC 9.1 8.5  HGB 14.4 13.6  HCT 43.9 42.3  MCV 104.8* 106.3*  PLT 153 130*   Cardiac Enzymes: No results found for this basename: CKTOTAL, CKMB, CKMBINDEX, TROPONINI,  in the last 168 hours BNP: No components found with this basename: POCBNP,  CBG:  Recent Labs Lab 07/16/13 1636 07/16/13 2039 07/16/13 2342 07/17/13 0351 07/17/13 0751  GLUCAP 275* 263* 227* 169* 195*     Micro Results: Recent Results (from the past 240 hour(s))  MRSA PCR SCREENING     Status: None   Collection Time    07/16/13  8:06 AM  Result Value Ref Range Status   MRSA by PCR NEGATIVE  NEGATIVE Final   Comment:            The GeneXpert MRSA Assay (FDA     approved for NASAL specimens     only), is one component of a     comprehensive MRSA colonization     surveillance program. It is not     intended to diagnose MRSA     infection nor to guide or     monitor treatment for     MRSA infections.    Studies/Results: Ct Head Wo Contrast  07/14/2013   CLINICAL DATA:  Altered mental status.  EXAM: CT HEAD WITHOUT CONTRAST  TECHNIQUE: Contiguous  axial images were obtained from the base of the skull through the vertex without intravenous contrast.  COMPARISON:  CT scan dated 03/12/2010 and MRI of the brain dated 06/02/2010  FINDINGS: No mass lesion. No midline shift. No acute hemorrhage or hematoma. No extra-axial fluid collections. No evidence of acute infarction. There are numerous old cerebellar and cerebral infarcts with secondary encephalomalacia. There is diffuse cerebral cortical atrophy with slight increased dilatation of the ventricles since the prior exam.  No osseous abnormality.  IMPRESSION: 1. No acute intracranial abnormality. 2. Numerous old infarcts. 3. Progressive diffuse atrophy.   Electronically Signed   By: Geanie CooleyJim  Maxwell M.D.   On: 07/14/2013 10:00   Dg Chest Portable 1 View  07/14/2013   CLINICAL DATA:  Acute mental status changes. Prior stroke with hemiplegia.  EXAM: PORTABLE CHEST - 1 VIEW  COMPARISON:  CT CHEST W/CM dated 03/13/2010; DG CHEST 2 VIEW dated 03/13/2010; DG CHEST 2 VIEW dated 12/14/2006; DG CHEST 1V PORT dated 10/10/2006  FINDINGS: Suboptimal inspiration accounts for crowded bronchovascular markings diffusely and atelectasis in the bases, and accentuates the cardiac silhouette. Taking this into account, cardiac silhouette normal in size, unchanged. Thoracic aorta mildly tortuous and atherosclerotic, unchanged. Hilar and mediastinal contours otherwise unremarkable. Lungs otherwise clear. No localized airspace consolidation. No pleural effusions. No pneumothorax. Normal pulmonary vascularity.  IMPRESSION: Suboptimal inspiration accounts for bibasilar atelectasis. No acute cardiopulmonary disease otherwise.   Electronically Signed   By: Hulan Saashomas  Lawrence M.D.   On: 07/14/2013 09:36    Medications: Scheduled Meds: . antiseptic oral rinse  15 mL Mouth Rinse q12n4p  . baclofen  10 mg Oral BID  . busPIRone  10 mg Oral BID  . carbamazepine  300 mg Oral BID  . carbamazepine  200 mg Oral Q24H  . chlorhexidine  15 mL Mouth  Rinse BID  . citalopram  20 mg Oral Daily  . clopidogrel  75 mg Oral Daily  . donepezil  10 mg Oral QHS  . enoxaparin (LOVENOX) injection  40 mg Subcutaneous Q24H  . insulin aspart  0-9 Units Subcutaneous 6 times per day  . insulin glargine  10 Units Subcutaneous QHS  . Memantine HCl ER  1 capsule Oral Daily  . polyethylene glycol  17 g Oral Daily  . polyvinyl alcohol  1 drop Both Eyes QID  . potassium chloride  40 mEq Oral Daily  . sennosides-docusate sodium  1 tablet Oral BID      LOS: 3 days   [facility] LLCELMAHI,Zacharias Ridling A M.D. [facility] 07/17/2013, 10:50 AM Pager: [phone]  If 7PM-7AM, please contact night-coverage [email or web address] Password [email or web address]

## 2013-07-17 NOTE — Progress Notes (Signed)
Speech Language Pathology Treatment: Dysphagia  Patient Details Name: Danny Gilbert MRN: [id number] DOB: 07/03/1923 Today's Date: 07/17/2013 Time: 9147-82951503-1511 SLP Time Calculation (min): 8 min  Assessment / Plan / Recommendation Clinical Impression  Patient more lethargic today than during MBS complete 2/18. Able to consume clinician provided po trials without overt indication of aspiration however with increased anterior spillage and requiring max cueing for dry swallows intermittently, dry spoon utilized as verbal cueing ineffective today increasing aspiration risk.Po trials ceased. Overall however patient appears to be tolerating current diet. Recommend pos only when fully alert. Will continue to f/u to monitor for tolerance.    HPI HPI: Danny Gilbert is a 80 y.o. male with PMH of HTN, HPL, h/o CVA with R sided paralysis, seizures, recent Dx with DM, dementia presented with progressive encephalopathy, lethargy, poor oral intake, dehydration and found to have hyper Na; per his wife, patient has dementia, CVA and NH resident;  He was refusing to take Po intake for few weeks, progressively getting worse, weak, lethargic; no nausea, vomiting, no abdominal pain, no chest pain, no SOB, no fever; no new focal weaknesses. CXR clear.       SLP Plan  Continue with current plan of care    Recommendations Diet recommendations: Dysphagia 2 (fine chop);Thin liquid Liquids provided via: Cup;Straw Medication Administration: Crushed with puree Supervision: Patient able to self feed;Full supervision/cueing for compensatory strategies;Staff to assist with self feeding Compensations: Slow rate;Small sips/bites;Multiple dry swallows after each bite/sip Postural Changes and/or Swallow Maneuvers: Seated upright 90 degrees              Oral Care Recommendations: Oral care BID Follow up Recommendations: Skilled Nursing facility Plan: Continue with current plan of care    GO   [facility] Saint Hank MA,  CCC-SLP [phone]   Irie Dowson Meryl 07/17/2013, 3:12 PM

## 2013-07-17 NOTE — Progress Notes (Addendum)
Inpatient Diabetes Program Recommendations  AACE/ADA: New Consensus Statement on Inpatient Glycemic Control (2013)  Target Ranges:  Prepandial:   less than 140 mg/dL      Peak postprandial:   less than 180 mg/dL (1-2 hours)      Critically ill patients:  140 - 180 mg/dL      Results for Danny Gilbert, Danny Gilbert (MRN [id number]) as of 07/17/2013 09:12  Ref. Range 07/15/2013 23:59 07/16/2013 05:54 07/16/2013 07:35 07/16/2013 11:23 07/16/2013 16:36 07/16/2013 20:39  Glucose-Capillary Latest Range: 70-99 mg/dL 409226 (H) 811226 (H) 914238 (H) 233 (H) 275 (H) 263 (H)    Results for Danny Gilbert, Danny Gilbert (MRN [id number]) as of 07/17/2013 09:12  Ref. Range 07/16/2013 23:42 07/17/2013 03:51 07/17/2013 07:51  Glucose-Capillary Latest Range: 70-99 mg/dL 086227 (H) 578169 (H) 469195 (H)     Diabetes history: Diagnosed with DM on 07/11/13 at SNF [facility] Diagnosed by Dr. Oneal GroutMahima Pandey   Outpatient Diabetes medications: Lantus 10 units QHS + Novolog 5 units tid with meals for CBG >200 mg/dl   Current orders: Lantus 10 units QHS + Novolog Sensitive SSI Q4 hours    **Noted patient admitted with AMS/Dehydration/Increased Na+ level. Per chart review, patient was diagnosed with DM on 02/13 by Dr. Glade LloydPandey at the SNF.   **CBGs on the rise today. Fasting glucose elevated this morning.   **MD- Please consider the following:  1. May want to increase Lantus dose to 14 units QHS (this dose would be 0.2 units/kg dosing based on his weight of 72 kg)  2. Please increase Novolog SSI to Moderate scale tid ac +HS (pt now on PO diet)    Will follow. Ambrose FinlandJeannine Johnston Tequila Rottmann RN, MSN, CDE Diabetes Coordinator Inpatient Diabetes Program Team Pager: [phone] (8a-10p)

## 2013-07-18 ENCOUNTER — Inpatient Hospital Stay (HOSPITAL_COMMUNITY): Payer: Medicare Other

## 2013-07-18 DIAGNOSIS — M79609 Pain in unspecified limb: Secondary | ICD-10-CM

## 2013-07-18 LAB — BASIC METABOLIC PANEL
BUN: 10 mg/dL (ref 6–23)
BUN: 11 mg/dL (ref 6–23)
CALCIUM: 9.4 mg/dL (ref 8.4–10.5)
CHLORIDE: 112 meq/L (ref 96–112)
CO2: 22 mEq/L (ref 19–32)
CO2: 22 mEq/L (ref 19–32)
Calcium: 9.2 mg/dL (ref 8.4–10.5)
Chloride: 108 mEq/L (ref 96–112)
Creatinine, Ser: 0.93 mg/dL (ref 0.50–1.35)
Creatinine, Ser: 1.08 mg/dL (ref 0.50–1.35)
GFR calc Af Amer: 85 mL/min — ABNORMAL LOW (ref >90)
GFR calc Af Amer: 69 mL/min — ABNORMAL LOW (ref >90)
GFR calc non Af Amer: 73 mL/min — ABNORMAL LOW (ref >90)
GFR, EST NON AFRICAN AMERICAN: 60 mL/min — AB (ref >90)
GLUCOSE: 172 mg/dL — AB (ref 70–99)
GLUCOSE: 213 mg/dL — AB (ref 70–99)
POTASSIUM: 5 meq/L (ref 3.7–5.3)
POTASSIUM: 5.9 meq/L — AB (ref 3.7–5.3)
Sodium: 150 mEq/L — ABNORMAL HIGH (ref 137–147)
Sodium: 145 mEq/L (ref 137–147)

## 2013-07-18 LAB — GLUCOSE, CAPILLARY
GLUCOSE-CAPILLARY: 154 mg/dL — AB (ref 70–99)
Glucose-Capillary: 103 mg/dL — ABNORMAL HIGH (ref 70–99)
Glucose-Capillary: 128 mg/dL — ABNORMAL HIGH (ref 70–99)
Glucose-Capillary: 147 mg/dL — ABNORMAL HIGH (ref 70–99)
Glucose-Capillary: 154 mg/dL — ABNORMAL HIGH (ref 70–99)
Glucose-Capillary: 221 mg/dL — ABNORMAL HIGH (ref 70–99)

## 2013-07-18 MED ORDER — DEXTROSE 5 % IV SOLN
INTRAVENOUS | Status: DC
  Administered 2013-07-18: 11:00:00 via INTRAVENOUS

## 2013-07-18 MED ORDER — FREE WATER
250.0000 mL | Freq: Four times a day (QID) | Status: DC
  Administered 2013-07-18 (×3): 250 mL via ORAL

## 2013-07-18 MED ORDER — DEXTROSE 5 % IV SOLN
500.0000 mg | INTRAVENOUS | Status: DC
  Administered 2013-07-18 – 2013-07-20 (×3): 500 mg via INTRAVENOUS
  Filled 2013-07-18 (×4): qty 500

## 2013-07-18 MED ORDER — AZITHROMYCIN 500 MG IV SOLR
500.0000 mg | INTRAVENOUS | Status: DC
  Filled 2013-07-18: qty 500

## 2013-07-18 MED ORDER — FREE WATER: 250.0000 mL | Freq: Three times a day (TID) | Status: DC

## 2013-07-18 MED ORDER — SODIUM CHLORIDE 0.9 % IV BOLUS (SEPSIS)
500.0000 mL | Freq: Once | INTRAVENOUS | Status: AC
  Administered 2013-07-18: 500 mL via INTRAVENOUS

## 2013-07-18 MED ORDER — DEXTROSE 5 % IV SOLN
1.0000 g | INTRAVENOUS | Status: DC
  Administered 2013-07-18 – 2013-07-20 (×3): 1 g via INTRAVENOUS
  Filled 2013-07-18 (×4): qty 10

## 2013-07-18 NOTE — Discharge Summary (Signed)
Physician Discharge Summary  Danny Gilbert RCV:[id number] DOB: 11/02/1923 DOA: 07/14/2013  PCP: Danny Kinnier, DO  Admit date: 07/14/2013 Discharge date: 07/21/2013  Time spent: 40 minutes  Recommendations for Outpatient Follow-up:  1. Followup with primary care physician within one week in the nursing home. 2. Palliative service to follow the patient in the nursing home  Discharge Diagnoses:  Principal Problem:   Dehydration with hypernatremia Active Problems:   Hemiplegia affecting dominant side, late effect of cerebrovascular disease   Seizures   CVA (cerebral vascular accident)   Dementia   Acute encephalopathy   DM (diabetes mellitus), type 2, uncontrolled   Protein-calorie malnutrition, severe   Discharge Condition: stable  Diet recommendation: Carbohydrate modified diet, dysphagia 2 consistency with thin liquids  Filed Weights   07/17/13 0352 07/18/13 0500 07/19/13 0503  Weight: 79.062 kg (174 lb 4.8 oz) 72.213 kg (159 lb 3.2 oz) 71.396 kg (157 lb 6.4 oz)    History of present illness:  Danny Gilbert is a 80 y.o. male with PMH of HTN, HPL, h/o CVA with R sided paralysis, seizures, recent Dx with DM, dementia presented with progressive encephalopathy, lethargy, poor oral intake, dehydration and found to have hyper Na; per his wife, patient has dementia, CVA and NH resident; He was refusing to take Po intake for few weeks, progressively getting worse, weak, lethargic; no nausea, vomiting, no abdominal pain, no chest pain, no SOB, no fever; no new focal weaknesses   Hospital Course:   1. Hypernatremia: Hypernatremia is likely secondary to dehydration from poor intake and Lasix as well as a osmotic diuresis secondary to the recently diagnosed diabetes mellitus type 2. Patient was started on half-normal saline initially on admission but this was switched to D5W, patient's sodium level was followed every hour, the correction rate was never exceeded 8 hours the correction rate  was never exceeded 0.5 mEq per hour. Sodium back within normal limits yesterday this morning went up to 150 so patient was placed back on D5W and placed on liter of free water per day. Also Lasix was discontinued. Patient needs 250 mL of free water every 6 hours.  2. Pneumonia: Patient was about to be discharged on Friday, but he developed fever. Panculture was done including blood, urine culture and chest x-ray. Chest x-ray showed pneumonia which is likely secondary to aspiration. Patient started on Rocephin and IV azithromycin and he did well on the fever curve resolved. On discharge patient placed on Augmentin for 5 more days.  3. Recently diagnosed diabetes mellitus type 2: Hemoglobin A1c of 10.7, which likely correlate with significant dysuria and polyuria. Diabetic coordinator consult and, continued insulin throughout the hospital stay.  4. Right-sided hemiplegia: Secondary to previous CVA, patient is on Plavix, patient is a nursing home resident.  5. Dysphagia: This is a late effect of the previous CVA, also be consulted after bedside swallow evaluation and MBS,  dysphagia 2 diet with thin liquids recommended  6. Seizures: Continue Tegretol.  7. Dementia: Patient is on Tegretol, BuSpar, Aricept and Celexa all of his psychotropic medications was continued.  8. Advanced directives: Because of the dementia, dysphagia and aspiration pneumonia palliative medicine team consulted, they met with his wife Mrs. Danny Gilbert, agreed that patient should be DO NOT RESUSCITATE, but she wanted patient to be treated for now and discharged back to the nursing home. Palliative service to follow in the nursing home.  Procedures:  None  Consultations:  None  Discharge Exam: Filed Vitals:   07/21/13 1025  BP: 118/57  Pulse: 74  Temp: 98.9 F (37.2 C)  Resp: 15   General: Alert and awake, oriented x3, not in any acute distress. HEENT: anicteric sclera, pupils reactive to light and accommodation,  EOMI CVS: S1-S2 clear, no murmur rubs or gallops Chest: clear to auscultation bilaterally, no wheezing, rales or rhonchi Abdomen: soft nontender, nondistended, normal bowel sounds, no organomegaly Extremities: no cyanosis, clubbing or edema noted bilaterally Neuro: Cranial nerves II-XII intact, no focal neurological deficits  Discharge Instructions    Medication List    STOP taking these medications       furosemide 40 MG tablet  Commonly known as:  LASIX      TAKE these medications       acetaminophen 325 MG tablet  Commonly known as:  TYLENOL  Take 650 mg by mouth every 4 (four) hours as needed for mild pain or fever.     amoxicillin-clavulanate 875-125 MG per tablet  Commonly known as:  AUGMENTIN  Take 1 tablet by mouth 2 (two) times daily.     baclofen 10 MG tablet  Commonly known as:  LIORESAL  Take 10 mg by mouth 2 (two) times daily.     busPIRone 10 MG tablet  Commonly known as:  BUSPAR  Take 10 mg by mouth 2 (two) times daily.     carbamazepine 200 MG tablet  Commonly known as:  TEGRETOL  Take 200 mg by mouth daily at 12 noon.     carbamazepine 300 MG 12 hr capsule  Commonly known as:  CARBATROL  Take 300 mg by mouth 2 (two) times daily.     citalopram 20 MG tablet  Commonly known as:  CELEXA  Take 20 mg by mouth daily.     clopidogrel 75 MG tablet  Commonly known as:  PLAVIX  Take 75 mg by mouth daily.     donepezil 10 MG tablet  Commonly known as:  ARICEPT  Take 10 mg by mouth at bedtime.     free water Soln  Take 250 mLs by mouth 4 (four) times daily - after meals and at bedtime.     LANTUS SOLOSTAR 100 UNIT/ML Solostar Pen  Generic drug:  Insulin Glargine  Inject 10 Units into the skin at bedtime.     multivitamin tablet  Take 1 tablet by mouth daily.     NAMENDA XR 28 MG Cp24  Generic drug:  Memantine HCl ER  Take 1 capsule by mouth daily.     NOVOLOG FLEXPEN 100 UNIT/ML FlexPen  Generic drug:  insulin aspart  Inject 0-5 Units into  the skin 3 (three) times daily with meals. If 0-150 give 0 units.  151 + =5 units     polyethylene glycol packet  Commonly known as:  MIRALAX / GLYCOLAX  Take 17 g by mouth daily.     sennosides-docusate sodium 8.6-50 MG tablet  Commonly known as:  SENOKOT-S  Take 1 tablet by mouth 2 (two) times daily.     SYSTANE ULTRA 0.4-0.3 % Soln  Generic drug:  Polyethyl Glycol-Propyl Glycol  Apply 1 drop to eye 4 (four) times daily. Both eyes     traMADol 50 MG tablet  Commonly known as:  ULTRAM  Take 50 mg by mouth 2 (two) times daily. For 5 days due to leg pain.       No Known Allergies Follow-up Information   Follow up with REED, TIFFANY, DO In 1 week.   Specialty:  Geriatric Medicine  Contact information:   [address] [place] [address] [phone]        The results of significant diagnostics from this hospitalization (including imaging, microbiology, ancillary and laboratory) are listed below for reference.    Significant Diagnostic Studies: Ct Head Wo Contrast  07/14/2013   CLINICAL DATA:  Altered mental status.  EXAM: CT HEAD WITHOUT CONTRAST  TECHNIQUE: Contiguous axial images were obtained from the base of the skull through the vertex without intravenous contrast.  COMPARISON:  CT scan dated 03/12/2010 and MRI of the brain dated 06/02/2010  FINDINGS: No mass lesion. No midline shift. No acute hemorrhage or hematoma. No extra-axial fluid collections. No evidence of acute infarction. There are numerous old cerebellar and cerebral infarcts with secondary encephalomalacia. There is diffuse cerebral cortical atrophy with slight increased dilatation of the ventricles since the prior exam.  No osseous abnormality.  IMPRESSION: 1. No acute intracranial abnormality. 2. Numerous old infarcts. 3. Progressive diffuse atrophy.   Electronically Signed   By: Rozetta Nunnery M.D.   On: 07/14/2013 10:00   Dg Chest Portable 1 View  07/14/2013   CLINICAL DATA:  Acute mental status changes.  Prior stroke with hemiplegia.  EXAM: PORTABLE CHEST - 1 VIEW  COMPARISON:  CT CHEST W/CM dated 03/13/2010; DG CHEST 2 VIEW dated 03/13/2010; DG CHEST 2 VIEW dated 12/14/2006; DG CHEST 1V PORT dated 10/10/2006  FINDINGS: Suboptimal inspiration accounts for crowded bronchovascular markings diffusely and atelectasis in the bases, and accentuates the cardiac silhouette. Taking this into account, cardiac silhouette normal in size, unchanged. Thoracic aorta mildly tortuous and atherosclerotic, unchanged. Hilar and mediastinal contours otherwise unremarkable. Lungs otherwise clear. No localized airspace consolidation. No pleural effusions. No pneumothorax. Normal pulmonary vascularity.  IMPRESSION: Suboptimal inspiration accounts for bibasilar atelectasis. No acute cardiopulmonary disease otherwise.   Electronically Signed   By: Evangeline Dakin M.D.   On: 07/14/2013 09:36   Dg Swallowing Func-speech Pathology  07/16/2013   [facility], CCC-SLP     07/16/2013 11:03 AM Objective Swallowing Evaluation: Modified Barium Swallowing Study   Patient Details  Name: Danny Gilbert MRN: [id number] Date of Birth: 16-Feb-1924  Today's Date: 07/16/2013 Time: 3716-9678 SLP Time Calculation (min): 15 min  Past Medical History:  Past Medical History  Diagnosis Date  . Hyperlipidemia   . Depression   . Seizures   . Hypokalemia   . Dementia   . Hemiplegia affecting dominant side, late effect of  cerebrovascular disease   . Constipation   . Edema   . Diabetes mellitus without complication    Past Surgical History: History reviewed. No pertinent past  surgical history. HPI:  Danny Gilbert is a 80 y.o. male with PMH of HTN, HPL, h/o CVA  with R sided paralysis, seizures, recent Dx with DM, dementia  presented with progressive encephalopathy, lethargy, poor oral  intake, dehydration and found to have hyper Na; per his wife,  patient has dementia, CVA and NH resident;  He was refusing to  take Po intake for few weeks, progressively getting  worse, weak,  lethargic; no nausea, vomiting, no abdominal pain, no chest pain,  no SOB, no fever; no new focal weaknesses. CXR clear.      Assessment / Plan / Recommendation Clinical Impression  Dysphagia Diagnosis: Mild oral phase dysphagia;Mild pharyngeal  phase dysphagia;Moderate pharyngeal phase dysphagia Clinical impression: Patients presents with a moderate  sensory-motor based oropharyngeal dysphagia. Oral phase marked by  oral delays and residue due to combination of weakness and  AMS.  Pharyngeally, patient with a delay in swallow initiation  resulting in flash penetration of thin liquids in 1 out of 10  trials and mild-moderate pharyngeal residuals post swallow.  Patient without aspiration episodes although residuals do  increase risk fas patient required max clinician cueing (verbal,  visual) for use of dry swallow in attempts to clear. Recommend  initiation of a dysphagia 2 diet with thin liquids with strict  aspiration precautions. SLP will f/u for tolerance.     Treatment Recommendation  Therapy as outlined in treatment plan below    Diet Recommendation Dysphagia 2 (Fine chop);Thin liquid   Liquid Administration via: Cup;Straw Medication Administration: Crushed with puree Supervision: Patient able to self feed;Full supervision/cueing  for compensatory strategies;Staff to assist with self feeding Compensations: Slow rate;Small sips/bites Postural Changes and/or Swallow Maneuvers: Seated upright 90  degrees    Other  Recommendations Oral Care Recommendations: Oral care BID   Follow Up Recommendations  Skilled Nursing facility    Frequency and Duration min 2x/week  2 weeks        General HPI: Danny Gilbert is a 80 y.o. male with PMH of HTN,  HPL, h/o CVA with R sided paralysis, seizures, recent Dx with DM,  dementia presented with progressive encephalopathy, lethargy,  poor oral intake, dehydration and found to have hyper Na; per his  wife, patient has dementia, CVA and NH resident;  He was refusing  to  take Po intake for few weeks, progressively getting worse,  weak, lethargic; no nausea, vomiting, no abdominal pain, no chest  pain, no SOB, no fever; no new focal weaknesses. CXR clear.  Type of Study: Modified Barium Swallowing Study Reason for Referral: Objectively evaluate swallowing function Previous Swallow Assessment: noen in chart Diet Prior to this Study: NPO Temperature Spikes Noted: No Respiratory Status: Nasal cannula History of Recent Intubation: No Behavior/Cognition: Alert;Confused Oral Cavity - Dentition: Dentures, top (missing bottom dentition) Oral Motor / Sensory Function: Impaired motor (reduced right) Oral impairment: Right lingual;Right labial Self-Feeding Abilities: Able to feed self;Needs assist Patient Positioning: Upright in chair Baseline Vocal Quality: Low vocal intensity Volitional Cough: Strong Volitional Swallow: Able to elicit Anatomy: Other (Comment) (? ostephytes at C5-7 (MD not present to  confirm)) Pharyngeal Secretions: Not observed secondary MBS    Reason for Referral Objectively evaluate swallowing function   Oral Phase Oral Preparation/Oral Phase Oral Phase: Impaired Oral - Honey Oral - Honey Teaspoon: Reduced posterior  propulsion;Lingual/palatal residue;Delayed oral transit Oral - Nectar Oral - Nectar Teaspoon: Reduced posterior  propulsion;Lingual/palatal residue;Delayed oral transit Oral - Nectar Cup: Reduced posterior propulsion;Lingual/palatal  residue;Delayed oral transit Oral - Thin Oral - Thin Cup: Reduced posterior propulsion;Lingual/palatal  residue;Delayed oral transit;Right anterior bolus loss Oral - Thin Straw: Reduced posterior propulsion;Lingual/palatal  residue;Delayed oral transit Oral - Solids Oral - Puree: Reduced posterior propulsion;Lingual/palatal  residue;Delayed oral transit;Lingual pumping Oral - Mechanical Soft: Reduced posterior  propulsion;Lingual/palatal residue;Delayed oral transit;Lingual  pumping;Incomplete tongue to palate contact (palatal  residuals) Oral Phase - Comment Oral Phase - Comment: likely impacted by both oral weakness and  AMS   Pharyngeal Phase Pharyngeal Phase Pharyngeal Phase: Impaired Pharyngeal - Honey Pharyngeal - Honey Teaspoon: Delayed swallow initiation;Premature  spillage to valleculae;Pharyngeal residue - valleculae;Pharyngeal  residue - pyriform sinuses;Reduced tongue base retraction;Reduced  laryngeal elevation;Reduced airway/laryngeal closure Pharyngeal - Nectar Pharyngeal - Nectar Teaspoon: Delayed swallow  initiation;Premature spillage to valleculae;Pharyngeal residue -  valleculae;Pharyngeal residue - pyriform sinuses;Reduced tongue  base retraction;Reduced laryngeal elevation;Reduced  airway/laryngeal closure Pharyngeal -  Nectar Cup: Delayed swallow initiation;Premature  spillage to valleculae;Pharyngeal residue - valleculae;Pharyngeal  residue - pyriform sinuses;Reduced tongue base retraction;Reduced  laryngeal elevation;Reduced airway/laryngeal closure Pharyngeal - Thin Pharyngeal - Thin Cup: Delayed swallow initiation;Premature  spillage to valleculae;Pharyngeal residue - valleculae;Pharyngeal  residue - pyriform sinuses;Reduced tongue base retraction;Reduced  laryngeal elevation;Reduced airway/laryngeal  closure;Penetration/Aspiration before swallow Penetration/Aspiration details (thin cup): Material enters  airway, remains ABOVE vocal cords then ejected out (flash  penetration) Pharyngeal - Thin Straw: Delayed swallow initiation;Premature  spillage to valleculae;Pharyngeal residue - valleculae;Pharyngeal  residue - pyriform sinuses;Reduced tongue base retraction;Reduced  laryngeal elevation;Reduced airway/laryngeal closure Pharyngeal - Solids Pharyngeal - Puree: Delayed swallow initiation;Premature spillage  to valleculae;Pharyngeal residue - valleculae;Pharyngeal residue  - pyriform sinuses;Reduced tongue base retraction;Reduced  laryngeal elevation;Reduced airway/laryngeal closure Pharyngeal - Mechanical Soft:  Delayed swallow  initiation;Premature spillage to valleculae;Pharyngeal residue -  valleculae;Pharyngeal residue - pyriform sinuses;Reduced tongue  base retraction;Reduced laryngeal elevation;Reduced  airway/laryngeal closure  Cervical Esophageal Phase    GO    Cervical Esophageal Phase Cervical Esophageal Phase: Impaired Cervical Esophageal Phase - Comment Cervical Esophageal Comment:  (? CP bar)        Gabriel Rainwater MA, CCC-SLP [phone]  [place] Leah Meryl 07/16/2013, 11:03 AM     Microbiology: Recent Results (from the past 240 hour(s))  MRSA PCR SCREENING     Status: None   Collection Time    07/16/13  8:06 AM      Result Value Ref Range Status   MRSA by PCR NEGATIVE  NEGATIVE Final   Comment:            The GeneXpert MRSA Assay (FDA     approved for NASAL specimens     only), is one component of a     comprehensive MRSA colonization     surveillance program. It is not     intended to diagnose MRSA     infection nor to guide or     monitor treatment for     MRSA infections.  CULTURE, BLOOD (ROUTINE X 2)     Status: None   Collection Time    07/18/13  4:00 PM      Result Value Ref Range Status   Specimen Description BLOOD RIGHT ARM   Final   Special Requests BOTTLES DRAWN AEROBIC ONLY 10CC   Final   Culture  Setup Time     Final   Value: 07/18/2013 20:03     Performed at [facility]   Culture     Final   Value:        BLOOD CULTURE RECEIVED NO GROWTH TO DATE CULTURE WILL BE HELD FOR 5 DAYS BEFORE ISSUING A FINAL NEGATIVE REPORT     Performed at [facility]   Report Status PENDING   Incomplete  CULTURE, BLOOD (ROUTINE X 2)     Status: None   Collection Time    07/18/13  4:25 PM      Result Value Ref Range Status   Specimen Description BLOOD LEFT ARM   Final   Special Requests BOTTLES DRAWN AEROBIC ONLY 10CC   Final   Culture  Setup Time     Final   Value: 07/18/2013 20:02     Performed at [facility]   Culture     Final   Value:        BLOOD  CULTURE RECEIVED NO GROWTH TO DATE CULTURE WILL BE HELD FOR 5 DAYS BEFORE ISSUING A FINAL NEGATIVE REPORT  Performed at [facility]   Report Status PENDING   Incomplete     Labs: Basic Metabolic Panel:  Recent Labs Lab 07/16/13 2140 07/17/13 0500 07/18/13 0410 07/18/13 1310 07/19/13 0427  NA 142 146 150* 145 143  K 3.8 3.4* 5.0 5.9* 4.8  CL 107 112 112 108 107  CO2 _0 GLUCOSE 299* 210* 172* 213* 226*  BUN _1 CREATININE 1.00 0.93 0.93 1.08 1.54*  CALCIUM 8.6 9.0 9.2 9.4 8.7   Liver Function Tests: No results found for this basename: AST, ALT, ALKPHOS, BILITOT, PROT, ALBUMIN,  in the last 168 hours No results found for this basename: LIPASE, AMYLASE,  in the last 168 hours No results found for this basename: AMMONIA,  in the last 168 hours CBC:  Recent Labs Lab 07/14/13 1502 07/19/13 0427  WBC 8.5 8.5  HGB 13.6 9.4*  HCT 42.3 27.8*  MCV 106.3* 97.2  PLT 130* 202   Cardiac Enzymes: No results found for this basename: CKTOTAL, CKMB, CKMBINDEX, TROPONINI,  in the last 168 hours BNP: BNP (last 3 results) No results found for this basename: PROBNP,  in the last 8760 hours CBG:  Recent Labs Lab 07/20/13 2018 07/20/13 2157 07/21/13 0006 07/21/13 0423 07/21/13 0737  GLUCAP 274* 232* 190* 119* 114*       Signed:  Xaine Sansom A  [facility] 07/21/2013, 11:40 AM

## 2013-07-18 NOTE — Progress Notes (Signed)
Patient ID: Danny Gilbert A Takahashi  male  ZOX:[id number]    DOB: 04/27/1924    DOA: 07/14/2013  PCP: Bufford SpikesEED, TIFFANY, DO  Subjective: Give of fever of 102.1, he was about to be discharged today. We will hold on discharge. Obtain chest x-ray, urinalysis and blood cultures. Started on Rocephin and azithromycin empirically.  Assessment/Plan:  Dehydration with severe hypernatremia:  - Due to poor oral intake, Lasix and newly diagnosed diabetes mellitus  - Continue D5W, has received more than 3 L of normal saline, continue BMET every 8 hours - Sodium within normal limits, as continue D5W, expect better glycemic control after discontinuation of D5W infusion. -Check BMP in the morning, likely to be discharged in the morning.  Fever -Of undetermined origin, as mentioned above chest x-ray, urinalysis and blood cultures. -Patient started empirically on Rocephin and azithromycin. Questionable aspiration.  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 - Continue Plavix - PT/OT consult  Dysphagia -This is late effects of previous CVA - SLP consult recommended dysphagia 2 with a thin liquids  Seizures - Continue Tegretol  Dementia - Continue Celexa, Tegretol, BuSpar, Aricept  Acute encephalopathy - Significantly improving, likely worsened due to acute hypernatremia/dehydration   DVT Prophylaxis:  Code Status:  Family Communication:Discussed in detail with patient's wife, son and another family member at the bedside  Disposition: SNF when ready     Objective: Weight change: -6.849 kg (-15 lb 1.6 oz) No intake or output data in the 24 hours ending 07/18/13 1506 Blood pressure 95/57, pulse 104, temperature 99.7  F (37.6 C), temperature source Oral, resp. rate 18, height 5\' 8"  (1.727 m), weight 72.213 kg (159 lb 3.2 oz), SpO2 96.00%.  Physical Exam: General: Alert and awake, oriented , not in any acute distress., Dry mucosal membranes  CVS: S1-S2 clear, no murmur rubs or gallops Chest: clear to auscultation bilaterally, no wheezing, rales or rhonchi Abdomen: soft nontender, nondistended, normal bowel sounds  Extremities: no cyanosis, clubbing or edema noted bilaterally Neuro:  right-sided hemiplegia and dysarthria  Lab Results: Basic Metabolic Panel:  Recent Labs Lab 07/18/13 0410 07/18/13 1310  NA 150* 145  K 5.0 5.9*  CL 112 108  CO2 22 22  GLUCOSE 172* 213*  BUN 10 11  CREATININE 0.93 1.08  CALCIUM 9.2 9.4   Liver Function Tests:  Recent Labs Lab 07/14/13 0904  AST 14  ALT 17  ALKPHOS 155*  BILITOT 0.6  PROT 8.3  ALBUMIN 3.5   No results found for this basename: LIPASE, AMYLASE,  in the last 168 hours No results found for this basename: AMMONIA,  in the last 168 hours CBC:  Recent Labs Lab 07/14/13 0904 07/14/13 1502  WBC 9.1 8.5  HGB 14.4 13.6  HCT 43.9 42.3  MCV 104.8* 106.3*  PLT 153 130*   Cardiac Enzymes: No results found for this basename: CKTOTAL, CKMB, CKMBINDEX, TROPONINI,  in the last 168 hours BNP: No components found with this basename: POCBNP,  CBG:  Recent Labs Lab 07/17/13 2015 07/18/13 0038 07/18/13 0458 07/18/13 0757 07/18/13 1229  GLUCAP 152* 147* 154* 128* 103*     Micro Results: Recent Results (from the past 240 hour(s))  MRSA PCR SCREENING     Status: None   Collection Time  07/16/13  8:06 AM      Result Value Ref Range Status   MRSA by PCR NEGATIVE  NEGATIVE Final   Comment:            The GeneXpert MRSA Assay (FDA     approved for NASAL specimens     only), is one component of a     comprehensive MRSA colonization     surveillance program. It is not     intended to diagnose MRSA     infection nor to guide or      monitor treatment for     MRSA infections.    Studies/Results: Ct Head Wo Contrast  07/14/2013   CLINICAL DATA:  Altered mental status.  EXAM: CT HEAD WITHOUT CONTRAST  TECHNIQUE: Contiguous axial images were obtained from the base of the skull through the vertex without intravenous contrast.  COMPARISON:  CT scan dated 03/12/2010 and MRI of the brain dated 06/02/2010  FINDINGS: No mass lesion. No midline shift. No acute hemorrhage or hematoma. No extra-axial fluid collections. No evidence of acute infarction. There are numerous old cerebellar and cerebral infarcts with secondary encephalomalacia. There is diffuse cerebral cortical atrophy with slight increased dilatation of the ventricles since the prior exam.  No osseous abnormality.  IMPRESSION: 1. No acute intracranial abnormality. 2. Numerous old infarcts. 3. Progressive diffuse atrophy.   Electronically Signed   By: Geanie Cooley M.D.   On: 07/14/2013 10:00   Dg Chest Portable 1 View  07/14/2013   CLINICAL DATA:  Acute mental status changes. Prior stroke with hemiplegia.  EXAM: PORTABLE CHEST - 1 VIEW  COMPARISON:  CT CHEST W/CM dated 03/13/2010; DG CHEST 2 VIEW dated 03/13/2010; DG CHEST 2 VIEW dated 12/14/2006; DG CHEST 1V PORT dated 10/10/2006  FINDINGS: Suboptimal inspiration accounts for crowded bronchovascular markings diffusely and atelectasis in the bases, and accentuates the cardiac silhouette. Taking this into account, cardiac silhouette normal in size, unchanged. Thoracic aorta mildly tortuous and atherosclerotic, unchanged. Hilar and mediastinal contours otherwise unremarkable. Lungs otherwise clear. No localized airspace consolidation. No pleural effusions. No pneumothorax. Normal pulmonary vascularity.  IMPRESSION: Suboptimal inspiration accounts for bibasilar atelectasis. No acute cardiopulmonary disease otherwise.   Electronically Signed   By: Hulan Saas M.D.   On: 07/14/2013 09:36    Medications: Scheduled Meds: . antiseptic  oral rinse  15 mL Mouth Rinse q12n4p  . azithromycin  500 mg Intravenous Q24H  . baclofen  10 mg Oral BID  . busPIRone  10 mg Oral BID  . carbamazepine  300 mg Oral BID  . carbamazepine  200 mg Oral Q24H  . cefTRIAXone (ROCEPHIN)  IV  1 g Intravenous Q24H  . chlorhexidine  15 mL Mouth Rinse BID  . citalopram  20 mg Oral Daily  . clopidogrel  75 mg Oral Daily  . donepezil  10 mg Oral QHS  . enoxaparin (LOVENOX) injection  40 mg Subcutaneous Q24H  . free water  250 mL Oral QID  . insulin aspart  0-9 Units Subcutaneous 6 times per day  . insulin glargine  10 Units Subcutaneous QHS  . Memantine HCl ER  1 capsule Oral Daily  . polyethylene glycol  17 g Oral Daily  . polyvinyl alcohol  1 drop Both Eyes QID  . sennosides-docusate sodium  1 tablet Oral BID      LOS: 4 days   [facility] A M.D. [facility] 07/18/2013, 3:06 PM Pager: [phone]  If 7PM-7AM, please contact night-coverage [email or web address] Password [email or web address]

## 2013-07-18 NOTE — Progress Notes (Signed)
OT Cancellation Note  Patient Details Name: [name] MRN: [id number] DOB: 05/17/1924   Cancelled Treatment:    Patient to discharge back to SNF today.  Defer OT to SNF  Ewin Rehberg 07/18/2013, 3:52 PM

## 2013-07-18 NOTE — Care Management Note (Signed)
    Page 1 of 1   07/18/2013     10:31:37 AM   CARE MANAGEMENT NOTE 07/18/2013  Patient:  Danny Gilbert,Danny Gilbert   Account Number:  [id number]  Date Initiated:  07/18/2013  Documentation initiated by:  GRAVES-BIGELOW,Massie Cogliano  Subjective/Objective Assessment:   [facility] Plan to return back today.     Action/Plan:   CSW assisting with disposition needs. No needs from CM at this time.   Anticipated DC Date:  07/18/2013   Anticipated DC Plan:  SKILLED NURSING FACILITY  In-house referral  Clinical Social Worker      DC Planning Services  CM consult      Choice offered to / List presented to:             Status of service:  Completed, signed off Medicare Important Message given?   (If response is "NO", the following Medicare IM given date fields will be blank) Date Medicare IM given:   Date Additional Medicare IM given:    Discharge Disposition:  SKILLED NURSING FACILITY  Per UR Regulation:  Reviewed for med. necessity/level of care/duration of stay  If discussed at Long Length of Stay Meetings, dates discussed:    Comments:

## 2013-07-18 NOTE — Progress Notes (Signed)
Speech Language Pathology Treatment:    Patient Details Name: Danny Gilbert MRN: [id number] DOB: 04/28/1924 Today's Date: 07/18/2013 Time: 1350-1410 SLP Time Calculation (min): 20 min  Assessment / Plan / Recommendation Clinical Impression  Pt resting in bed. No family present.  Pt's lunch tray was at the bedside, barely touched. Pt agreed to try some applesauce and iced tea, however, refused dys 2 items (cooked carrots, baked fish).  No apparent difficulty with dys 1 or thin liquids.  RN confirmed tolerance of these consistencies, and refusal of dys 2 items.  Given PT's concern this morning regarding oral leakage while sitting at EOB, will downgrade diet to dys 1 with thin liquids, as pt willing to take this consistency, and tolerates it without overt difficulty.  SLP to continue to follow for assessment of diet tolerance.   HPI HPI: Danny Gilbert is a 80 y.o. male with PMH of HTN, HPL, h/o CVA with R sided paralysis, seizures, recent Dx with DM, dementia presented with progressive encephalopathy, lethargy, poor oral intake, dehydration and found to have hyper Na; per his wife, patient has dementia, CVA and NH resident;  He was refusing to take Po intake for few weeks, progressively getting worse, weak, lethargic; no nausea, vomiting, no abdominal pain, no chest pain, no SOB, no fever; no new focal weaknesses. CXR clear.  BSE completed 07/17/13, with recommendation for dys 2/thin liquid diet.  While working with pt this morning, PT noted pt to have difficulty with oral leakage while seated at EOB   Pertinent Vitals VSS  SLP Plan  Continue with current plan of care    Recommendations Diet recommendations: Dysphagia 1 (puree);Thin liquid Liquids provided via: Straw;Cup Medication Administration: Crushed with puree Supervision: Full supervision/cueing for compensatory strategies;Staff to assist with self feeding Compensations: Slow rate;Small sips/bites;Multiple dry swallows after each  bite/sip Postural Changes and/or Swallow Maneuvers: Seated upright 90 degrees              Oral Care Recommendations: Oral care BID Follow up Recommendations: Skilled Nursing facility Plan: Continue with current plan of care    GO    Celia B. Murvin NatalBueche, [facility] For [facility] IncMSP, CCC-SLP [phone]  Leigh AuroraBueche, Celia Brown 07/18/2013, 2:15 PM

## 2013-07-18 NOTE — Progress Notes (Signed)
CSW (Clinical Child psychotherapistocial Worker) called facility and updated that pt will not be discharging back today. Pt is able to dc back over weekend if ready.   Danny Gilbert, LCSWA [phone]

## 2013-07-19 DIAGNOSIS — M62838 Other muscle spasm: Secondary | ICD-10-CM

## 2013-07-19 DIAGNOSIS — IMO0001 Reserved for inherently not codable concepts without codable children: Secondary | ICD-10-CM

## 2013-07-19 DIAGNOSIS — E1165 Type 2 diabetes mellitus with hyperglycemia: Secondary | ICD-10-CM

## 2013-07-19 LAB — GLUCOSE, CAPILLARY
Glucose-Capillary: 180 mg/dL — ABNORMAL HIGH (ref 70–99)
Glucose-Capillary: 165 mg/dL — ABNORMAL HIGH (ref 70–99)
Glucose-Capillary: 116 mg/dL — ABNORMAL HIGH (ref 70–99)
Glucose-Capillary: 178 mg/dL — ABNORMAL HIGH (ref 70–99)

## 2013-07-19 LAB — BASIC METABOLIC PANEL
BUN: 21 mg/dL (ref 6–23)
CO2: 22 mEq/L (ref 19–32)
Calcium: 8.7 mg/dL (ref 8.4–10.5)
Chloride: 107 mEq/L (ref 96–112)
Creatinine, Ser: 1.54 mg/dL — ABNORMAL HIGH (ref 0.50–1.35)
GFR calc Af Amer: 45 mL/min — ABNORMAL LOW (ref >90)
GFR, EST NON AFRICAN AMERICAN: 39 mL/min — AB (ref >90)
Glucose, Bld: 226 mg/dL — ABNORMAL HIGH (ref 70–99)
Potassium: 4.8 mEq/L (ref 3.7–5.3)
Sodium: 143 mEq/L (ref 137–147)

## 2013-07-19 LAB — CBC
HEMATOCRIT: 27.8 % — AB (ref 39.0–52.0)
Hemoglobin: 9.4 g/dL — ABNORMAL LOW (ref 13.0–17.0)
MCH: 32.9 pg (ref 26.0–34.0)
MCHC: 33.8 g/dL (ref 30.0–36.0)
MCV: 97.2 fL (ref 78.0–100.0)
Platelets: 202 10*3/uL (ref 150–400)
RBC: 2.86 MIL/uL — ABNORMAL LOW (ref 4.22–5.81)
RDW: 12.4 % (ref 11.5–15.5)
WBC: 8.5 10*3/uL (ref 4.0–10.5)

## 2013-07-19 MED ORDER — INSULIN ASPART 100 UNIT/ML ~~LOC~~ SOLN: 0.0000 [IU] | SUBCUTANEOUS | Status: DC | PRN

## 2013-07-19 MED ORDER — INSULIN ASPART 100 UNIT/ML ~~LOC~~ SOLN
0.0000 [IU] | Freq: Three times a day (TID) | SUBCUTANEOUS | Status: DC
Start: 2013-07-19 — End: 2013-07-19
  Administered 2013-07-19 (×2): 2 [IU] via SUBCUTANEOUS

## 2013-07-19 MED ORDER — INSULIN ASPART 100 UNIT/ML ~~LOC~~ SOLN
0.0000 [IU] | SUBCUTANEOUS | Status: DC
  Administered 2013-07-19: 2 [IU] via SUBCUTANEOUS
  Administered 2013-07-20: 1 [IU] via SUBCUTANEOUS
  Administered 2013-07-20: 5 [IU] via SUBCUTANEOUS
  Administered 2013-07-20 – 2013-07-21 (×3): 2 [IU] via SUBCUTANEOUS

## 2013-07-19 NOTE — Progress Notes (Signed)
Patient NW:GNFA:Emran A Liwanag      DOB: 03/10/1924      OZH:[id number]  Patient seen , wife has left.  I spoke with her by phone.  She will meet me tomorrow at 1030 am.  She had mentioned to the nurse that the family had decided on DNR .  I asked if I could make that change before we meet and she stated yes.   Recommend : DNR at family request.    Joclyn Alsobrook L. Ladona Ridgelaylor, MD MBA The Palliative Medicine Team at [facility] Team Phone: [phone] Pager: [phone]

## 2013-07-19 NOTE — Progress Notes (Signed)
Patient ID: Danny Gilbert  male  [name]:[id number]    DOB: 1924-05-09    DOA: 07/14/2013  PCP: Bufford Spikes, DO  Subjective: Was about to be discharged yesterday, patient developed fever on discharge was held. Discussed at length with his wife Mrs. [name] Doeden, the patient has poor prognosis. Explained to her that dementia, dysphagia and aspiration pneumonia usually carries very poor prognosis. Agrees to call palliative care consult for goals of care..  Assessment/Plan:  Dehydration with severe hypernatremia:  - Due to poor oral intake, Lasix and newly diagnosed diabetes mellitus  -Was on D5W, has received more than 3 L of normal saline, continue BMET every 8 hours - Sodium within normal limits, as continue D5W, expect better glycemic control after discontinuation of D5W infusion. -Sodium back to normal, was about to start an IV fluids but hold now because of family meeting with hospice.  Fever/pneumonia -Of undetermined origin, as mentioned above chest x-ray, urinalysis and blood cultures. -Patient started empirically on Rocephin and azithromycin. Questionable aspiration. -Chest x-ray showed evidence of pneumonia, likely patient aspirated as he was restarted on diet. -I. will not change antibiotics, waiting on family meeting with hospice and palliative medicine team.  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 - Continue Plavix - PT/OT consult  Dysphagia -This is late effects of previous CVA - SLP consult recommended dysphagia 2 with a thin liquids  Seizures - Continue Tegretol  Dementia - Continue Celexa, Tegretol, BuSpar, Aricept  Acute encephalopathy - Significantly improving, likely  worsened due to acute hypernatremia/dehydration   DVT Prophylaxis:  Code Status:  Family Communication:Discussed in detail with patient's wife, son and another family member at the bedside  Disposition: SNF when ready     Objective: Weight change: -0.816 kg (-1 lb 12.8 oz) No intake or output data in the 24 hours ending 07/19/13 1159 Blood pressure 107/58, pulse 94, temperature 100.7 F (38.2 C), temperature source Axillary, resp. rate 18, height 5\' 8"  (1.727 m), weight 71.396 kg (157 lb 6.4 oz), SpO2 98.00%.  Physical Exam: General: Alert and awake, oriented , not in any acute distress., Dry mucosal membranes  CVS: S1-S2 clear, no murmur rubs or gallops Chest: clear to auscultation bilaterally, no wheezing, rales or rhonchi Abdomen: soft nontender, nondistended, normal bowel sounds  Extremities: no cyanosis, clubbing or edema noted bilaterally Neuro:  right-sided hemiplegia and dysarthria  Lab Results: Basic Metabolic Panel:  Recent Labs Lab 07/18/13 1310 07/19/13 0427  NA 145 143  K 5.9* 4.8  CL 108 107  CO2 22 22  GLUCOSE 213* 226*  BUN 11 21  CREATININE 1.08 1.54*  CALCIUM 9.4 8.7   Liver Function Tests:  Recent Labs Lab 07/14/13 0904  AST 14  ALT 17  ALKPHOS 155*  BILITOT 0.6  PROT 8.3  ALBUMIN 3.5   No results found for this basename: LIPASE, AMYLASE,  in the last 168 hours No results found for this basename: AMMONIA,  in the last 168 hours CBC:  Recent Labs Lab 07/14/13 1502 07/19/13 0427  WBC 8.5 8.5  HGB 13.6 9.4*  HCT 42.3 27.8*  MCV 106.3* 97.2  PLT 130* 202   Cardiac Enzymes: No results found for this basename: CKTOTAL, CKMB, CKMBINDEX, TROPONINI,  in the last 168  hours BNP: No components found with this basename: POCBNP,  CBG:  Recent Labs Lab 07/18/13 0757 07/18/13 1229 07/18/13 1649 07/18/13 2154 07/19/13 0815  GLUCAP 128* 103* 221* 154* 180*     Micro Results: Recent Results (from the past 240 hour(s))  MRSA PCR  SCREENING     Status: None   Collection Time    07/16/13  8:06 AM      Result Value Ref Range Status   MRSA by PCR NEGATIVE  NEGATIVE Final   Comment:            The GeneXpert MRSA Assay (FDA     approved for NASAL specimens     only), is one component of a     comprehensive MRSA colonization     surveillance program. It is not     intended to diagnose MRSA     infection nor to guide or     monitor treatment for     MRSA infections.    Studies/Results: Ct Head Wo Contrast  07/14/2013   CLINICAL DATA:  Altered mental status.  EXAM: CT HEAD WITHOUT CONTRAST  TECHNIQUE: Contiguous axial images were obtained from the base of the skull through the vertex without intravenous contrast.  COMPARISON:  CT scan dated 03/12/2010 and MRI of the brain dated 06/02/2010  FINDINGS: No mass lesion. No midline shift. No acute hemorrhage or hematoma. No extra-axial fluid collections. No evidence of acute infarction. There are numerous old cerebellar and cerebral infarcts with secondary encephalomalacia. There is diffuse cerebral cortical atrophy with slight increased dilatation of the ventricles since the prior exam.  No osseous abnormality.  IMPRESSION: 1. No acute intracranial abnormality. 2. Numerous old infarcts. 3. Progressive diffuse atrophy.   Electronically Signed   By: Geanie Cooley M.D.   On: 07/14/2013 10:00   Dg Chest Portable 1 View  07/14/2013   CLINICAL DATA:  Acute mental status changes. Prior stroke with hemiplegia.  EXAM: PORTABLE CHEST - 1 VIEW  COMPARISON:  CT CHEST W/CM dated 03/13/2010; DG CHEST 2 VIEW dated 03/13/2010; DG CHEST 2 VIEW dated 12/14/2006; DG CHEST 1V PORT dated 10/10/2006  FINDINGS: Suboptimal inspiration accounts for crowded bronchovascular markings diffusely and atelectasis in the bases, and accentuates the cardiac silhouette. Taking this into account, cardiac silhouette normal in size, unchanged. Thoracic aorta mildly tortuous and atherosclerotic, unchanged. Hilar and  mediastinal contours otherwise unremarkable. Lungs otherwise clear. No localized airspace consolidation. No pleural effusions. No pneumothorax. Normal pulmonary vascularity.  IMPRESSION: Suboptimal inspiration accounts for bibasilar atelectasis. No acute cardiopulmonary disease otherwise.   Electronically Signed   By: Hulan Saas M.D.   On: 07/14/2013 09:36    Medications: Scheduled Meds: . antiseptic oral rinse  15 mL Mouth Rinse q12n4p  . azithromycin  500 mg Intravenous Q24H  . baclofen  10 mg Oral BID  . busPIRone  10 mg Oral BID  . carbamazepine  300 mg Oral BID  . carbamazepine  200 mg Oral Q24H  . cefTRIAXone (ROCEPHIN)  IV  1 g Intravenous Q24H  . chlorhexidine  15 mL Mouth Rinse BID  . citalopram  20 mg Oral Daily  . clopidogrel  75 mg Oral Daily  . donepezil  10 mg Oral QHS  . enoxaparin (LOVENOX) injection  40 mg Subcutaneous Q24H  . free water  250 mL Oral QID  . insulin aspart  0-9 Units Subcutaneous TID WC  . insulin glargine  10 Units Subcutaneous QHS  . Memantine HCl ER  1 capsule  Oral Daily  . polyethylene glycol  17 g Oral Daily  . polyvinyl alcohol  1 drop Both Eyes QID  . sennosides-docusate sodium  1 tablet Oral BID      LOS: 5 days   [facility] CampusELMAHI,Nyashia Raney A M.D. [facility] 07/19/2013, 11:59 AM Pager: [phone]  If 7PM-7AM, please contact night-coverage [email or web address] Password [email or web address]

## 2013-07-19 NOTE — Progress Notes (Signed)
Spoke with midlevel on call at this time regarding staff inability to collect UA d/t severe scrotal edema as well as retracted penile shaft with taunt foreskin. NP states that she will make MD aware and for staff not to focus the issue of collecting sample at this time. Will continue to monitor pt Danny Gilbert, Danny Gilbert

## 2013-07-20 DIAGNOSIS — Z515 Encounter for palliative care: Secondary | ICD-10-CM

## 2013-07-20 LAB — GLUCOSE, CAPILLARY
GLUCOSE-CAPILLARY: 156 mg/dL — AB (ref 70–99)
GLUCOSE-CAPILLARY: 232 mg/dL — AB (ref 70–99)
Glucose-Capillary: 105 mg/dL — ABNORMAL HIGH (ref 70–99)
Glucose-Capillary: 118 mg/dL — ABNORMAL HIGH (ref 70–99)
Glucose-Capillary: 135 mg/dL — ABNORMAL HIGH (ref 70–99)
Glucose-Capillary: 172 mg/dL — ABNORMAL HIGH (ref 70–99)
Glucose-Capillary: 274 mg/dL — ABNORMAL HIGH (ref 70–99)

## 2013-07-20 NOTE — Consult Note (Signed)
Patient ZO:XWRU:Danny Gilbert      DOB: 01/22/1924      EAV:[id number]  Summary of Goals of Care; full consult to follow:  Meet with Patient's Wife [name], Daughter [name], sister in law [name], and two daughters by first marriage [name] and [name]   [name] is more awake and alert today which makes this really difficult.  Family at this time shares that [name] was very active until 2007 when he had wt loss.  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  He progressed on in 2008 to seizure disorder and then 2011 had an overt CVA which he has never recovered from .  [facility].  Family agree that over all they don't want him to suffer but if he can be treated and recover to a level like he is today where he is happy and able to talk with them that they would want to continue to have that time and intervention.  DNR has been established and they all agree for now on comfort feeding that would have a modified aspect to prevent as much aspiration as possible.  We discussed feeding tubes and Shanon AceVashti is going to look over a [employer] for [employer] book .  They are not leaning toward FT placement but may change their minds.     Recommend:  1.  DNR  2.  Treat the treatable:  Continue antibiotics  3.  Dysphagia: continued modified diet   4.  MOST form will be completed before discharge spouse looking at that tonight  Total time:  1100 am 1146 am   Yarelli Decelles L. Ladona Ridgelaylor, MD MBA The Palliative Medicine Team at [facility] Team Phone: [phone] Pager: [phone]

## 2013-07-20 NOTE — Consult Note (Signed)
Patient Danny Gilbert      DOB: Dec 30, 1923      JKK:[id number]     Consult Note from the Palliative Medicine Team at [facility] Requested by: Dr. Ether Griffins    PCP: Hollace Kinnier, DO Reason for Consultation: [place]    Phone Number:[phone] Related symptom recommendations Assessment of patients Current state: 80 yr old [place] male with history of CVA and seizures admitted with fever. Likely recurrent aspiration.  Family met with me today, please see summary note for additional details.  Desire to treat the treatable continue antibiotics, comfort feed even if he is going to get worse pneumonia. I will remeet with spouse to complete a MOST form.   Goals of Care: 1.  Code Status: DNR affirmed   2. Scope of Treatment: Continue antibiotics and comfort feeding.  Return to hosptial if and when needed.  4. Disposition: Return to [facility] when medically stable   3. Symptom Management:   1. Pain : none reported, tylenol available 2. Hyperglycemia: continue medical management 3. Dysphagia: modified diet with aspiration precautions 4. Dementia: continue meds  4. 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  Nickname "Magiver" cause he could fix anything  5. Spiritual: will offer to family spiritual care support.        Patient Documents Completed or Given: Document Given Completed  Advanced Directives Pkt    MOST    DNR    Gone from My Sight    Hard Choices      Brief HPI: 80 yr old [place] Male admitted with aspiration pneumonia,patient with persistent aspiration . We were asked to assist with GOc.   ROS: states he is thirsty , denies pain.    PMH:  Past Medical History  Diagnosis Date  . Hyperlipidemia   . Depression   . Seizures   . Hypokalemia   . Dementia   . Hemiplegia affecting dominant side, late effect of cerebrovascular disease   . Constipation   . Edema   . Diabetes mellitus without  complication      ZJI:RCVELFY reviewed. No pertinent past surgical history. I have reviewed the [place] and SH and  If appropriate update it with new information. No Known Allergies Scheduled Meds: . antiseptic oral rinse  15 mL Mouth Rinse q12n4p  . azithromycin  500 mg Intravenous Q24H  . baclofen  10 mg Oral BID  . busPIRone  10 mg Oral BID  . carbamazepine  300 mg Oral BID  . carbamazepine  200 mg Oral Q24H  . cefTRIAXone (ROCEPHIN)  IV  1 g Intravenous Q24H  . chlorhexidine  15 mL Mouth Rinse BID  . citalopram  20 mg Oral Daily  . clopidogrel  75 mg Oral Daily  . donepezil  10 mg Oral QHS  . enoxaparin (LOVENOX) injection  40 mg Subcutaneous Q24H  . free water  250 mL Oral QID  . insulin aspart  0-9 Units Subcutaneous 6 times per day  . insulin glargine  10 Units Subcutaneous QHS  . Memantine HCl ER  1 capsule Oral Daily  . polyethylene glycol  17 g Oral Daily  . polyvinyl alcohol  1 drop Both Eyes QID  . sennosides-docusate sodium  1 tablet Oral BID   Continuous Infusions:  PRN Meds:.acetaminophen, acetaminophen, ondansetron (ZOFRAN) IV, ondansetron    BP 117/53  Pulse 81  Temp(Src) 98.2 F (36.8 C) (Axillary)  Resp 16  Ht _0  (1.727 m)  Wt  71.396 kg (157 lb 6.4 oz)  BMI 23.94 kg/m2  SpO2 97%   PPS: 20-30%   Intake/Output Summary (Last 24 hours) at 07/20/13 1146 Last data filed at 07/20/13 1000  Gross per 24 hour  Intake    240 ml  Output      2 ml  Net    238 ml   LBM: 2/22                    Physical Exam:  General: Awake, alert oriented to self and recognizes family HEENT:  PERRL, EOMI, anciteric mm dry Chest:   Decreased with right greater than left rhonchi, no wheezing CVS: regular rate and rhythm, S1, S2 Abdomen:soft, not tender, positive bowel sounds Ext: warm, dry flaky skin no edema Neuro:oriented to self , not time or place  Labs: CBC    Component Value Date/Time   WBC 8.5 07/19/2013 0427   RBC 2.86* 07/19/2013 0427   HGB 9.4* 07/19/2013  0427   HCT 27.8* 07/19/2013 0427   PLT 202 07/19/2013 0427   MCV 97.2 07/19/2013 0427   MCH 32.9 07/19/2013 0427   MCHC 33.8 07/19/2013 0427   RDW 12.4 07/19/2013 0427   LYMPHSABS 2.7 03/12/2010 1355   MONOABS 0.7 03/12/2010 1355   EOSABS 0.1 03/12/2010 1355   BASOSABS 0.0 03/12/2010 1355      CMP     Component Value Date/Time   NA 143 07/19/2013 0427   K 4.8 07/19/2013 0427   CL 107 07/19/2013 0427   CO2 22 07/19/2013 0427   GLUCOSE 226* 07/19/2013 0427   BUN 21 07/19/2013 0427   CREATININE 1.54* 07/19/2013 0427   CALCIUM 8.7 07/19/2013 0427   PROT 8.3 07/14/2013 0904   ALBUMIN 3.5 07/14/2013 0904   AST 14 07/14/2013 0904   ALT 17 07/14/2013 0904   ALKPHOS 155* 07/14/2013 0904   BILITOT 0.6 07/14/2013 0904   GFRNONAA 39* 07/19/2013 0427   GFRAA 45* 07/19/2013 0427    Chest Xray Reviewed/Impressions:  Suspect worsening left lower lobe infiltrate, suspicious for  pneumonia. Continued chest radiographic followup recommended     Time In Time Out Total Time Spent with Patient Total Overall Time  1100 am 1146 am 20 min 46 min    Greater than 50%  of this time was spent counseling and coordinating care related to the above assessment and plan.  [name], MD MBA The Palliative Medicine Team at [facility] Phone: [phone] Pager: [phone]

## 2013-07-20 NOTE — Progress Notes (Signed)
Patient ID: Danny Gilbert  male  WJX:[id number]    DOB: 1924/01/25    DOA: 07/14/2013  PCP: Bufford Spikes, DO  Subjective: Waiting on palliative medicine team recommendation. Looks much better and more conversational this AM  Assessment/Plan:  Dehydration with severe hypernatremia:  - Due to poor oral intake, Lasix and newly diagnosed diabetes mellitus  -Was on D5W, has received more than 3 L of normal saline, continue BMET every 8 hours - Sodium within normal limits, as continue D5W, expect better glycemic control after discontinuation of D5W infusion. -Sodium back to normal, was about to start an IV fluids but hold now because of family meeting with hospice.  Fever/pneumonia -Of undetermined origin, as mentioned above chest x-ray, urinalysis and blood cultures. -Patient started empirically on Rocephin and azithromycin. Questionable aspiration. -Chest x-ray showed evidence of pneumonia, likely patient aspirated as he was restarted on diet. -I. will not change antibiotics, waiting on family meeting with hospice and palliative medicine team.  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 - Continue Plavix - PT/OT consult  Dysphagia -This is late effects of previous CVA - SLP consult recommended dysphagia 2 with a thin liquids  Seizures - Continue Tegretol  Dementia - Continue Celexa, Tegretol, BuSpar, Aricept  Acute encephalopathy - Significantly improving, likely worsened due to acute hypernatremia/dehydration   DVT Prophylaxis:  Code Status:  Family Communication:Discussed in detail with patient's wife, son and another family member at the bedside  Disposition: SNF when  ready     Objective: Weight change:   Intake/Output Summary (Last 24 hours) at 07/20/13 1053 Last data filed at 07/20/13 1000  Gross per 24 hour  Intake    240 ml  Output      2 ml  Net    238 ml   Blood pressure 117/53, pulse 81, temperature 98.2 F (36.8 C), temperature source Axillary, resp. rate 16, height 5\' 8"  (1.727 m), weight 71.396 kg (157 lb 6.4 oz), SpO2 97.00%.  Physical Exam: General: Alert and awake, oriented , not in any acute distress., Dry mucosal membranes  CVS: S1-S2 clear, no murmur rubs or gallops Chest: clear to auscultation bilaterally, no wheezing, rales or rhonchi Abdomen: soft nontender, nondistended, normal bowel sounds  Extremities: no cyanosis, clubbing or edema noted bilaterally Neuro:  right-sided hemiplegia and dysarthria  Lab Results: Basic Metabolic Panel:  Recent Labs Lab 07/18/13 1310 07/19/13 0427  NA 145 143  K 5.9* 4.8  CL 108 107  CO2 22 22  GLUCOSE 213* 226*  BUN 11 21  CREATININE 1.08 1.54*  CALCIUM 9.4 8.7   Liver Function Tests:  Recent Labs Lab 07/14/13 0904  AST 14  ALT 17  ALKPHOS 155*  BILITOT 0.6  PROT 8.3  ALBUMIN 3.5   No results found for this basename: LIPASE, AMYLASE,  in the last 168 hours No results found for this basename: AMMONIA,  in the last 168 hours CBC:  Recent Labs Lab 07/14/13 1502 07/19/13 0427  WBC 8.5 8.5  HGB 13.6 9.4*  HCT 42.3 27.8*  MCV 106.3* 97.2  PLT 130* 202   Cardiac Enzymes: No results found for this basename: CKTOTAL, CKMB, CKMBINDEX, TROPONINI,  in the last 168 hours BNP: No components found with this basename: POCBNP,  CBG:  Recent Labs Lab 07/19/13 1654 07/19/13 2000 07/19/13 2353 07/20/13 0410 07/20/13 0746  GLUCAP 116* 178* 118* 135* 105*     Micro Results: Recent Results (from the past 240 hour(s))  MRSA PCR SCREENING     Status: None   Collection Time    07/16/13  8:06 AM      Result Value Ref Range Status   MRSA by PCR NEGATIVE  NEGATIVE  Final   Comment:            The GeneXpert MRSA Assay (FDA     approved for NASAL specimens     only), is one component of a     comprehensive MRSA colonization     surveillance program. It is not     intended to diagnose MRSA     infection nor to guide or     monitor treatment for     MRSA infections.  CULTURE, BLOOD (ROUTINE X 2)     Status: None   Collection Time    07/18/13  4:00 PM      Result Value Ref Range Status   Specimen Description BLOOD RIGHT ARM   Final   Special Requests BOTTLES DRAWN AEROBIC ONLY 10CC   Final   Culture  Setup Time     Final   Value: 07/18/2013 20:03     Performed at [facility]   Culture     Final   Value:        BLOOD CULTURE RECEIVED NO GROWTH TO DATE CULTURE WILL BE HELD FOR 5 DAYS BEFORE ISSUING A FINAL NEGATIVE REPORT     Performed at [facility]   Report Status PENDING   Incomplete  CULTURE, BLOOD (ROUTINE X 2)     Status: None   Collection Time    07/18/13  4:25 PM      Result Value Ref Range Status   Specimen Description BLOOD LEFT ARM   Final   Special Requests BOTTLES DRAWN AEROBIC ONLY 10CC   Final   Culture  Setup Time     Final   Value: 07/18/2013 20:02     Performed at [facility]   Culture     Final   Value:        BLOOD CULTURE RECEIVED NO GROWTH TO DATE CULTURE WILL BE HELD FOR 5 DAYS BEFORE ISSUING A FINAL NEGATIVE REPORT     Performed at [facility]   Report Status PENDING   Incomplete    Studies/Results: Ct Head Wo Contrast  07/14/2013   CLINICAL DATA:  Altered mental status.  EXAM: CT HEAD WITHOUT CONTRAST  TECHNIQUE: Contiguous axial images were obtained from the base of the skull through the vertex without intravenous contrast.  COMPARISON:  CT scan dated 03/12/2010 and MRI of the brain dated 06/02/2010  FINDINGS: No mass lesion. No midline shift. No acute hemorrhage or hematoma. No extra-axial fluid collections. No evidence of acute infarction. There are numerous old cerebellar and  cerebral infarcts with secondary encephalomalacia. There is diffuse cerebral cortical atrophy with slight increased dilatation of the ventricles since the prior exam.  No osseous abnormality.  IMPRESSION: 1. No acute intracranial abnormality. 2. Numerous old infarcts. 3. Progressive diffuse atrophy.   Electronically Signed   By: Geanie CooleyJim  Maxwell M.D.   On: 07/14/2013 10:00   Dg Chest Portable 1 View  07/14/2013   CLINICAL DATA:  Acute mental status changes. Prior stroke with hemiplegia.  EXAM: PORTABLE CHEST - 1  VIEW  COMPARISON:  CT CHEST W/CM dated 03/13/2010; DG CHEST 2 VIEW dated 03/13/2010; DG CHEST 2 VIEW dated 12/14/2006; DG CHEST 1V PORT dated 10/10/2006  FINDINGS: Suboptimal inspiration accounts for crowded bronchovascular markings diffusely and atelectasis in the bases, and accentuates the cardiac silhouette. Taking this into account, cardiac silhouette normal in size, unchanged. Thoracic aorta mildly tortuous and atherosclerotic, unchanged. Hilar and mediastinal contours otherwise unremarkable. Lungs otherwise clear. No localized airspace consolidation. No pleural effusions. No pneumothorax. Normal pulmonary vascularity.  IMPRESSION: Suboptimal inspiration accounts for bibasilar atelectasis. No acute cardiopulmonary disease otherwise.   Electronically Signed   By: Hulan Saas M.D.   On: 07/14/2013 09:36    Medications: Scheduled Meds: . antiseptic oral rinse  15 mL Mouth Rinse q12n4p  . azithromycin  500 mg Intravenous Q24H  . baclofen  10 mg Oral BID  . busPIRone  10 mg Oral BID  . carbamazepine  300 mg Oral BID  . carbamazepine  200 mg Oral Q24H  . cefTRIAXone (ROCEPHIN)  IV  1 g Intravenous Q24H  . chlorhexidine  15 mL Mouth Rinse BID  . citalopram  20 mg Oral Daily  . clopidogrel  75 mg Oral Daily  . donepezil  10 mg Oral QHS  . enoxaparin (LOVENOX) injection  40 mg Subcutaneous Q24H  . free water  250 mL Oral QID  . insulin aspart  0-9 Units Subcutaneous 6 times per day  .  insulin glargine  10 Units Subcutaneous QHS  . Memantine HCl ER  1 capsule Oral Daily  . polyethylene glycol  17 g Oral Daily  . polyvinyl alcohol  1 drop Both Eyes QID  . sennosides-docusate sodium  1 tablet Oral BID      LOS: 6 days   [facility] A M.D. [facility] 07/20/2013, 10:53 AM Pager: [phone]  If 7PM-7AM, please contact night-coverage [email or web address] Password [email or web address]

## 2013-07-21 ENCOUNTER — Encounter (HOSPITAL_COMMUNITY): Payer: Self-pay

## 2013-07-21 DIAGNOSIS — E43 Unspecified severe protein-calorie malnutrition: Secondary | ICD-10-CM | POA: Insufficient documentation

## 2013-07-21 DIAGNOSIS — F0280 Dementia in other diseases classified elsewhere without behavioral disturbance: Secondary | ICD-10-CM

## 2013-07-21 DIAGNOSIS — F0150 Vascular dementia without behavioral disturbance: Secondary | ICD-10-CM

## 2013-07-21 DIAGNOSIS — G309 Alzheimer's disease, unspecified: Secondary | ICD-10-CM

## 2013-07-21 LAB — GLUCOSE, CAPILLARY
Glucose-Capillary: 114 mg/dL — ABNORMAL HIGH (ref 70–99)
Glucose-Capillary: 119 mg/dL — ABNORMAL HIGH (ref 70–99)
Glucose-Capillary: 179 mg/dL — ABNORMAL HIGH (ref 70–99)
Glucose-Capillary: 190 mg/dL — ABNORMAL HIGH (ref 70–99)

## 2013-07-21 MED ORDER — AMOXICILLIN-POT CLAVULANATE 875-125 MG PO TABS: 1.0000 | ORAL_TABLET | Freq: Two times a day (BID) | ORAL | Status: DC

## 2013-07-21 NOTE — Progress Notes (Signed)
CSW (Clinical Social Worker) prepared pt dc packet and placed with shadow chart. CSW arranged non-emergent ambulance transport. Pt family, pt nurse, and facility informed. CSW signing off.  Davieon Stockham, LCSWA [phone]  

## 2013-07-21 NOTE — Progress Notes (Signed)
INITIAL NUTRITION ASSESSMENT  DOCUMENTATION CODES Per approved criteria  -Severe malnutrition in the context of chronic illness   INTERVENTION: Glucerna Shake PO BID, each supplement provides 220 kcal and 10 grams of protein  NUTRITION DIAGNOSIS: Inadequate oral intake related to poor appetite and AMS as evidenced by 25-50% meal completion.   Goal: Intake to meet >90% of estimated nutrition needs.  Monitor:  PO intake, labs, weight trend, overall goals of care.  Reason for Assessment: Low [place]  80 y.o. male  Admitting Dx: Dehydration with hypernatremia  ASSESSMENT: Patient is a 80 y.o. male with PMH of HTN, HPL, h/o CVA with R sided paralysis, seizures, recent Dx with DM, dementia presented with progressive encephalopathy, lethargy, poor oral intake, dehydration and found to have hypernatremia. Per his wife on admission, patient has dementia, CVA and NH resident. He was refusing to take PO intake for a few weeks, progressively getting worse, weak, lethargic.  Palliative Care Team is following patient. S/P meeting with family on 2/22. Plans to continue comfort feeding with dysphagia diet to reduce risk of aspiration, but no plans for feeding tube placement at this time (noted plans may change).   Per discussion with nurse tech, patient has been eating very poorly. He coughs with water sometimes, usually on the second sip. He holds foods in his cheeks.  Nutrition Focused Physical Exam:  Subcutaneous Fat:  Orbital Region: mild depletion Upper Arm Region: NA Thoracic and Lumbar Region: NA  Muscle:  Temple Region: moderate-severe depletion Clavicle Bone Region: severe depletion Clavicle and Acromion Bone Region: moderate-severe depletion Scapular Bone Region: NA Dorsal Hand: NA Patellar Region: WNL Anterior Thigh Region: mild depletion Posterior Calf Region: WNL  Edema: none  Pt meets criteria for severe MALNUTRITION in the context of chronic illness as evidenced by 8%  weight loss in 3 months and severe depletion of muscle mass.  Height: Ht Readings from Last 1 Encounters:  07/14/13 5\' 8"  (1.727 m)    Weight: Wt Readings from Last 1 Encounters:  07/19/13 157 lb 6.4 oz (71.396 kg)    Ideal Body Weight: 70 kg  % Ideal Body Weight: 102%  Wt Readings from Last 10 Encounters:  07/19/13 157 lb 6.4 oz (71.396 kg)  07/11/13 174 lb (78.926 kg)  06/06/13 170 lb (77.111 kg)  05/12/13 168 lb (76.204 kg)  03/12/13 172 lb (78.019 kg)  01/10/13 170 lb (77.111 kg)  11/14/12 173 lb (78.472 kg)  08/28/12 172 lb (78.019 kg)    Usual Body Weight: 170 lb  % Usual Body Weight: 92%  BMI:  Body mass index is 23.94 kg/(m^2).  Estimated Nutritional Needs: Kcal: 1650-1750 Protein: 85-100 gm Fluid: 1.7-1.8 L  Skin: no issues  Diet Order: Dysphagia 1 with thin liquids  EDUCATION NEEDS: -Education not appropriate at this time   Intake/Output Summary (Last 24 hours) at 07/21/13 0847 Last data filed at 07/20/13 2300  Gross per 24 hour  Intake    650 ml  Output      0 ml  Net    650 ml    Last BM: 2/23   Labs:   Recent Labs Lab 07/18/13 0410 07/18/13 1310 07/19/13 0427  NA 150* 145 143  K 5.0 5.9* 4.8  CL 112 108 107  CO2 22 22 22   BUN 10 11 21   CREATININE 0.93 1.08 1.54*  CALCIUM 9.2 9.4 8.7  GLUCOSE 172* 213* 226*    CBG (last 3)   Recent Labs  07/21/13 0006 07/21/13 0423 07/21/13 1610  GLUCAP 190* 119* 114*    Scheduled Meds: . antiseptic oral rinse  15 mL Mouth Rinse q12n4p  . azithromycin  500 mg Intravenous Q24H  . baclofen  10 mg Oral BID  . busPIRone  10 mg Oral BID  . carbamazepine  300 mg Oral BID  . carbamazepine  200 mg Oral Q24H  . cefTRIAXone (ROCEPHIN)  IV  1 g Intravenous Q24H  . chlorhexidine  15 mL Mouth Rinse BID  . citalopram  20 mg Oral Daily  . clopidogrel  75 mg Oral Daily  . donepezil  10 mg Oral QHS  . enoxaparin (LOVENOX) injection  40 mg Subcutaneous Q24H  . free water  250 mL Oral QID  .  insulin aspart  0-9 Units Subcutaneous 6 times per day  . insulin glargine  10 Units Subcutaneous QHS  . Memantine HCl ER  1 capsule Oral Daily  . polyethylene glycol  17 g Oral Daily  . polyvinyl alcohol  1 drop Both Eyes QID  . sennosides-docusate sodium  1 tablet Oral BID    Continuous Infusions:   Past Medical History  Diagnosis Date  . Hyperlipidemia   . Depression   . Seizures   . Hypokalemia   . Dementia   . Hemiplegia affecting dominant side, late effect of cerebrovascular disease   . Constipation   . Edema   . Diabetes mellitus without complication     History reviewed. No pertinent past surgical history.   Joaquin CourtsKimberly Harris, RD, LDN, CNSC Pager [phone] After Hours Pager [phone]

## 2013-07-21 NOTE — Progress Notes (Signed)
Patient XB:MWUX:Danny Gilbert      DOB: 06/02/1923      LKG:[id number]   [facility] Progress Note    Subjective:  Danny Gilbert remains awake and alert today.  Still pleasantly confused.  His family desires continued curative treatment with transition back to the nursing facility.  Spouse was going to review the MOST form and get back with me this evening .  If he returns to nursing home, the SW could facilitate MOST form .  Would recommend Palliative Care Services to follow at facility. Danny Gilbert was open to this.    Filed Vitals:   07/21/13 0517  BP: 118/57  Pulse: 74  Temp: 98.9 F (37.2 C)  Resp: 15   Physical exam:  General:  Pleasantly confused, soft spoken clear PERRL, EOMI, MM dry Chest : decreased distant, no rhonchi audible CVS: R,R,R s1,  s2 Abd: soft, not tender, positive bowel sounds Ext: warm, no edema Neuro: pleasantly confused   No labs   Assessment and plan: 80 yr old Black male with dementia at baseline admitted with aspiration pneumonia, altered mental status.  Seemingly improved as electrolytes improved.   1.  DNR established  2.  Aspiration: continue modified diet, and antibiotics, family desires curative treatment  3.  Seizure disorder: continue AED   Total time 15 min  Danny Gilbert L. Ladona Ridgelaylor, MD MBA The Palliative Medicine Team at [facility] Team Phone: [phone] Pager: [phone]

## 2013-07-22 ENCOUNTER — Other Ambulatory Visit: Payer: Self-pay | Admitting: *Deleted

## 2013-07-22 ENCOUNTER — Non-Acute Institutional Stay (SKILLED_NURSING_FACILITY): Payer: PRIVATE HEALTH INSURANCE | Admitting: Internal Medicine

## 2013-07-22 DIAGNOSIS — R5381 Other malaise: Secondary | ICD-10-CM

## 2013-07-22 DIAGNOSIS — R569 Unspecified convulsions: Secondary | ICD-10-CM

## 2013-07-22 DIAGNOSIS — F0150 Vascular dementia without behavioral disturbance: Secondary | ICD-10-CM

## 2013-07-22 DIAGNOSIS — E119 Type 2 diabetes mellitus without complications: Secondary | ICD-10-CM

## 2013-07-22 DIAGNOSIS — R1312 Dysphagia, oropharyngeal phase: Secondary | ICD-10-CM

## 2013-07-22 DIAGNOSIS — G309 Alzheimer's disease, unspecified: Secondary | ICD-10-CM

## 2013-07-22 DIAGNOSIS — F0280 Dementia in other diseases classified elsewhere without behavioral disturbance: Secondary | ICD-10-CM

## 2013-07-22 DIAGNOSIS — I69959 Hemiplegia and hemiparesis following unspecified cerebrovascular disease affecting unspecified side: Secondary | ICD-10-CM

## 2013-07-22 DIAGNOSIS — E43 Unspecified severe protein-calorie malnutrition: Secondary | ICD-10-CM

## 2013-07-22 MED ORDER — TRAMADOL HCL 50 MG PO TABS: ORAL_TABLET | ORAL | Status: DC

## 2013-07-22 NOTE — Telephone Encounter (Signed)
[facility] [place] 

## 2013-07-22 NOTE — Progress Notes (Signed)
Patient ID: Danny Gilbert, male   DOB: [birth year], 80 y.o.   MRN: [id number]  Provider:  Gwenith Spitz. Renato Gails, D.O., C.M.D. Location:  [facility]   PCP: Bufford Spikes, DO  Code Status: full code per wife and HCPOA request   No Known Allergies  Chief Complaint  Patient presents with  . Hospitalization Follow-up    readmission    HPI: 80 y.o. male with h/o vascular dementia, prior stroke with right hemiparesis, seizures, and recent diagnosis of DMII was readmitted here for continued long term care s/p hospitalization for progressive encephalopathy, lethargy, poor oral intake, dehydration.  He had been found to have hypernatremia.  He was refusing to po intake for few weeks, progressively getting worse, weak, and lethargic.  At the hospital he was found to have persistent hypernatremia, pneumonia, and continued uncontrolled DMII.    When seen upon his return, he was resting in bed with his hand on the left side of his head.  He denied headache.  He admitted to weakness and fatigue.  He denied any other pain.  He is not eating well--has no appetite.  When he is fed, he is holding food in his mouth.  He has lost significant weight between 2/6 174 lbs (before hospitalization) and 156 lbs today.  Had moderate sensory-motor based oropharyngeal dysphagia on his MBS at the hospital.  Per staff, his wife was here prior to my arrival requesting he be made full code again.    ROS: Review of Systems  Constitutional: Positive for weight loss and malaise/fatigue. Negative for fever.  Respiratory: Negative for shortness of breath.   Cardiovascular: Negative for chest pain.  Gastrointestinal: Negative for abdominal pain.       Poor po intake; having some difficulty swallowing, holding food in mouth per staff  Musculoskeletal: Negative for back pain, joint pain, myalgias and neck pain.  Skin: Negative for rash.  Neurological: Positive for focal weakness and weakness. Negative for headaches.        Right hemiparesis (old)  Psychiatric/Behavioral: Positive for memory loss.     Past Medical History  Diagnosis Date  . Hyperlipidemia   . Depression   . Seizures   . Hypokalemia   . Dementia   . Hemiplegia affecting dominant side, late effect of cerebrovascular disease   . Constipation   . Edema   . Diabetes mellitus without complication    No past surgical history on file. Social History:   reports that he has never smoked. He has never used smokeless tobacco. He reports that he does not drink alcohol or use illicit drugs.  No family history on file.  Medications: Patient's Medications  New Prescriptions   No medications on file  Previous Medications   ACETAMINOPHEN (TYLENOL) 325 MG TABLET    Take 650 mg by mouth every 4 (four) hours as needed for mild pain or fever.   AMOXICILLIN-CLAVULANATE (AUGMENTIN) 875-125 MG PER TABLET    Take 1 tablet by mouth 2 (two) times daily.   BACLOFEN (LIORESAL) 10 MG TABLET    Take 10 mg by mouth 2 (two) times daily.    BUSPIRONE (BUSPAR) 10 MG TABLET    Take 10 mg by mouth 2 (two) times daily.   CARBAMAZEPINE (CARBATROL) 300 MG 12 HR CAPSULE    Take 300 mg by mouth 2 (two) times daily.   CARBAMAZEPINE (TEGRETOL) 200 MG TABLET    Take 200 mg by mouth daily at 12 noon.    CITALOPRAM (CELEXA) 20  MG TABLET    Take 20 mg by mouth daily.   CLOPIDOGREL (PLAVIX) 75 MG TABLET    Take 75 mg by mouth daily.   DONEPEZIL (ARICEPT) 10 MG TABLET    Take 10 mg by mouth at bedtime.    INSULIN ASPART (NOVOLOG FLEXPEN) 100 UNIT/ML FLEXPEN    Inject 0-5 Units into the skin 3 (three) times daily with meals. If 0-150 give 0 units.  151 + =5 units   INSULIN GLARGINE (LANTUS SOLOSTAR) 100 UNIT/ML SOLOSTAR PEN    Inject 10 Units into the skin at bedtime.   MEMANTINE HCL ER (NAMENDA XR) 28 MG CP24    Take 1 capsule by mouth daily.   MULTIPLE VITAMIN (MULTIVITAMIN) TABLET    Take 1 tablet by mouth daily.   POLYETHYL GLYCOL-PROPYL GLYCOL (SYSTANE ULTRA) 0.4-0.3 %  SOLN    Apply 1 drop to eye 4 (four) times daily. Both eyes   POLYETHYLENE GLYCOL (MIRALAX / GLYCOLAX) PACKET    Take 17 g by mouth daily.   SENNOSIDES-DOCUSATE SODIUM (SENOKOT-S) 8.6-50 MG TABLET    Take 1 tablet by mouth 2 (two) times daily.   TRAMADOL (ULTRAM) 50 MG TABLET    Take one tablet by mouth every 12 hours for 5 days for pain   WATER FOR IRRIGATION, STERILE (FREE WATER) SOLN    Take 250 mLs by mouth 4 (four) times daily - after meals and at bedtime.  Modified Medications   No medications on file  Discontinued Medications   No medications on file     Physical Exam: There were no vitals filed for this visit. Physical Exam  Constitutional: No distress.  Increasingly frail black male  HENT:  Head: Normocephalic and atraumatic.  Right Ear: External ear normal.  Left Ear: External ear normal.  Nose: Nose normal.  Mucous membranes slightly dry  Eyes: Conjunctivae are normal. Pupils are equal, round, and reactive to light.  Neck: Neck supple. No JVD present.  Cardiovascular: Normal rate, regular rhythm, normal heart sounds and intact distal pulses.   Pulmonary/Chest: Effort normal and breath sounds normal. No respiratory distress.  Abdominal: Soft. Bowel sounds are normal. He exhibits no distension and no mass. There is no tenderness.  Musculoskeletal:  Right hemiparesis  Lymphadenopathy:    He has no cervical adenopathy.  Neurological: He is alert.  Skin: Skin is warm and dry.  Psychiatric:  Flat affect     Labs reviewed: Basic Metabolic Panel:  Recent Labs  16/10/96 0410 07/18/13 1310 07/19/13 0427  NA 150* 145 143  K 5.0 5.9* 4.8  CL 112 108 107  CO2 22 22 22   GLUCOSE 172* 213* 226*  BUN 10 11 21   CREATININE 0.93 1.08 1.54*  CALCIUM 9.2 9.4 8.7   Liver Function Tests:  Recent Labs  07/14/13 0904  AST 14  ALT 17  ALKPHOS 155*  BILITOT 0.6  PROT 8.3  ALBUMIN 3.5  CBC:  Recent Labs  07/14/13 0904 07/14/13 1502 07/19/13 0427  WBC 9.1 8.5  8.5  HGB 14.4 13.6 9.4*  HCT 43.9 42.3 27.8*  MCV 104.8* 106.3* 97.2  PLT 153 130* 202  CBG:  Recent Labs  07/21/13 0423 07/21/13 0737 07/21/13 1154  GLUCAP 119* 114* 179*    Imaging and Procedures: Ct Head Wo Contrast  07/14/2013 CLINICAL DATA: Altered mental status. EXAM: CT HEAD WITHOUT CONTRAST TECHNIQUE: Contiguous axial images were obtained from the base of the skull through the vertex without intravenous contrast. COMPARISON: CT scan dated 03/12/2010  and MRI of the brain dated 06/02/2010 FINDINGS: No mass lesion. No midline shift. No acute hemorrhage or hematoma. No extra-axial fluid collections. No evidence of acute infarction. There are numerous old cerebellar and cerebral infarcts with secondary encephalomalacia. There is diffuse cerebral cortical atrophy with slight increased dilatation of the ventricles since the prior exam. No osseous abnormality. IMPRESSION: 1. No acute intracranial abnormality. 2. Numerous old infarcts. 3. Progressive diffuse atrophy. Electronically Signed By: Geanie Cooley M.D. On: 07/14/2013 10:00   Dg Chest Portable 1 View  07/14/2013 CLINICAL DATA: Acute mental status changes. Prior stroke with hemiplegia. EXAM: PORTABLE CHEST - 1 VIEW COMPARISON: CT CHEST W/CM dated 03/13/2010; DG CHEST 2 VIEW dated 03/13/2010; DG CHEST 2 VIEW dated 12/14/2006; DG CHEST 1V PORT dated 10/10/2006 FINDINGS: Suboptimal inspiration accounts for crowded bronchovascular markings diffusely and atelectasis in the bases, and accentuates the cardiac silhouette. Taking this into account, cardiac silhouette normal in size, unchanged. Thoracic aorta mildly tortuous and atherosclerotic, unchanged. Hilar and mediastinal contours otherwise unremarkable. Lungs otherwise clear. No localized airspace consolidation. No pleural effusions. No pneumothorax. Normal pulmonary vascularity. IMPRESSION: Suboptimal inspiration accounts for bibasilar atelectasis. No acute cardiopulmonary disease otherwise.  Electronically Signed By: Hulan Saas M.D. On: 07/14/2013 09:36   Dg Swallowing Func-speech Pathology  07/16/2013 [facility] McCoy, CCC-SLP 07/16/2013 11:03 AM Objective Swallowing Evaluation: Modified Barium Swallowing Study Patient Details Name: DEANDRAE WAJDA MRN: [id number] Date of Birth: 1924/05/18 Today's Date: 07/16/2013 Time: 4098-1191 SLP Time Calculation (min): 15 min Past Medical History: Past Medical History Diagnosis Date . Hyperlipidemia . Depression . Seizures . Hypokalemia . Dementia . Hemiplegia affecting dominant side, late effect of cerebrovascular disease . Constipation . Edema . Diabetes mellitus without complication Past Surgical History: History reviewed. No pertinent past surgical history. HPI: Danny Gilbert is a 80 y.o. male with PMH of HTN, HPL, h/o CVA with R sided paralysis, seizures, recent Dx with DM, dementia presented with progressive encephalopathy, lethargy, poor oral intake, dehydration and found to have hyper Na; per his wife, patient has dementia, CVA and NH resident; He was refusing to take Po intake for few weeks, progressively getting worse, weak, lethargic; no nausea, vomiting, no abdominal pain, no chest pain, no SOB, no fever; no new focal weaknesses. CXR clear. Assessment / Plan / Recommendation Clinical Impression Dysphagia Diagnosis: Mild oral phase dysphagia;Mild pharyngeal phase dysphagia;Moderate pharyngeal phase dysphagia Clinical impression: Patients presents with a moderate sensory-motor based oropharyngeal dysphagia. Oral phase marked by oral delays and residue due to combination of weakness and AMS. Pharyngeally, patient with a delay in swallow initiation resulting in flash penetration of thin liquids in 1 out of 10 trials and mild-moderate pharyngeal residuals post swallow. Patient without aspiration episodes although residuals do increase risk fas patient required max clinician cueing (verbal, visual) for use of dry swallow in attempts to clear. Recommend  initiation of a dysphagia 2 diet with thin liquids with strict aspiration precautions. SLP will f/u for tolerance. Treatment Recommendation Therapy as outlined in treatment plan below Diet Recommendation Dysphagia 2 (Fine chop);Thin liquid Liquid Administration via: Cup;Straw Medication Administration: Crushed with puree Supervision: Patient able to self feed;Full supervision/cueing for compensatory strategies;Staff to assist with self feeding Compensations: Slow rate;Small sips/bites Postural Changes and/or Swallow Maneuvers: Seated upright 90 degrees Other Recommendations Oral Care Recommendations: Oral care BID Follow Up Recommendations Skilled Nursing facility Frequency and Duration min 2x/week 2 weeks General HPI: Danny Gilbert is a 80 y.o. male with PMH of HTN, HPL, h/o CVA with R sided paralysis,  seizures, recent Dx with DM, dementia presented with progressive encephalopathy, lethargy, poor oral intake, dehydration and found to have hyper Na; per his wife, patient has dementia, CVA and NH resident; He was refusing to take Po intake for few weeks, progressively getting worse, weak, lethargic; no nausea, vomiting, no abdominal pain, no chest pain, no SOB, no fever; no new focal weaknesses. CXR clear. Type of Study: Modified Barium Swallowing Study Reason for Referral: Objectively evaluate swallowing function Previous Swallow Assessment: noen in chart Diet Prior to this Study: NPO Temperature Spikes Noted: No Respiratory Status: Nasal cannula History of Recent Intubation: No Behavior/Cognition: Alert;Confused Oral Cavity - Dentition: Dentures, top (missing bottom dentition) Oral Motor / Sensory Function: Impaired motor (reduced right) Oral impairment: Right lingual;Right labial Self-Feeding Abilities: Able to feed self;Needs assist Patient Positioning: Upright in chair Baseline Vocal Quality: Low vocal intensity Volitional Cough: Strong Volitional Swallow: Able to elicit Anatomy: Other (Comment) (? ostephytes  at C5-7 (MD not present to confirm)) Pharyngeal Secretions: Not observed secondary MBS Reason for Referral Objectively evaluate swallowing function Oral Phase Oral Preparation/Oral Phase Oral Phase: Impaired Oral - Honey Oral - Honey Teaspoon: Reduced posterior propulsion;Lingual/palatal residue;Delayed oral transit Oral - Nectar Oral - Nectar Teaspoon: Reduced posterior propulsion;Lingual/palatal residue;Delayed oral transit Oral - Nectar Cup: Reduced posterior propulsion;Lingual/palatal residue;Delayed oral transit Oral - Thin Oral - Thin Cup: Reduced posterior propulsion;Lingual/palatal residue;Delayed oral transit;Right anterior bolus loss Oral - Thin Straw: Reduced posterior propulsion;Lingual/palatal residue;Delayed oral transit Oral - Solids Oral - Puree: Reduced posterior propulsion;Lingual/palatal residue;Delayed oral transit;Lingual pumping Oral - Mechanical Soft: Reduced posterior propulsion;Lingual/palatal residue;Delayed oral transit;Lingual pumping;Incomplete tongue to palate contact (palatal residuals) Oral Phase - Comment Oral Phase - Comment: likely impacted by both oral weakness and AMS Pharyngeal Phase Pharyngeal Phase Pharyngeal Phase: Impaired Pharyngeal - Honey Pharyngeal - Honey Teaspoon: Delayed swallow initiation;Premature spillage to valleculae;Pharyngeal residue - valleculae;Pharyngeal residue - pyriform sinuses;Reduced tongue base retraction;Reduced laryngeal elevation;Reduced airway/laryngeal closure Pharyngeal - Nectar Pharyngeal - Nectar Teaspoon: Delayed swallow initiation;Premature spillage to valleculae;Pharyngeal residue - valleculae;Pharyngeal residue - pyriform sinuses;Reduced tongue base retraction;Reduced laryngeal elevation;Reduced airway/laryngeal closure Pharyngeal - Nectar Cup: Delayed swallow initiation;Premature spillage to valleculae;Pharyngeal residue - valleculae;Pharyngeal residue - pyriform sinuses;Reduced tongue base retraction;Reduced laryngeal elevation;Reduced  airway/laryngeal closure Pharyngeal - Thin Pharyngeal - Thin Cup: Delayed swallow initiation;Premature spillage to valleculae;Pharyngeal residue - valleculae;Pharyngeal residue - pyriform sinuses;Reduced tongue base retraction;Reduced laryngeal elevation;Reduced airway/laryngeal closure;Penetration/Aspiration before swallow Penetration/Aspiration details (thin cup): Material enters airway, remains ABOVE vocal cords then ejected out (flash penetration) Pharyngeal - Thin Straw: Delayed swallow initiation;Premature spillage to valleculae;Pharyngeal residue - valleculae;Pharyngeal residue - pyriform sinuses;Reduced tongue base retraction;Reduced laryngeal elevation;Reduced airway/laryngeal closure Pharyngeal - Solids Pharyngeal - Puree: Delayed swallow initiation;Premature spillage to valleculae;Pharyngeal residue - valleculae;Pharyngeal residue - pyriform sinuses;Reduced tongue base retraction;Reduced laryngeal elevation;Reduced airway/laryngeal closure Pharyngeal - Mechanical Soft: Delayed swallow initiation;Premature spillage to valleculae;Pharyngeal residue - valleculae;Pharyngeal residue - pyriform sinuses;Reduced tongue base retraction;Reduced laryngeal elevation;Reduced airway/laryngeal closure Cervical Esophageal Phase GO Cervical Esophageal Phase Cervical Esophageal Phase: Impaired Cervical Esophageal Phase - Comment Cervical Esophageal Comment: (? CP bar) Leah McCoy MA, CCC-SLP [phone] McCoy Leah Meryl 07/16/2013, 11:03 AM   Assessment/Plan 1. Dysphagia, oropharyngeal phase -was moderate during assessment at the hospital -continues with pocketing of food or not eating at all -continues to decline -aspiration precautions--requiring feeding at this time -is completing abx with augmentin for aspiration pneumonia on 2/28  2. Mixed Alzheimer's and vascular dementia -end stage -would benefit from hospice care, but it seems his wife has not accepted  this at this point -will consult palliative care  to assist with this discussion--hospital notes indicate that he was meant to transition to hospice care -I would favor stopping aricept and namenda also, but will need to have care plan with his wife   3. Protein-calorie malnutrition, severe -will check prealbumin due to weight loss and decline -cont protein supplements and assisted feeding   4. Hemiplegia affecting dominant side, late effect of cerebrovascular disease -right sided, longstanding  5. Seizures -cont current meds with tegretol  6. Debility -prior to his diabetes diagnosis and progressive decline, he had been mobile in his wheelchair in the halls--now dependent in all adls including feeding, eating poorly  7.  DMII--recently diagnosed--cont lantus 10 units at bedtime with 5 units novolog for cbgs >150 if eats meals  Functional status:  Dependent in all ADLs now  Family/ staff Communication: seen with unit supervisor  Labs/tests ordered: palliative care consult, cbc, bmp, prealbumin

## 2013-07-24 ENCOUNTER — Encounter: Payer: Self-pay | Admitting: Internal Medicine

## 2013-07-24 DIAGNOSIS — R5381 Other malaise: Secondary | ICD-10-CM | POA: Insufficient documentation

## 2013-07-24 DIAGNOSIS — E119 Type 2 diabetes mellitus without complications: Secondary | ICD-10-CM | POA: Insufficient documentation

## 2013-07-24 DIAGNOSIS — R1312 Dysphagia, oropharyngeal phase: Secondary | ICD-10-CM | POA: Insufficient documentation

## 2013-07-24 LAB — CULTURE, BLOOD (ROUTINE X 2)
CULTURE: NO GROWTH
Culture: NO GROWTH

## 2013-07-29 ENCOUNTER — Non-Acute Institutional Stay (SKILLED_NURSING_FACILITY): Payer: PRIVATE HEALTH INSURANCE | Admitting: Internal Medicine

## 2013-07-29 ENCOUNTER — Encounter: Payer: Self-pay | Admitting: Internal Medicine

## 2013-07-29 DIAGNOSIS — R1312 Dysphagia, oropharyngeal phase: Secondary | ICD-10-CM

## 2013-07-29 DIAGNOSIS — E43 Unspecified severe protein-calorie malnutrition: Secondary | ICD-10-CM

## 2013-07-29 DIAGNOSIS — F0280 Dementia in other diseases classified elsewhere without behavioral disturbance: Secondary | ICD-10-CM

## 2013-07-29 DIAGNOSIS — G309 Alzheimer's disease, unspecified: Principal | ICD-10-CM

## 2013-07-29 DIAGNOSIS — F0150 Vascular dementia without behavioral disturbance: Secondary | ICD-10-CM

## 2013-07-29 DIAGNOSIS — M25559 Pain in unspecified hip: Secondary | ICD-10-CM

## 2013-07-29 DIAGNOSIS — M25551 Pain in right hip: Secondary | ICD-10-CM

## 2013-07-29 NOTE — Progress Notes (Signed)
Patient ID: Danny Gilbert, male   DOB: 01/20/1924, 80 y.o.   MRN: [id number]  [facility] Provider:  Elmarie Shileyiffany L. Renato Gailseed, D.O., C.M.D.  Code Status:  Full code   Chief Complaint  Patient presents with  . Acute Visit    continued pain despite bid scheduled tramadol, negative xrays of right hip, continues to refuse to eat    HPI:  80 yo male with h/o dementia with failure to thrive, recent hospitalization and new diabetes diagnosis seen for acute visit due to continued decline.  He continues to eat poorly and his pain is not controlled in his right hip despite scheduled tramadol.    When seen, he was up in his wheelchair though still more lethargic than his baseline and denied pain.    Review of Systems:  Review of Systems  Constitutional: Positive for weight loss and malaise/fatigue.  HENT: Negative for congestion.   Respiratory: Negative for shortness of breath.   Cardiovascular: Negative for chest pain.  Gastrointestinal: Negative for abdominal pain.       No appetite  Musculoskeletal: Negative for joint pain.  Neurological: Positive for weakness.  Psychiatric/Behavioral: Positive for memory loss.    Medications: Patient's Medications  New Prescriptions   No medications on file  Previous Medications   ACETAMINOPHEN (TYLENOL) 325 MG TABLET    Take 650 mg by mouth every 4 (four) hours as needed for mild pain or fever.   AMOXICILLIN-CLAVULANATE (AUGMENTIN) 875-125 MG PER TABLET    Take 1 tablet by mouth 2 (two) times daily.   BACLOFEN (LIORESAL) 10 MG TABLET    Take 10 mg by mouth 2 (two) times daily.    BUSPIRONE (BUSPAR) 10 MG TABLET    Take 10 mg by mouth 2 (two) times daily.   CARBAMAZEPINE (CARBATROL) 300 MG 12 HR CAPSULE    Take 300 mg by mouth 2 (two) times daily.   CARBAMAZEPINE (TEGRETOL) 200 MG TABLET    Take 200 mg by mouth daily at 12 noon.    CITALOPRAM (CELEXA) 20 MG TABLET    Take 20 mg by mouth daily.   CLOPIDOGREL (PLAVIX) 75 MG TABLET    Take 75  mg by mouth daily.   DONEPEZIL (ARICEPT) 10 MG TABLET    Take 10 mg by mouth at bedtime.    INSULIN ASPART (NOVOLOG FLEXPEN) 100 UNIT/ML FLEXPEN    Inject 0-5 Units into the skin 3 (three) times daily with meals. If 0-150 give 0 units.  151 + =5 units   INSULIN GLARGINE (LANTUS SOLOSTAR) 100 UNIT/ML SOLOSTAR PEN    Inject 10 Units into the skin at bedtime.   MEMANTINE HCL ER (NAMENDA XR) 28 MG CP24    Take 1 capsule by mouth daily.   MULTIPLE VITAMIN (MULTIVITAMIN) TABLET    Take 1 tablet by mouth daily.   POLYETHYL GLYCOL-PROPYL GLYCOL (SYSTANE ULTRA) 0.4-0.3 % SOLN    Apply 1 drop to eye 4 (four) times daily. Both eyes   POLYETHYLENE GLYCOL (MIRALAX / GLYCOLAX) PACKET    Take 17 g by mouth daily.   SENNOSIDES-DOCUSATE SODIUM (SENOKOT-S) 8.6-50 MG TABLET    Take 1 tablet by mouth 2 (two) times daily.   TRAMADOL (ULTRAM) 50 MG TABLET    Take one tablet by mouth every 12 hours for 5 days for pain   WATER FOR IRRIGATION, STERILE (FREE WATER) SOLN    Take 250 mLs by mouth 4 (four) times daily - after meals and at bedtime.  Modified  Medications   No medications on file  Discontinued Medications   No medications on file    Physical Exam: Filed Vitals:   07/29/13 0959  BP: 126/63  Pulse: 79  Temp: 98.9 F (37.2 C)  Resp: 18  Height: 6' (1.829 m)  Weight: 154 lb (69.854 kg)  SpO2: 95%  Physical Exam  Constitutional:  Frail black male, seated up in wheelchair, but lethargic  HENT:  Head: Normocephalic and atraumatic.  Cardiovascular: Normal rate, regular rhythm and normal heart sounds.   Pulmonary/Chest: Effort normal and breath sounds normal. He has no wheezes. He has no rales.  Abdominal: Soft. Bowel sounds are normal. He exhibits no distension and no mass. There is no tenderness.  Musculoskeletal: He exhibits no edema and no tenderness.  Neurological:  Awake, but drowsy, answers questions today appropriately  Skin: Skin is warm and dry.  Psychiatric:  Flat affect      Labs  reviewed: Basic Metabolic Panel:  Recent Labs  16/10/96 0410 07/18/13 1310 07/19/13 0427  NA 150* 145 143  K 5.0 5.9* 4.8  CL 112 108 107  CO2 22 22 22   GLUCOSE 172* 213* 226*  BUN 10 11 21   CREATININE 0.93 1.08 1.54*  CALCIUM 9.2 9.4 8.7    Liver Function Tests:  Recent Labs  07/14/13 0904  AST 14  ALT 17  ALKPHOS 155*  BILITOT 0.6  PROT 8.3  ALBUMIN 3.5    CBC:  Recent Labs  07/14/13 0904 07/14/13 1502 07/19/13 0427  WBC 9.1 8.5 8.5  HGB 14.4 13.6 9.4*  HCT 43.9 42.3 27.8*  MCV 104.8* 106.3* 97.2  PLT 153 130* 202   Assessment/Plan 1. Mixed Alzheimer's and vascular dementia -seems to be reaching end stages -not eating well at all, but is a little better today in that he is up out of bed and denying pain--continue to encourage po fluids with help  -care plan pending with his wife who was at work today when we tried to reach her  2. Protein-calorie malnutrition, severe -receiving supplements, but continues to decline--po intake poor, losing weight -I recommend hospice care for him at this point--palliative care order was written -f/u bmp to assess Na and cr  3. Dysphagia, oropharyngeal phase -persists, minimal intake even with help  4. Right hip pain -add back the tramadol for pain   Family/ staff Communication: staff arranging meeting with resident's wife to discuss code status and hospice care and clarify that tube feeding and fluids not recommended due to end of life  Goals of care: remains full code Labs/tests ordered:  Bmp

## 2013-08-01 ENCOUNTER — Non-Acute Institutional Stay (SKILLED_NURSING_FACILITY): Payer: PRIVATE HEALTH INSURANCE | Admitting: Internal Medicine

## 2013-08-01 DIAGNOSIS — F0280 Dementia in other diseases classified elsewhere without behavioral disturbance: Secondary | ICD-10-CM

## 2013-08-01 DIAGNOSIS — F0150 Vascular dementia without behavioral disturbance: Secondary | ICD-10-CM

## 2013-08-01 DIAGNOSIS — R1319 Other dysphagia: Secondary | ICD-10-CM

## 2013-08-01 DIAGNOSIS — G309 Alzheimer's disease, unspecified: Secondary | ICD-10-CM

## 2013-08-01 DIAGNOSIS — R627 Adult failure to thrive: Secondary | ICD-10-CM

## 2013-08-01 NOTE — Progress Notes (Signed)
Patient ID: Danny Gilbert, male   DOB: 06/24/1923, 80 y.o.   MRN: [id number]    [facility] [place]  Chief Complaint  Patient presents with  . Acute Visit    family meeting   No Known Allergies  HPI  80 yo male with history of dementia, CVA and new DM is seen today for his progressive decline. His wife is present at bedside. Patient is mostly in bed and has very poor oral intake. He refuses feed and activities. Reviewed his blood sugar. His pain is somewhat controled this visit. He does not communicate. Unable to obtain ROS from patient  ROS Unable to obtain from patient  Past Medical History  Diagnosis Date  . Hyperlipidemia   . Depression   . Seizures   . Hypokalemia   . Dementia   . Hemiplegia affecting dominant side, late effect of cerebrovascular disease   . Constipation   . Edema   . Diabetes mellitus without complication    [facility]  Physical exam BP 122/64  Pulse 76  Temp(Src) 98.4 F (36.9 C)  Resp 18  Ht 6' (1.829 m)  Wt 154 lb (69.854 kg)  BMI 20.88 kg/m2  SpO2 95%  Constitutional: No distress. frail HENT:   Head: Normocephalic and atraumatic.  Right Ear: External ear normal.  Left Ear: External ear normal.   Nose: Nose normal. Mucous membranes slightly dry  Eyes: Conjunctivae are normal. Pupils are equal, round, and reactive to light.  Neck: Neck supple. No JVD present.  Cardiovascular: Normal rate, regular rhythm, normal heart sounds and intact distal pulses.   Pulmonary/Chest: Effort normal and breath sounds normal. No respiratory distress.  Abdominal: Soft. Bowel sounds are normal. He exhibits no distension and no mass. There is no tenderness.  Musculoskeletal: Right hemiparesis  Neurological: He is alert.  Skin: Skin is warm and dry.  Psychiatric: Flat affect   Assessment/plan  Failure to thrive Progressive decline at present. His progressive dementia, recent hospitalization with deconditioning and poor po intake  could all be contributing to this. Talked with wife in detail regarding his course of illness. There was question regarding peg tube. Explained to her that given his progressive decline, uncontrolled dm, old CVA and severe malnutrition with dementia, he is not a good candidate for PEG tube placement.his wife would then like for us to focus more on keeping him comfortable. She has made him DNR. Will provide palliative care consult   Dysphagia Aspiration precautions, provide assistance with feed for now  Dementia End stage dementia. Wife would like for aricept and namenda to be continued for now. She agrees with palliative care consult. Will have palliative care team see patient. Will need to readdress about disocontinuing some medications next visit. Wife is overwhelmed at present making these decisions  Answered questions from patient's wife.  Labs/tests ordered: palliative care consult  Code status: DNR

## 2013-09-02 ENCOUNTER — Other Ambulatory Visit: Payer: Self-pay | Admitting: *Deleted

## 2013-09-02 MED ORDER — TRAMADOL HCL 50 MG PO TABS: ORAL_TABLET | ORAL | Status: DC

## 2013-09-02 NOTE — Telephone Encounter (Signed)
[facility] [place] 

## 2013-09-03 ENCOUNTER — Other Ambulatory Visit: Payer: Self-pay | Admitting: *Deleted

## 2013-09-03 MED ORDER — TRAMADOL HCL 50 MG PO TABS: ORAL_TABLET | ORAL | Status: DC

## 2013-09-03 NOTE — Telephone Encounter (Signed)
[facility] [place] 

## 2013-09-23 ENCOUNTER — Encounter: Payer: Self-pay | Admitting: Internal Medicine

## 2013-09-23 ENCOUNTER — Non-Acute Institutional Stay (SKILLED_NURSING_FACILITY): Payer: PRIVATE HEALTH INSURANCE | Admitting: Internal Medicine

## 2013-09-23 DIAGNOSIS — F32A Depression, unspecified: Secondary | ICD-10-CM

## 2013-09-23 DIAGNOSIS — E43 Unspecified severe protein-calorie malnutrition: Secondary | ICD-10-CM

## 2013-09-23 DIAGNOSIS — F3289 Other specified depressive episodes: Secondary | ICD-10-CM

## 2013-09-23 DIAGNOSIS — F0280 Dementia in other diseases classified elsewhere without behavioral disturbance: Secondary | ICD-10-CM

## 2013-09-23 DIAGNOSIS — F329 Major depressive disorder, single episode, unspecified: Secondary | ICD-10-CM

## 2013-09-23 DIAGNOSIS — M25551 Pain in right hip: Secondary | ICD-10-CM

## 2013-09-23 DIAGNOSIS — F0150 Vascular dementia without behavioral disturbance: Secondary | ICD-10-CM

## 2013-09-23 DIAGNOSIS — G309 Alzheimer's disease, unspecified: Secondary | ICD-10-CM

## 2013-09-23 DIAGNOSIS — R627 Adult failure to thrive: Secondary | ICD-10-CM

## 2013-09-23 DIAGNOSIS — M25559 Pain in unspecified hip: Secondary | ICD-10-CM

## 2013-09-23 NOTE — Progress Notes (Signed)
Patient ID: Danny Gilbert, male   DOB: 12/14/1923, 80 y.o.   MRN: [id number]  [facility] Provider:  Elmarie Shileyiffany L. Renato Gailseed, D.O., C.M.D.  Code Status:  DNR, palliative care, and hospice referral has been written  Chief Complaint  Patient presents with  . Acute Visit    increased pain and difficulty sleeping    HPI:  80 yo black male with h/o dementia with failure to thrive, protein calorie malnutrition, dysphagia, aspiration, prior stroke with hemiplegia of dominant side, seizures seen for acute visit due to increased pain and difficulty sleeping.  Pain is in his hip area on his stroke side.  Review of Systems:  Review of Systems  Constitutional: Positive for weight loss and malaise/fatigue. Negative for fever and chills.  HENT: Negative for congestion.   Respiratory: Negative for shortness of breath.   Cardiovascular: Negative for chest pain.  Gastrointestinal: Negative for abdominal pain and constipation.  Genitourinary: Negative for dysuria.  Musculoskeletal: Positive for joint pain.  Skin: Negative for rash.  Neurological: Positive for sensory change, focal weakness, seizures and weakness.  Endo/Heme/Allergies: Does not bruise/bleed easily.  Psychiatric/Behavioral: Positive for depression and memory loss. The patient has insomnia.     Medications: Patient's Medications  New Prescriptions   No medications on file  Previous Medications   ACETAMINOPHEN (TYLENOL) 325 MG TABLET    Take 650 mg by mouth every 4 (four) hours as needed for mild pain or fever.   BACLOFEN (LIORESAL) 10 MG TABLET    Take 10 mg by mouth 2 (two) times daily.    BUSPIRONE (BUSPAR) 10 MG TABLET    Take 10 mg by mouth 2 (two) times daily.   CARBAMAZEPINE (TEGRETOL XR) 200 MG 12 HR TABLET    Take 300 mg by mouth 2 (two) times daily.   CARBAMAZEPINE (TEGRETOL) 200 MG TABLET    Take 200 mg by mouth daily.   CITALOPRAM (CELEXA) 20 MG TABLET    Take 20 mg by mouth daily.   CLOPIDOGREL  (PLAVIX) 75 MG TABLET    Take 75 mg by mouth daily.   INSULIN ASPART (NOVOLOG FLEXPEN) 100 UNIT/ML FLEXPEN    Inject 0-5 Units into the skin 3 (three) times daily with meals. If 0-150 give 0 units.  151 + =5 units   INSULIN GLARGINE (LANTUS SOLOSTAR) 100 UNIT/ML SOLOSTAR PEN    Inject 10 Units into the skin at bedtime.   MIRTAZAPINE (REMERON) 7.5 MG TABLET    Take 7.5 mg by mouth at bedtime.   MULTIPLE VITAMIN (MULTIVITAMIN) TABLET    Take 1 tablet by mouth daily.   POLYETHYL GLYCOL-PROPYL GLYCOL (SYSTANE ULTRA) 0.4-0.3 % SOLN    Apply 1 drop to eye 4 (four) times daily. Both eyes   POLYETHYLENE GLYCOL (MIRALAX / GLYCOLAX) PACKET    Take 17 g by mouth daily.   SENNOSIDES-DOCUSATE SODIUM (SENOKOT-S) 8.6-50 MG TABLET    Take 1 tablet by mouth 2 (two) times daily.   SILVER SULFADIAZINE (SILVADENE) 1 % CREAM    Apply 1 application topically 3 (three) times daily. To buttocks x 14 days to prevent pressure ulcer  Modified Medications   Modified Medication Previous Medication   TRAMADOL (ULTRAM) 50 MG TABLET traMADol (ULTRAM) 50 MG tablet      50 mg. Take one tablet by mouth every morning; Take one tablet by mouth at noon; Take two tablets by mouth every night for pain    Take one tablet by mouth every morning; Take one  tablet by mouth at noon; Take two tablets by mouth every night for pain  Discontinued Medications   AMOXICILLIN-CLAVULANATE (AUGMENTIN) 875-125 MG PER TABLET    Take 1 tablet by mouth 2 (two) times daily.   CARBAMAZEPINE (CARBATROL) 300 MG 12 HR CAPSULE    Take 200 mg by mouth daily.    CARBAMAZEPINE (TEGRETOL) 200 MG TABLET    Take 200 mg by mouth daily at 12 noon.    DONEPEZIL (ARICEPT) 10 MG TABLET    Take 10 mg by mouth at bedtime.    MEMANTINE HCL ER (NAMENDA XR) 28 MG CP24    Take 1 capsule by mouth daily.   WATER FOR IRRIGATION, STERILE (FREE WATER) SOLN    Take 250 mLs by mouth 4 (four) times daily - after meals and at bedtime.    Physical Exam: Filed Vitals:   09/23/13 1516    BP: 123/64  Pulse: 90  Temp: 96.8 F (36 C)  Resp: 16  Height: 6' (1.829 m)  Weight: 143 lb (64.864 kg)  SpO2: 95%  Physical Exam  Constitutional: No distress.  Cardiovascular: Normal rate, regular rhythm, normal heart sounds and intact distal pulses.   Pulmonary/Chest: Effort normal and breath sounds normal. No respiratory distress.  Abdominal: Soft. Bowel sounds are normal. He exhibits no distension and no mass. There is no tenderness.  Musculoskeletal: He exhibits tenderness.  Hip and groin area  Neurological: He is alert.  Skin: Skin is warm and dry.  Psychiatric:  Flat affect     Labs reviewed: Basic Metabolic Panel:  Recent Labs  16/02/9601/20/15 0410 07/18/13 1310 07/19/13 0427  NA 150* 145 143  K 5.0 5.9* 4.8  CL 112 108 107  CO2 22 22 22   GLUCOSE 172* 213* 226*  BUN 10 11 21   CREATININE 0.93 1.08 1.54*  CALCIUM 9.2 9.4 8.7    Liver Function Tests:  Recent Labs  07/14/13 0904  AST 14  ALT 17  ALKPHOS 155*  BILITOT 0.6  PROT 8.3  ALBUMIN 3.5    CBC:  Recent Labs  07/14/13 0904 07/14/13 1502 07/19/13 0427  WBC 9.1 8.5 8.5  HGB 14.4 13.6 9.4*  HCT 43.9 42.3 27.8*  MCV 104.8* 106.3* 97.2  PLT 153 130* 202    Assessment/Plan 1. Right hip pain d/c tramadol Start roxanol 20mg /ml 5ml po q 6 hrs prn severe pain  2. Depression increase remeron to 15mg  at bedtime  Intake poor and not helped by dysphagia  3. FTT (failure to thrive) in adult increase remeron to 15mg  at bedtime  Continues to lose weight  4. Mixed Alzheimer's and vascular dementia increase remeron to 15mg  at bedtime  Late stages On namenda XR and aricept for this, may stop aricept upon discussion with his wife due to potential for contribution to his weight loss  5. Protein-calorie malnutrition, severe increase remeron to 15mg  at bedtime   Family/ staff Communication: seen with unit supervisor  Goals of care: DNR, palliative measures, long term care resident

## 2013-09-24 ENCOUNTER — Other Ambulatory Visit: Payer: Self-pay | Admitting: *Deleted

## 2013-09-24 MED ORDER — MORPHINE SULFATE (CONCENTRATE) 20 MG/ML PO SOLN: ORAL | Status: DC

## 2013-09-24 NOTE — Telephone Encounter (Signed)
[facility] [place] 

## 2013-09-25 ENCOUNTER — Other Ambulatory Visit: Payer: Self-pay

## 2013-09-25 MED ORDER — MORPHINE SULFATE (CONCENTRATE) 20 MG/ML PO SOLN: ORAL | Status: DC

## 2013-09-25 NOTE — Telephone Encounter (Signed)
RX faxed to [facility] @ [phone], phone number [phone] 

## 2013-09-26 ENCOUNTER — Other Ambulatory Visit: Payer: Self-pay | Admitting: *Deleted

## 2013-09-26 MED ORDER — MORPHINE SULFATE (CONCENTRATE) 20 MG/ML PO SOLN: ORAL | Status: DC

## 2013-09-26 NOTE — Telephone Encounter (Signed)
[facility]

## 2013-10-24 ENCOUNTER — Non-Acute Institutional Stay (SKILLED_NURSING_FACILITY): Payer: Medicare Other | Admitting: Internal Medicine

## 2013-10-24 DIAGNOSIS — F329 Major depressive disorder, single episode, unspecified: Secondary | ICD-10-CM

## 2013-10-24 DIAGNOSIS — R1312 Dysphagia, oropharyngeal phase: Secondary | ICD-10-CM

## 2013-10-24 DIAGNOSIS — E119 Type 2 diabetes mellitus without complications: Secondary | ICD-10-CM

## 2013-10-24 DIAGNOSIS — F0150 Vascular dementia without behavioral disturbance: Secondary | ICD-10-CM

## 2013-10-24 DIAGNOSIS — G309 Alzheimer's disease, unspecified: Secondary | ICD-10-CM

## 2013-10-24 DIAGNOSIS — F0393 Unspecified dementia, unspecified severity, with mood disturbance: Secondary | ICD-10-CM

## 2013-10-24 DIAGNOSIS — F0280 Dementia in other diseases classified elsewhere without behavioral disturbance: Secondary | ICD-10-CM

## 2013-10-24 DIAGNOSIS — F3289 Other specified depressive episodes: Secondary | ICD-10-CM

## 2013-10-24 NOTE — Progress Notes (Signed)
Patient ID: Danny Gilbert, male   DOB: June 02, 1923, 80 y.o.   MRN: [id number]    [facility] [employer]  Chief Complaint  Patient presents with  . Medical Management of Chronic Issues    routine visit   No Known Allergies  Code: DNR  HPI:  80 y/o patient with h/o vascular dementia, prior stroke with right hemiparesis, seizures, DM is here under hospice care services and seen today for routine visit. No new concern from staff. cbg have been reviewed and are better controlled with am readings mainly 70-80 recently. Post meal readings 92-132 with 1-2 reading of 140 and 150. His po intake has somewhat improved from before. He is in no distress today. He continues to be on puree texture diet with nectar thick liquid.  Review of Systems   Wt Readings from Last 3 Encounters:  10/24/13 151 lb (68.493 kg)  09/23/13 143 lb (64.864 kg)  07/29/13 154 lb (69.854 kg)   Constitutional: weight has been stable now, gained few pounds. Negative for fever.  Respiratory: Negative for shortness of breath.   Cardiovascular: Negative for chest pain.  Gastrointestinal: Negative for abdominal pain. On dysphagia diet Musculoskeletal: Negative for back pain, joint pain, myalgias and neck pain.  Skin: Negative for rash.  Neurological: old Right hemiparesis  Psychiatric/Behavioral: Positive for memory loss  Medication reviewed. See [facility]  Physical exam BP 104/62  Pulse 72  Temp(Src) 98.1 F (36.7 C)  Resp 18  Ht 6' (1.829 m)  Wt 151 lb (68.493 kg)  BMI 20.47 kg/m2  SpO2 95%  Constitutional: frail male in no distress.  HENT:   Head: Normocephalic and atraumatic.  Eyes: Conjunctivae are normal. Pupils are equal, round, and reactive to light.  Neck: Neck supple. No JVD present.  Cardiovascular: Normal rate, regular rhythm, normal heart sounds and intact distal pulses.   Pulmonary/Chest: Effort normal and breath sounds normal. No respiratory distress.  Abdominal: Soft. Bowel sounds are normal. He  exhibits no distension and no mass. There is no tenderness.  Musculoskeletal: Right hemiparesis  Lymphadenopathy:    He has no cervical adenopathy.  Neurological: He is alert.  Skin: Skin is warm and dry.  Psychiatric: Flat affect   Assessment/plan  DM type 2 With well controlled and few lower normal sugar readings mainly in am, will discontinue his novolog for now to avoid risk for hypoglycemia. Continue lantus 10 u daily and monitor cbg  Dysphagia, oropharyngeal phase Continue aspiration precautions, assistance with feeding and pureed diet for now. Has gained few pounds since last visit  Dementia Progressive and decline anticipated. He is under hospice care services and decline anticipated.   Depression with dementia remeron has helped with his mood and weight. Also to continue celexa and buspar  Seizures Remains seizure free. Continue tegretol

## 2013-11-17 DIAGNOSIS — F329 Major depressive disorder, single episode, unspecified: Secondary | ICD-10-CM

## 2013-11-17 DIAGNOSIS — F0393 Unspecified dementia, unspecified severity, with mood disturbance: Secondary | ICD-10-CM | POA: Insufficient documentation

## 2013-11-17 DIAGNOSIS — F0280 Dementia in other diseases classified elsewhere without behavioral disturbance: Secondary | ICD-10-CM | POA: Insufficient documentation

## 2014-01-16 ENCOUNTER — Non-Acute Institutional Stay (SKILLED_NURSING_FACILITY): Payer: PRIVATE HEALTH INSURANCE | Admitting: Internal Medicine

## 2014-01-16 ENCOUNTER — Encounter: Payer: Self-pay | Admitting: Internal Medicine

## 2014-01-16 DIAGNOSIS — L03031 Cellulitis of right toe: Secondary | ICD-10-CM

## 2014-01-16 DIAGNOSIS — R569 Unspecified convulsions: Secondary | ICD-10-CM

## 2014-01-16 DIAGNOSIS — K5900 Constipation, unspecified: Secondary | ICD-10-CM

## 2014-01-16 DIAGNOSIS — L02619 Cutaneous abscess of unspecified foot: Secondary | ICD-10-CM

## 2014-01-16 DIAGNOSIS — F0393 Unspecified dementia, unspecified severity, with mood disturbance: Secondary | ICD-10-CM

## 2014-01-16 DIAGNOSIS — L03039 Cellulitis of unspecified toe: Secondary | ICD-10-CM

## 2014-01-16 DIAGNOSIS — F329 Major depressive disorder, single episode, unspecified: Secondary | ICD-10-CM

## 2014-01-16 DIAGNOSIS — F3289 Other specified depressive episodes: Secondary | ICD-10-CM

## 2014-01-16 DIAGNOSIS — E119 Type 2 diabetes mellitus without complications: Secondary | ICD-10-CM

## 2014-01-16 DIAGNOSIS — I6350 Cerebral infarction due to unspecified occlusion or stenosis of unspecified cerebral artery: Secondary | ICD-10-CM

## 2014-01-16 DIAGNOSIS — F0280 Dementia in other diseases classified elsewhere without behavioral disturbance: Secondary | ICD-10-CM

## 2014-01-16 DIAGNOSIS — I69959 Hemiplegia and hemiparesis following unspecified cerebrovascular disease affecting unspecified side: Secondary | ICD-10-CM

## 2014-01-16 DIAGNOSIS — I639 Cerebral infarction, unspecified: Secondary | ICD-10-CM

## 2014-01-16 NOTE — Progress Notes (Signed)
Patient ID: Danny Gilbert, male   DOB: 10/15/1923, 80 y.o.   MRN: [id number]    Facility: [facility] Chief Complaint  Patient presents with  . Medical Management of Chronic Issues    routine visit   No Known Allergies  HPI:   80 y/o male patient is seen for routine follow up. he is awake and alert. Staff have noticed his right toe nail to have tenderness and wants this assessed. He is lying in his bed and appears in no distress.  No other concerns from staff. No recent seizure reported. No falls reported. No acute behavioral change  Review of Systems   Constitutional: Negative for fever, chills, diaphoresis.   HENT: Negative for congestion.      Respiratory: Negative for shortness of breath.    Cardiovascular: Negative for chest pain.   Gastrointestinal: Negative for nausea, vomiting and abdominal pain.    Musculoskeletal: Negative for falls.   Skin: Negative for rash.    Past Medical History  Diagnosis Date  . Hyperlipidemia   . Depression   . Seizures   . Hypokalemia   . Dementia   . Hemiplegia affecting dominant side, late effect of cerebrovascular disease   . Constipation   . Edema   . Diabetes mellitus without complication    Current Outpatient Prescriptions on File Prior to Visit  Medication Sig Dispense Refill  . acetaminophen (TYLENOL) 325 MG tablet Take 650 mg by mouth every 4 (four) hours as needed for mild pain or fever.      . baclofen (LIORESAL) 10 MG tablet Take 10 mg by mouth 2 (two) times daily.       . carbamazepine (TEGRETOL XR) 200 MG 12 hr tablet Take 300 mg by mouth 2 (two) times daily.      . carbamazepine (TEGRETOL) 200 MG tablet Take 200 mg by mouth daily. 300 mg in am and bedtime and 200 mg at noon      . citalopram (CELEXA) 20 MG tablet Take 20 mg by mouth daily.      . clopidogrel (PLAVIX) 75 MG tablet Take 75 mg by mouth daily.      . Insulin Glargine (LANTUS SOLOSTAR) 100 UNIT/ML Solostar Pen Inject 10 Units into the skin at  bedtime.      . mirtazapine (REMERON) 7.5 MG tablet Take 15 mg by mouth at bedtime.       Marland Kitchen morphine (ROXANOL) 20 MG/ML concentrated solution Take 0.76ml by mouth /under tongue every 4 hours as needed for severe pain  30 mL  0  . Multiple Vitamin (MULTIVITAMIN) tablet Take 1 tablet by mouth daily.      Bertram Gala Glycol-Propyl Glycol (SYSTANE ULTRA) 0.4-0.3 % SOLN Apply 1 drop to eye 4 (four) times daily. Both eyes      . polyethylene glycol (MIRALAX / GLYCOLAX) packet Take 17 g by mouth daily.      . sennosides-docusate sodium (SENOKOT-S) 8.6-50 MG tablet Take 1 tablet by mouth 2 (two) times daily.      . traMADol (ULTRAM) 50 MG tablet 50 mg. Take one tablet by mouth every morning; Take one tablet by mouth at noon; Take two tablets by mouth every night for pain       No current facility-administered medications on file prior to visit.    Physical exam BP 118/79  Pulse 73  Temp(Src) 98.1 F (36.7 C)  Resp 19  Ht 6' (1.829 m)  Wt 166 lb (75.297 kg)  BMI  22.51 kg/m2  SpO2 97%  Constitutional: No distress. Frail elderly male HENT:   Head: Normocephalic and atraumatic.  Nose: Nose normal.  Eyes: Conjunctivae are normal. Pupils are equal, round, and reactive to light.  Neck: Neck supple. No JVD present.  Cardiovascular: Normal rate, regular rhythm, normal heart sounds and intact distal pulses.   Pulmonary/Chest: Effort normal and breath sounds normal. No respiratory distress.  Abdominal: Soft. Bowel sounds are normal. He exhibits no distension and no mass. There is no tenderness.  Musculoskeletal: right hemiparesis. Left foot in podus boot, no wound/ open sore or pressure ulcers Right great toe has skin pulled off the nail bed and yellow drainage mixed with blood draining, erythema and tenderness present, good distal pulses Lymphadenopathy: no cervical adenopathy.  Neurological: He is alert.  Skin: Skin is warm and dry.  Psychiatric: flat affect  Labs- 07/30/13 na 138, k 4.3, glu  131, bun 10, cr 0.97, ca 9.1, wbc 10.8, hb 12.9, hct 36.5, plt 335, na 140, k 4.7, glu 123, bun 11, cr 0.94, ca 8.8, prealbumin 12.1  cbg 73-101 in am ad pm 149-216  Assessment/plan  Right toe cellulitis Doxycycline 100 mg bid x 5 days with florastor 250 mg bid x 2 weeks.Clean area with normal saline, apply antibiotic ointment and gauze dressing. Check Cbc and cmp  Dm type 2 Stable, continue lantus 10 u at bedtime, reviewed cbg. Not on any statin or ACEI. Check urine microalbumin and lipid panel. Off aspirin with hemorrhagic cva . Check a1c  cva  With right sided hemiparesis, continue plavix, bp stable. Continue baclofen and prn morphine for pain and spasticity  Depression Better mood, continue celexa 20 mg daily and remeron. Appetite is better, somewhat interactive  Seizure Remains seizure free. Continue carbatrol and tegretol  Constipation  continue miralax and senna s   Check a1c, cmp, cbc with diff,  urine microalbumin, lipid panel  Code: DNR

## 2014-02-25 ENCOUNTER — Other Ambulatory Visit: Payer: Self-pay | Admitting: Internal Medicine

## 2014-02-25 LAB — LIPID PANEL
CHOL/HDL RATIO: 4 ratio
Cholesterol: 176 mg/dL (ref 0–200)
HDL: 44 mg/dL (ref >39)
LDL CALC: 110 mg/dL — AB (ref 0–99)
TRIGLYCERIDES: 110 mg/dL (ref <150)
VLDL: 22 mg/dL (ref 0–40)

## 2014-03-19 ENCOUNTER — Non-Acute Institutional Stay (SKILLED_NURSING_FACILITY): Payer: PRIVATE HEALTH INSURANCE | Admitting: Internal Medicine

## 2014-03-19 ENCOUNTER — Encounter: Payer: Self-pay | Admitting: Internal Medicine

## 2014-03-19 DIAGNOSIS — H409 Unspecified glaucoma: Secondary | ICD-10-CM

## 2014-03-19 DIAGNOSIS — G309 Alzheimer's disease, unspecified: Secondary | ICD-10-CM

## 2014-03-19 DIAGNOSIS — F0280 Dementia in other diseases classified elsewhere without behavioral disturbance: Secondary | ICD-10-CM

## 2014-03-19 DIAGNOSIS — F0393 Unspecified dementia, unspecified severity, with mood disturbance: Secondary | ICD-10-CM

## 2014-03-19 DIAGNOSIS — E119 Type 2 diabetes mellitus without complications: Secondary | ICD-10-CM

## 2014-03-19 DIAGNOSIS — I69959 Hemiplegia and hemiparesis following unspecified cerebrovascular disease affecting unspecified side: Secondary | ICD-10-CM

## 2014-03-19 DIAGNOSIS — R1312 Dysphagia, oropharyngeal phase: Secondary | ICD-10-CM

## 2014-03-19 DIAGNOSIS — F0150 Vascular dementia without behavioral disturbance: Secondary | ICD-10-CM

## 2014-03-19 DIAGNOSIS — R569 Unspecified convulsions: Secondary | ICD-10-CM

## 2014-03-19 DIAGNOSIS — F329 Major depressive disorder, single episode, unspecified: Secondary | ICD-10-CM

## 2014-03-19 DIAGNOSIS — K5900 Constipation, unspecified: Secondary | ICD-10-CM

## 2014-03-19 DIAGNOSIS — G8190 Hemiplegia, unspecified affecting unspecified side: Secondary | ICD-10-CM

## 2014-03-19 DIAGNOSIS — E785 Hyperlipidemia, unspecified: Secondary | ICD-10-CM

## 2014-03-19 NOTE — Progress Notes (Signed)
Patient ID: Danny Gilbert, male   DOB: 08/12/1923, 80 y.o.   MRN: [id number]    [facility]  Chief Complaint  Patient presents with  . Annual Exam   No Known Allergies  Code: DNR  HPI:   80 y/o male patient is seen today for annual exam. He is not under hospice service at present. He has history of cva, seizure, DM, dysphagia and dementia among others. He has been at his baseline as per staff. No concerns from staff. No recent seizure reported. No falls reported. No acute behavioral change. He feeds himself most of the time with some assistance at times. He needs to be propelled on wheelchair. No skin concerns reported. cbg reviewed- highest 248 one reading, lowest 75, mostly 115-160 bp reviewed 120-145/68-84  Review of Systems   Constitutional: Negative for fever, chills, diaphoresis.   HENT: Negative for congestion.      Respiratory: Negative for shortness of breath.    Cardiovascular: Negative for chest pain.   Gastrointestinal: Negative for nausea, vomiting and abdominal pain.    Musculoskeletal: Negative for falls.   Skin: Negative for rash.    Past Medical History  Diagnosis Date  . Hyperlipidemia   . Depression   . Seizures   . Hypokalemia   . Dementia   . Hemiplegia affecting dominant side, late effect of cerebrovascular disease   . Constipation   . Edema   . Diabetes mellitus without complication      Medication List       This list is accurate as of: 03/19/14  3:25 PM.  Always use your most recent med list.               acetaminophen 325 MG tablet  Commonly known as:  TYLENOL  Take 650 mg by mouth every 4 (four) hours as needed for mild pain or fever.     baclofen 10 MG tablet  Commonly known as:  LIORESAL  Take 10 mg by mouth 2 (two) times daily.     carbamazepine 200 MG tablet  Commonly known as:  TEGRETOL  Take 200 mg by mouth daily. 300 mg in am and bedtime and 200 mg at noon     citalopram 20 MG tablet    Commonly known as:  CELEXA  Take 20 mg by mouth daily.     clopidogrel 75 MG tablet  Commonly known as:  PLAVIX  Take 75 mg by mouth daily.     LANTUS SOLOSTAR 100 UNIT/ML Solostar Pen  Generic drug:  Insulin Glargine  Inject 10 Units into the skin at bedtime.     mirtazapine 7.5 MG tablet  Commonly known as:  REMERON  Take 15 mg by mouth at bedtime.     morphine 20 MG/ML concentrated solution  Commonly known as:  ROXANOL  Take 0.9925ml by mouth /under tongue every 4 hours as needed for severe pain     multivitamin tablet  Take 1 tablet by mouth daily.     polyethylene glycol packet  Commonly known as:  MIRALAX / GLYCOLAX  Take 17 g by mouth daily.     sennosides-docusate sodium 8.6-50 MG tablet  Commonly known as:  SENOKOT-S  Take 1 tablet by mouth 2 (two) times daily.     SYSTANE ULTRA 0.4-0.3 % Soln  Generic drug:  Polyethyl Glycol-Propyl Glycol  Apply 1 drop to eye 4 (four) times daily. Both eyes     traMADol 50 MG tablet  Commonly known  as:  ULTRAM  50 mg. Take one tablet by mouth every morning; Take one tablet by mouth at noon; Take two tablets by mouth every night for pain       Physical exam BP 130/60  Pulse 79  Temp(Src) 97 F (36.1 C)  Resp 18  Ht 6' (1.829 m)  Wt 164 lb (74.39 kg)  BMI 22.24 kg/m2  SpO2 97%  Wt Readings from Last 3 Encounters:  03/19/14 164 lb (74.39 kg)  01/16/14 166 lb (75.297 kg)  10/24/13 151 lb (68.493 kg)   Constitutional: Frail elderly male in no acute distress HENT:   Head: Normocephalic and atraumatic.  Nose: Nose normal.  Eyes: Conjunctivae are normal. Pupils are equal, round, and reactive to light.   Neck: Neck supple. No JVD present.   Cardiovascular: Normal rate, regular rhythm, normal heart sounds and intact distal pulses.    Pulmonary/Chest: Effort normal and breath sounds normal. No respiratory distress.  Abdominal: Soft. Bowel sounds are normal. He exhibits no distension and no mass. There is no tenderness.   Musculoskeletal: right hemiparesis. Normal distal pulses. No edema Lymphadenopathy: no cervical adenopathy.  Neurological: He is alert.   Skin: Skin is warm and dry.  Psychiatric: flat affect  Labs- 07/30/13 na 138, k 4.3, glu 131, bun 10, cr 0.97, ca 9.1, wbc 10.8, hb 12.9, hct 36.5, plt 335, na 140, k 4.7, glu 123, bun 11, cr 0.94, ca 8.8, prealbumin 12.1 01/19/14 wbc 4.4, hb 12.9, hct 36.9, na 139, k 4.6, glu 64, bun 18, cr 1.08, lft wnl, a1c 5.9 02/25/14 t.chol 176, tg 110, hdl 44, ldl 110  Assessment/plan  CVA with residual weakness With right sided hemiparesis. Stable. Continue plavix. Continue baclofen 10 mg bid for muscle spasm. Change roxanol to 5 mg q8h prn pain for now.  Seizure Remains seizure free. Continue carbamazepine, check serum carbamezapine level, thyroid level with him being on this med  Depression Stable on celexa 20 mg daily. Appetite and weight stable. Change remeron to 7.5 mg daily in attempt for GDR  Constipation   continue miralax and senna s  Glaucoma Continue simbrinza eye drops for now  Dm type 2 Stable, continue lantus 10 u at bedtime, reviewed cbg. Off aspirin with hemorrhagic cva . Check a1c and urine microalbumin for now  Hyperlipidemia Given his age, keep ldl < 130, at goal, off all medications, monitor clinically  Dysphagia Continue pureed diet with thickened liquid, aspiration precautions  Dementia Off all medications. Monitor clinically. No new skin concern or behavior concern. Monitor his nutritional intake  Labs- a1c, tsh, carbamezapine level, urine microalbumin

## 2014-03-23 ENCOUNTER — Other Ambulatory Visit: Payer: Self-pay | Admitting: *Deleted

## 2014-03-23 MED ORDER — AMBULATORY NON FORMULARY MEDICATION: Status: DC

## 2014-03-23 NOTE — Telephone Encounter (Signed)
[facility] 

## 2014-05-07 ENCOUNTER — Other Ambulatory Visit: Payer: Self-pay | Admitting: Internal Medicine

## 2014-05-07 ENCOUNTER — Encounter: Payer: Self-pay | Admitting: Internal Medicine

## 2014-05-07 ENCOUNTER — Non-Acute Institutional Stay (SKILLED_NURSING_FACILITY): Payer: PRIVATE HEALTH INSURANCE | Admitting: Internal Medicine

## 2014-05-07 DIAGNOSIS — F0150 Vascular dementia without behavioral disturbance: Secondary | ICD-10-CM

## 2014-05-07 DIAGNOSIS — F0393 Unspecified dementia, unspecified severity, with mood disturbance: Secondary | ICD-10-CM

## 2014-05-07 DIAGNOSIS — G309 Alzheimer's disease, unspecified: Secondary | ICD-10-CM

## 2014-05-07 DIAGNOSIS — F0280 Dementia in other diseases classified elsewhere without behavioral disturbance: Secondary | ICD-10-CM

## 2014-05-07 DIAGNOSIS — R5381 Other malaise: Secondary | ICD-10-CM

## 2014-05-07 DIAGNOSIS — E119 Type 2 diabetes mellitus without complications: Secondary | ICD-10-CM

## 2014-05-07 DIAGNOSIS — I69959 Hemiplegia and hemiparesis following unspecified cerebrovascular disease affecting unspecified side: Secondary | ICD-10-CM

## 2014-05-07 DIAGNOSIS — R569 Unspecified convulsions: Secondary | ICD-10-CM

## 2014-05-07 DIAGNOSIS — H4010X2 Unspecified open-angle glaucoma, moderate stage: Secondary | ICD-10-CM

## 2014-05-07 DIAGNOSIS — M62838 Other muscle spasm: Secondary | ICD-10-CM

## 2014-05-07 DIAGNOSIS — M6249 Contracture of muscle, multiple sites: Secondary | ICD-10-CM

## 2014-05-07 DIAGNOSIS — F329 Major depressive disorder, single episode, unspecified: Secondary | ICD-10-CM

## 2014-05-07 DIAGNOSIS — G8190 Hemiplegia, unspecified affecting unspecified side: Secondary | ICD-10-CM

## 2014-05-07 DIAGNOSIS — I639 Cerebral infarction, unspecified: Secondary | ICD-10-CM

## 2014-05-07 DIAGNOSIS — E785 Hyperlipidemia, unspecified: Secondary | ICD-10-CM

## 2014-05-07 DIAGNOSIS — G4700 Insomnia, unspecified: Secondary | ICD-10-CM

## 2014-05-07 NOTE — Progress Notes (Signed)
   Subjective:    Patient ID: Danny Gilbert, male    DOB: 08/02/1923, 80 y.o.   MRN: [id number]  [facility]  HPI  80 yo male presents for chronic medical management of  Mixed Alzheimer's and vascular dementia, CVA with hemiplegia and dysphagia, seizures, controlled diabetes mellitus, spastic muscles. No seizure activity reported by nursing. No low BS reactions. CBG's 90-110s in AM with occasional 120-140s and rarely > 200s; PM readings 160-250s and rarely >300s.  Review of Systems  Unable to perform ROS  Pt refuses to answer questions    Objective:   Physical Exam  Constitutional: No distress.  Frail appearing  Cardiovascular: Normal rate, regular rhythm, normal heart sounds and intact distal pulses.  Exam reveals no gallop and no friction rub.   No murmur heard. Trace distal lower extremity swelling b/l  Pulmonary/Chest: Breath sounds normal. No respiratory distress. He has no wheezes. He has no rales.  Lungs clear anteriorly  Abdominal: Soft. Bowel sounds are normal. He exhibits no distension and no mass. There is no tenderness. There is no rebound and no guarding.  Skin: Skin is warm and dry. No rash noted. He is not diaphoretic.  Vitals reviewed.   LABS: 03/20/14 TSH 0.867; A1c 6%; Tegretol 9.9  02/25/14 Tchol 176; TG 110; HDL 44; LDL 110  01/19/14 H/H 12.9/36.9; Cr 1.08      Assessment & Plan:     ICD-9-CM ICD-10-CM   1. Type 2 diabetes mellitus without complication 250.00 E11.9   2. Mixed Alzheimer's and vascular dementia 331.0 G30.9    294.10 F01.50    290.40 F02.80   3. CVA (cerebral vascular accident) 434.91 I63.9   4. Debility 799.3 R53.81   5. Seizures 780.39 R56.9 carbamazepine (CARBATROL) 300 MG 12 hr capsule  6. Hemiplegia of dominant side as late effect following cerebrovascular disease 438.21 G81.90   7. Depression due to dementia 311 F32.9    294.10 F02.80   8. Muscle spasticity 728.85 M62.49   9. Hyperlipidemia 272.4 E78.5   10.  Open-angle glaucoma, moderate stage 365.10 H40.10X2 Brinzolamide-Brimonidine 1-0.2 % SUSP   365.72    11. Insomnia 780.52 G47.00 mirtazapine (REMERON) 15 MG tablet   - change insulin administration to 6 AM to improve daytime readings. Continue CBG daily and record. Check HgbA1c and CMP next month  -seizures stable on current regimen of tegretol. Reck level in 1 month  -f/u with eye provider as scheduled to mx glaucoma  - Pain stable on prn morphine  - Continue all other medications as rx  - Encourage OOB

## 2014-05-08 MED ORDER — BRINZOLAMIDE-BRIMONIDINE 1-0.2 % OP SUSP: OPHTHALMIC | Status: AC

## 2014-05-08 MED ORDER — MENTHOL 5.4 MG MT LOZG: LOZENGE | OROMUCOSAL | Status: DC

## 2014-05-08 MED ORDER — MIRTAZAPINE 15 MG PO TABS: 7.5000 mg | ORAL_TABLET | Freq: Every day | ORAL | Status: DC

## 2014-05-08 MED ORDER — CARBAMAZEPINE ER 300 MG PO CP12: 300.0000 mg | ORAL_CAPSULE | Freq: Two times a day (BID) | ORAL | Status: DC

## 2014-06-11 ENCOUNTER — Emergency Department (HOSPITAL_COMMUNITY)
Admission: EM | Admit: 2014-06-11 | Discharge: 2014-06-11 | Disposition: A | Discharge status: Home / Self Care | Payer: Medicaid Other | Attending: Emergency Medicine | Admitting: Emergency Medicine

## 2014-06-11 ENCOUNTER — Encounter (HOSPITAL_COMMUNITY): Payer: Self-pay

## 2014-06-11 DIAGNOSIS — G40909 Epilepsy, unspecified, not intractable, without status epilepticus: Secondary | ICD-10-CM | POA: Insufficient documentation

## 2014-06-11 DIAGNOSIS — Z79899 Other long term (current) drug therapy: Secondary | ICD-10-CM | POA: Insufficient documentation

## 2014-06-11 DIAGNOSIS — F0390 Unspecified dementia without behavioral disturbance: Secondary | ICD-10-CM | POA: Insufficient documentation

## 2014-06-11 DIAGNOSIS — I10 Essential (primary) hypertension: Secondary | ICD-10-CM | POA: Diagnosis not present

## 2014-06-11 DIAGNOSIS — Z8719 Personal history of other diseases of the digestive system: Secondary | ICD-10-CM | POA: Diagnosis not present

## 2014-06-11 DIAGNOSIS — F329 Major depressive disorder, single episode, unspecified: Secondary | ICD-10-CM | POA: Diagnosis not present

## 2014-06-11 DIAGNOSIS — Z8673 Personal history of transient ischemic attack (TIA), and cerebral infarction without residual deficits: Secondary | ICD-10-CM | POA: Insufficient documentation

## 2014-06-11 DIAGNOSIS — E119 Type 2 diabetes mellitus without complications: Secondary | ICD-10-CM | POA: Insufficient documentation

## 2014-06-11 DIAGNOSIS — Z794 Long term (current) use of insulin: Secondary | ICD-10-CM | POA: Insufficient documentation

## 2014-06-11 DIAGNOSIS — R040 Epistaxis: Secondary | ICD-10-CM

## 2014-06-11 DIAGNOSIS — Z7902 Long term (current) use of antithrombotics/antiplatelets: Secondary | ICD-10-CM | POA: Diagnosis not present

## 2014-06-11 HISTORY — DX: Cerebral infarction, unspecified: I63.9

## 2014-06-11 HISTORY — DX: Essential (primary) hypertension: I10

## 2014-06-11 NOTE — Discharge Instructions (Signed)

## 2014-06-11 NOTE — ED Notes (Signed)
MD at bedside. 

## 2014-06-11 NOTE — ED Notes (Signed)
Notified [facility] for transportation back home 

## 2014-06-11 NOTE — ED Notes (Signed)
Per EMS pt has had nose bleed since 3pm; nose bleed was stopped and started up again just before eating dinner; denies hx of nose bleed; [facility]; hx Dementia, DM, CVA with right side deficit, HTN, and seizures

## 2014-06-11 NOTE — ED Provider Notes (Signed)
CSN: [id number]     Arrival date & time 06/11/14  1942 History   First MD Initiated Contact with Patient 06/11/14 1943     Chief Complaint  Patient presents with  . Epistaxis     (Consider location/radiation/quality/duration/timing/severity/associated sxs/prior Treatment) Patient is a 81 y.o. male presenting with nosebleeds. The history is provided by the EMS personnel. No language interpreter was used.  Epistaxis Location:  R nare Severity:  Moderate Timing:  Intermittent Progression:  Resolved Chronicity:  New Context: not anticoagulants, not bleeding disorder, not drug use, not elevation change, not nose picking, not recent infection and not trauma   Context comment:  Plavix use Relieved by:  Applying pressure Worsened by:  Nothing tried Ineffective treatments:  None tried Associated symptoms: no blood in oropharynx, no congestion, no cough, no dizziness, no facial pain, no fever and no sore throat   Risk factors: no alcohol use, no allergies, no change in medication and no frequent nosebleeds     Past Medical History  Diagnosis Date  . Hyperlipidemia   . Depression   . Seizures   . Hypokalemia   . Dementia   . Hemiplegia affecting dominant side, late effect of cerebrovascular disease   . Constipation   . Edema   . Diabetes mellitus without complication   . Stroke   . Hypertension    No past surgical history on file. History reviewed. No pertinent family history. History  Substance Use Topics  . Smoking status: Never Smoker   . Smokeless tobacco: Never Used  . Alcohol Use: No    Review of Systems  Unable to perform ROS: Dementia  Constitutional: Negative for fever.  HENT: Positive for nosebleeds. Negative for congestion and sore throat.   Respiratory: Negative for cough.   Neurological: Negative for dizziness.      Allergies  Review of patient's allergies indicates no known allergies.  Home Medications   Prior to Admission medications   Medication  Sig Start Date End Date Taking? Authorizing Provider  acetaminophen (TYLENOL) 325 MG tablet Take 650 mg by mouth every 4 (four) hours as needed for mild pain or fever.   Yes Historical Provider, MD  baclofen (LIORESAL) 10 MG tablet Take 10 mg by mouth 2 (two) times daily.    Yes Historical Provider, MD  Brinzolamide-Brimonidine 1-0.2 % SUSP 1 gtt in both eyes BID 05/08/14  Yes Monica Guthrie, DO  carbamazepine (CARBATROL) 300 MG 12 hr capsule Take 1 capsule (300 mg total) by mouth 2 (two) times daily. 05/08/14  Yes Monica [employer] [place], DO  carbamazepine (TEGRETOL) 200 MG tablet Take 200 mg by mouth daily.    Yes Historical Provider, MD  citalopram (CELEXA) 20 MG tablet Take 20 mg by mouth daily.   Yes Historical Provider, MD  clopidogrel (PLAVIX) 75 MG tablet Take 75 mg by mouth daily.   Yes Historical Provider, MD  Insulin Glargine (LANTUS SOLOSTAR) 100 UNIT/ML Solostar Pen Inject 10 Units into the skin at bedtime.   Yes Historical Provider, MD  Menthol (CEPACOL SORE THROAT) 5.4 MG LOZG 1 tablet po TID prn sore throat 05/08/14  Yes Monica Shamsid-Deen Carter, DO  mirtazapine (REMERON) 15 MG tablet Take 0.5 tablets (7.5 mg total) by mouth at bedtime. 05/08/14  Yes Monica Cheraw, DO  morphine (ROXANOL) 20 MG/ML concentrated solution Take 0.51ml by mouth /under tongue every 4 hours as needed for severe pain Patient taking differently: Take 5 mg by mouth every 8 (eight) hours as needed for severe pain.  09/26/13  Yes Sharon Seller, NP  Multiple Vitamin (MULTIVITAMIN) tablet Take 1 tablet by mouth daily.   Yes Historical Provider, MD  Polyethyl Glycol-Propyl Glycol (SYSTANE ULTRA) 0.4-0.3 % SOLN Apply 1 drop to eye 4 (four) times daily. Both eyes   Yes Historical Provider, MD  polyethylene glycol (MIRALAX / GLYCOLAX) packet Take 17 g by mouth daily.   Yes Historical Provider, MD  sennosides-docusate sodium (SENOKOT-S) 8.6-50 MG tablet Take 1 tablet by mouth 2 (two) times  daily.   Yes Historical Provider, MD   BP 141/65 mmHg  Pulse 91  Temp(Src) 98.8 F (37.1 C)  Resp 16  SpO2 100% Physical Exam  Constitutional: He is oriented to person, place, and time. He appears well-developed and well-nourished. No distress.  HENT:  Head: Normocephalic and atraumatic.  Nose: No epistaxis.  Clot within the right nare extending into the oropharynx.  No active bleeding at this time.    Eyes: Pupils are equal, round, and reactive to light.  Neck: Normal range of motion.  Cardiovascular: Normal rate, regular rhythm and normal heart sounds.   Pulmonary/Chest: Effort normal and breath sounds normal. No respiratory distress. He has no decreased breath sounds. He has no wheezes. He has no rhonchi. He has no rales.  Abdominal: Soft. He exhibits no distension. There is no tenderness. There is no rebound and no guarding.  Musculoskeletal: He exhibits no edema or tenderness.  Neurological: He is alert and oriented to person, place, and time. He exhibits normal muscle tone.  Skin: Skin is warm and dry.  Nursing note and vitals reviewed.   ED Course  EPISTAXIS MANAGEMENT Date/Time: 06/11/2014 8:30 PM Performed by: Bethann Berkshire Authorized by: Bethann Berkshire Consent: Verbal consent obtained. Risks and benefits: risks, benefits and alternatives were discussed Consent given by: patient and spouse Patient understanding: patient states understanding of the procedure being performed Patient consent: the patient's understanding of the procedure matches consent given Required items: required blood products, implants, devices, and special equipment available Patient identity confirmed: verbally with patient Time out: Immediately prior to procedure a "time out" was called to verify the correct patient, procedure, equipment, support staff and site/side marked as required. Patient sedated: no Treatment site: right anterior Repair method: suction and anterior pack Post-procedure  assessment: bleeding stopped Treatment complexity: simple Patient tolerance: Patient tolerated the procedure well with no immediate complications   (including critical care time) Labs Review Labs Reviewed - No data to display  Imaging Review No results found.   EKG Interpretation None      MDM   Final diagnoses:  Epistaxis    81 y/o M with PMH of dementia and plavix use here with a nose bleed intermittently for the past 6 hours.  Bleeding has currently stopped, but there are large clots within the oropharynx and the nares.  With patient's significant intermittent bleeds today I feel that he requires packing to ensure that he doesn't re-bleed after discharge.  Clots were evacuated using suction and anterior packing was placed as detailed above.  Patient tolerated without any significant problems.  Patient was observed for another hour with no return of bleeding.  I feel he is stable for discharge at this time.  With no tachycardia or hypotension I do not feel the patient requires a CBC or IV resuscitation at this time.  Patient instructed to follow-up with his PCP or here in two days for removal of his rhino rocket and to return to the ED with repeat bleeding, lethargy, shortness  of breath, or any other concerns.  Patient's wife expressed understanding.  He was discharged in a good condition.  Care discussed with my attending Dr. Preston FleetingGlick.     Bethann BerkshireAaron Efrat Zuidema, MD 06/11/14 2124  Dione Boozeavid Glick, MD 06/11/14 [phone]

## 2014-06-11 NOTE — ED Provider Notes (Signed)
81 year old male was sent from nursing home because of right-sided epistaxis. He had 2 episodes of bleeding from the right nostril. He is on clopidogrel but no other anticoagulants or antiplatelet agents. On exam, recent blood is present in the right nostril without active bleeding. He is at high risk for recurrence given his being on any platelet inhibitor. He is treated with a Rapid Rhino. Rapid Rhino insertion was done by Dr.Schmitt, emergency medicine resident, under my direct supervision and I was present for the procedure.  I saw and evaluated the patient, reviewed the resident's note and I agree with the findings and plan.     Dione Boozeavid Shanda Cadotte, MD 06/11/14 2037

## 2014-06-12 ENCOUNTER — Non-Acute Institutional Stay (SKILLED_NURSING_FACILITY): Payer: Medicare Other | Admitting: Adult Health

## 2014-06-12 ENCOUNTER — Encounter: Payer: Self-pay | Admitting: Adult Health

## 2014-06-12 DIAGNOSIS — F0393 Unspecified dementia, unspecified severity, with mood disturbance: Secondary | ICD-10-CM

## 2014-06-12 DIAGNOSIS — F0280 Dementia in other diseases classified elsewhere without behavioral disturbance: Secondary | ICD-10-CM

## 2014-06-12 DIAGNOSIS — I639 Cerebral infarction, unspecified: Secondary | ICD-10-CM

## 2014-06-12 DIAGNOSIS — I69959 Hemiplegia and hemiparesis following unspecified cerebrovascular disease affecting unspecified side: Secondary | ICD-10-CM

## 2014-06-12 DIAGNOSIS — F0150 Vascular dementia without behavioral disturbance: Secondary | ICD-10-CM

## 2014-06-12 DIAGNOSIS — G8190 Hemiplegia, unspecified affecting unspecified side: Secondary | ICD-10-CM

## 2014-06-12 DIAGNOSIS — G309 Alzheimer's disease, unspecified: Secondary | ICD-10-CM

## 2014-06-12 DIAGNOSIS — R1312 Dysphagia, oropharyngeal phase: Secondary | ICD-10-CM

## 2014-06-12 DIAGNOSIS — R569 Unspecified convulsions: Secondary | ICD-10-CM

## 2014-06-12 DIAGNOSIS — E1149 Type 2 diabetes mellitus with other diabetic neurological complication: Secondary | ICD-10-CM

## 2014-06-12 DIAGNOSIS — F329 Major depressive disorder, single episode, unspecified: Secondary | ICD-10-CM

## 2014-07-13 ENCOUNTER — Encounter: Payer: Self-pay | Admitting: Adult Health

## 2014-07-13 DIAGNOSIS — E1149 Type 2 diabetes mellitus with other diabetic neurological complication: Secondary | ICD-10-CM | POA: Insufficient documentation

## 2014-07-13 NOTE — Progress Notes (Signed)
Patient ID: Danny Gilbert, male   DOB: 10/28/1923, 81 y.o.   MRN: [id number]  [name]     No Known Allergies     Chief Complaint  Patient presents with  . Medical Management of Chronic Issues    HPI:    He is a long term resident of this facility being seen for the management of his chronic illnesses. He has had a nose bleed which required him to be taken to the ED for treatment. He has a nasal rocket will need to come out in the next couple of days. He is not voicing any concerns at this time. There are no nursing concerns at this time.    Past Medical History  Diagnosis Date  . Hyperlipidemia   . Depression   . Seizures   . Hypokalemia   . Dementia   . Hemiplegia affecting dominant side, late effect of cerebrovascular disease   . Constipation   . Edema   . Diabetes mellitus without complication   . Stroke   . Hypertension     No past surgical history on file.  VITAL SIGNS BP 146/80 mmHg  Pulse 84  Ht 6' (1.829 m)  Wt 167 lb (75.751 kg)  BMI 22.64 kg/m2  SpO2 97%   Outpatient Encounter Prescriptions as of 06/12/2014  Medication Sig  . acetaminophen (TYLENOL) 325 MG tablet Take 650 mg by mouth every 4 (four) hours as needed for mild pain or fever.  . baclofen (LIORESAL) 10 MG tablet Take 10 mg by mouth 2 (two) times daily.   . Brinzolamide-Brimonidine 1-0.2 % SUSP 1 gtt in both eyes BID  . carbamazepine (CARBATROL) 300 MG 12 hr capsule Take 1 capsule (300 mg total) by mouth 2 (two) times daily.  . carbamazepine (TEGRETOL) 200 MG tablet Take 200 mg by mouth daily.   . citalopram (CELEXA) 20 MG tablet Take 20 mg by mouth daily.  . clopidogrel (PLAVIX) 75 MG tablet Take 75 mg by mouth daily.  . Insulin Glargine (LANTUS SOLOSTAR) 100 UNIT/ML Solostar Pen Inject 10 Units into the skin at bedtime.  . mirtazapine (REMERON) 15 MG tablet Take 0.5 tablets (7.5 mg total) by mouth at bedtime. (Patient taking differently: Take 15 mg by mouth at bedtime. )    . morphine (ROXANOL) 20 MG/ML concentrated solution Take 0.12ml by mouth /under tongue every 4 hours as needed for severe pain (Patient taking differently: Take 5 mg by mouth every 8 (eight) hours as needed for severe pain. )  . Multiple Vitamin (MULTIVITAMIN) tablet Take 1 tablet by mouth daily.  Bertram Gala Glycol-Propyl Glycol (SYSTANE ULTRA) 0.4-0.3 % SOLN Apply 1 drop to eye 4 (four) times daily. Both eyes  . polyethylene glycol (MIRALAX / GLYCOLAX) packet Take 17 g by mouth daily.  . sennosides-docusate sodium (SENOKOT-S) 8.6-50 MG tablet Take 1 tablet by mouth 2 (two) times daily.  . [DISCONTINUED] Menthol (CEPACOL SORE THROAT) 5.4 MG LOZG 1 tablet po TID prn sore throat (Patient not taking: Reported on 07/13/2014)     SIGNIFICANT DIAGNOSTIC EXAMS   LABS REVIEWED:   02-25-14: chol 176; ldl 110; trig 110; hdl 44 78-29-56: tsh 0.867; hgb a1c 6.0; tegretol 9.9  05-17-14: wbc 7.1;hgb 11.0; hct 32.1; mcv 98.8; plt 195; glucose 128; bun 13; creat 0.91; k+4.6; na++138; urine micro-albumin <2.0    Review of Systems  Constitutional: Negative for malaise/fatigue.  HENT:       Without further nose bleeding present   Respiratory: Negative for cough  and shortness of breath.   Cardiovascular: Negative for chest pain and palpitations.  Gastrointestinal: Negative for abdominal pain and constipation.  Musculoskeletal: Negative for myalgias and joint pain.  Skin: Negative.   Neurological: Negative for headaches.  Psychiatric/Behavioral: The patient is not nervous/anxious.      Physical Exam  Constitutional: He appears well-developed and well-nourished. No distress.  Neck: Neck supple. No JVD present. No thyromegaly present.  Cardiovascular: Normal rate, regular rhythm and intact distal pulses.   Respiratory: Effort normal and breath sounds normal. No respiratory distress.  GI: Soft. Bowel sounds are normal. He exhibits no distension. There is no tenderness.  Musculoskeletal: He exhibits  no edema.  Has right side hemiparesis Is able to move left extremities   Neurological: He is alert.  Skin: Skin is warm and dry. He is not diaphoretic.       ASSESSMENT/ PLAN:  1. Seizures: no reports of seizure activity present; will continue tegretol er 300 mg twice daily and 200 mg daily at noon; will monitor his status.   2. Dysphagia: no signs of aspiration present; will continue nectar thick liquids  3. CVA with right hemiplegia: no change in neurological status; will continue baclofen 10 mg twice daily for spasticity will continue plavix 75 mg daily and will continue to monitor his status.   4. Diabetes: is stable hgb a1c is 6.0; will continue lantus 10 units daily and will monitor his status.   5. Depression: is stable does receive benefit from celexa 20 mg daily and is taking remeron 15 mg nightly will not make changes will monitor his status.   6. Constipation: will continue miralax daily and senna s twice daily   7. Glaucoma: will continue simbrinz to both eyes twice daily   8. Mixed Alzheimer's and vascular dementia: no change in overall status; is presently not taking medications; will not make changes will monitor his status.   9. Nose bleeds: no further nose present; will remove nasal rocket on Monday will monitor     Synthia Innocenteborah Debroh Sieloff NP [facility]  Contact [phone] Monday through Friday 8am- 5pm  After hours call [phone]

## 2014-07-13 NOTE — Progress Notes (Signed)
This encounter was created in error - please disregard.

## 2014-07-28 LAB — HEMOGLOBIN A1C: HEMOGLOBIN A1C: 6.3 % — AB (ref 4.0–6.0)

## 2014-08-03 ENCOUNTER — Encounter: Payer: Self-pay | Admitting: Adult Health

## 2014-08-03 ENCOUNTER — Non-Acute Institutional Stay (SKILLED_NURSING_FACILITY): Payer: Medicare Other | Admitting: Adult Health

## 2014-08-03 DIAGNOSIS — E1149 Type 2 diabetes mellitus with other diabetic neurological complication: Secondary | ICD-10-CM | POA: Diagnosis not present

## 2014-08-03 DIAGNOSIS — E785 Hyperlipidemia, unspecified: Secondary | ICD-10-CM

## 2014-08-03 DIAGNOSIS — R1312 Dysphagia, oropharyngeal phase: Secondary | ICD-10-CM | POA: Diagnosis not present

## 2014-08-03 DIAGNOSIS — F0150 Vascular dementia without behavioral disturbance: Secondary | ICD-10-CM

## 2014-08-03 DIAGNOSIS — K5900 Constipation, unspecified: Secondary | ICD-10-CM

## 2014-08-03 DIAGNOSIS — F0280 Dementia in other diseases classified elsewhere without behavioral disturbance: Secondary | ICD-10-CM

## 2014-08-03 DIAGNOSIS — G309 Alzheimer's disease, unspecified: Secondary | ICD-10-CM | POA: Diagnosis not present

## 2014-08-03 DIAGNOSIS — G8190 Hemiplegia, unspecified affecting unspecified side: Secondary | ICD-10-CM | POA: Diagnosis not present

## 2014-08-03 DIAGNOSIS — I69959 Hemiplegia and hemiparesis following unspecified cerebrovascular disease affecting unspecified side: Secondary | ICD-10-CM

## 2014-08-03 NOTE — Progress Notes (Signed)
Patient ID: Danny Gilbert, male   DOB: [birth year], 81 y.o.   MRN: [id number]  [name]     No Known Allergies     Chief Complaint  Patient presents with  . Annual Exam    HPI:  He is a long term resident of this facility being seen for his annual exam. He has not had a significant change in his status over the past year.  Other than a trip to the ED for a nose bleed in Jan of this year; he has not been hospitalized since Feb 2015.  There are no nursing concerns at this time.    Past Medical History  Diagnosis Date  . Hyperlipidemia   . Depression   . Seizures   . Hypokalemia   . Dementia   . Hemiplegia affecting dominant side, late effect of cerebrovascular disease   . Constipation   . Edema   . Diabetes mellitus without complication   . Stroke   . Hypertension     No past surgical history on file.  VITAL SIGNS BP 142/56 mmHg  Pulse 85  Ht 6' (1.829 m)  Wt 164 lb (74.39 kg)  BMI 22.24 kg/m2  SpO2 95%   Outpatient Encounter Prescriptions as of 08/03/2014  Medication Sig  . acetaminophen (TYLENOL) 325 MG tablet Take 650 mg by mouth every 4 (four) hours as needed for mild pain or fever.  . baclofen (LIORESAL) 10 MG tablet Take 10 mg by mouth 2 (two) times daily.   . Brinzolamide-Brimonidine 1-0.2 % SUSP 1 gtt in both eyes BID  . carbamazepine (CARBATROL) 300 MG 12 hr capsule Take 1 capsule (300 mg total) by mouth 2 (two) times daily.  . carbamazepine (TEGRETOL) 200 MG tablet Take 200 mg by mouth daily.   . citalopram (CELEXA) 20 MG tablet Take 20 mg by mouth daily.  . clopidogrel (PLAVIX) 75 MG tablet Take 75 mg by mouth daily.  . Insulin Glargine (LANTUS SOLOSTAR) 100 UNIT/ML Solostar Pen Inject 10 Units into the skin at bedtime.  Marland Kitchen latanoprost (XALATAN) 0.005 % ophthalmic solution Place 1 drop into both eyes at bedtime.  . mirtazapine (REMERON) 15 MG tablet Take 0.5 tablets (7.5 mg total) by mouth at bedtime.  Marland Kitchen morphine (ROXANOL) 20 MG/ML  concentrated solution Take 0.61ml by mouth /under tongue every 4 hours as needed for severe pain (Patient taking differently: Take 5 mg by mouth every 8 (eight) hours as needed for severe pain. )  . Multiple Vitamin (MULTIVITAMIN) tablet Take 1 tablet by mouth daily.  Bertram Gala Glycol-Propyl Glycol (SYSTANE ULTRA) 0.4-0.3 % SOLN Apply 1 drop to eye 4 (four) times daily. Both eyes  . polyethylene glycol (MIRALAX / GLYCOLAX) packet Take 17 g by mouth daily.  . sennosides-docusate sodium (SENOKOT-S) 8.6-50 MG tablet Take 1 tablet by mouth 2 (two) times daily.     SIGNIFICANT DIAGNOSTIC EXAMS   LABS REVIEWED:   02-25-14: chol 176; ldl 110; trig 110; hdl 44 40-98-11: tsh 0.867; hgb a1c 6.0; tegretol 9.9  05-17-14: wbc 7.1;hgb 11.0; hct 32.1; mcv 98.8; plt 195; glucose 128; bun 13; creat 0.91; k+4.6; na++138; urine micro-albumin <2.0     ROS Constitutional: Negative for malaise/fatigue.  HENT:       Without further nose bleeding present   Respiratory: Negative for cough  and shortness of breath.   Cardiovascular: Negative for chest pain and palpitations.  Gastrointestinal: Negative for abdominal pain and constipation.  Musculoskeletal: Negative for myalgias and joint pain.  Skin: Negative.   Neurological: Negative for headaches.  Psychiatric/Behavioral: The patient is not nervous/anxious.    Physical Exam Constitutional: He appears well-developed and well-nourished. No distress.  Neck: Neck supple. No JVD present. No thyromegaly present.  Cardiovascular: Normal rate, regular rhythm and intact distal pulses.   Respiratory: Effort normal and breath sounds normal. No respiratory distress.  GI: Soft. Bowel sounds are normal. He exhibits no distension. There is no tenderness.  Musculoskeletal: He exhibits no edema.  Has right side hemiparesis Is able to move left extremities   Neurological: He is alert.  Skin: Skin is warm and dry. He is not diaphoretic.     ASSESSMENT/  PLAN:   1. Seizures: no reports of seizure activity present; will continue tegretol er 300 mg twice daily and 200 mg daily at noon; will monitor his status.   2. Dysphagia: no signs of aspiration present; will continue nectar thick liquids  3. CVA with right hemiplegia: no change in neurological status; will continue baclofen 10 mg twice daily for spasticity will continue plavix 75 mg daily and will continue to monitor his status.   4. Diabetes: is stable hgb a1c is 6.0; will continue lantus 10 units daily and will monitor his status.   5. Depression: is stable does receive benefit from celexa 20 mg daily and is taking remeron 15 mg nightly will not make changes will monitor his status.   6. Constipation: will continue miralax daily and senna s twice daily   7. Glaucoma: will continue simbrinz to both eyes twice daily   8. Mixed Alzheimer's and vascular dementia: no change in overall status; is presently not taking medications; will not make changes will monitor his status.    Will check hgb a1c   Synthia Innocenteborah Green NP [facility]  Contact [phone] Monday through Friday 8am- 5pm  After hours call [phone]

## 2014-08-24 ENCOUNTER — Encounter: Payer: Self-pay | Admitting: Adult Health

## 2014-09-03 ENCOUNTER — Non-Acute Institutional Stay (SKILLED_NURSING_FACILITY): Payer: Medicare Other | Admitting: Adult Health

## 2014-09-03 DIAGNOSIS — G309 Alzheimer's disease, unspecified: Secondary | ICD-10-CM

## 2014-09-03 DIAGNOSIS — F0150 Vascular dementia without behavioral disturbance: Secondary | ICD-10-CM | POA: Diagnosis not present

## 2014-09-03 DIAGNOSIS — E1149 Type 2 diabetes mellitus with other diabetic neurological complication: Secondary | ICD-10-CM

## 2014-09-03 DIAGNOSIS — I639 Cerebral infarction, unspecified: Secondary | ICD-10-CM | POA: Diagnosis not present

## 2014-09-03 DIAGNOSIS — K5900 Constipation, unspecified: Secondary | ICD-10-CM | POA: Diagnosis not present

## 2014-09-03 DIAGNOSIS — R569 Unspecified convulsions: Secondary | ICD-10-CM | POA: Diagnosis not present

## 2014-09-03 DIAGNOSIS — R1312 Dysphagia, oropharyngeal phase: Secondary | ICD-10-CM

## 2014-09-03 DIAGNOSIS — G8190 Hemiplegia, unspecified affecting unspecified side: Secondary | ICD-10-CM | POA: Diagnosis not present

## 2014-09-03 DIAGNOSIS — I69959 Hemiplegia and hemiparesis following unspecified cerebrovascular disease affecting unspecified side: Secondary | ICD-10-CM

## 2014-09-03 DIAGNOSIS — F0280 Dementia in other diseases classified elsewhere without behavioral disturbance: Secondary | ICD-10-CM

## 2014-10-06 ENCOUNTER — Non-Acute Institutional Stay (SKILLED_NURSING_FACILITY): Payer: Medicare Other | Admitting: Internal Medicine

## 2014-10-06 ENCOUNTER — Encounter: Payer: Self-pay | Admitting: Internal Medicine

## 2014-10-06 DIAGNOSIS — E1149 Type 2 diabetes mellitus with other diabetic neurological complication: Secondary | ICD-10-CM

## 2014-10-06 DIAGNOSIS — I69959 Hemiplegia and hemiparesis following unspecified cerebrovascular disease affecting unspecified side: Secondary | ICD-10-CM

## 2014-10-06 DIAGNOSIS — E785 Hyperlipidemia, unspecified: Secondary | ICD-10-CM

## 2014-10-06 DIAGNOSIS — F329 Major depressive disorder, single episode, unspecified: Secondary | ICD-10-CM | POA: Diagnosis not present

## 2014-10-06 DIAGNOSIS — F0280 Dementia in other diseases classified elsewhere without behavioral disturbance: Secondary | ICD-10-CM

## 2014-10-06 DIAGNOSIS — G8190 Hemiplegia, unspecified affecting unspecified side: Secondary | ICD-10-CM | POA: Diagnosis not present

## 2014-10-06 DIAGNOSIS — F0393 Unspecified dementia, unspecified severity, with mood disturbance: Secondary | ICD-10-CM

## 2014-10-06 DIAGNOSIS — I6930 Unspecified sequelae of cerebral infarction: Secondary | ICD-10-CM | POA: Diagnosis not present

## 2014-10-06 DIAGNOSIS — G309 Alzheimer's disease, unspecified: Secondary | ICD-10-CM

## 2014-10-06 DIAGNOSIS — F0150 Vascular dementia without behavioral disturbance: Secondary | ICD-10-CM

## 2014-10-06 DIAGNOSIS — R569 Unspecified convulsions: Secondary | ICD-10-CM

## 2014-10-06 NOTE — Progress Notes (Signed)
Patient ID: Danny Gilbert, male   DOB: 02/26/1924, 81 y.o.   MRN: [id number]    DATE: 10/06/14  [facility]    Place of Service: SNF [phone])   Extended Emergency Contact Information Primary Emergency Contact: [facility][name] Address: [address]          [place], [place] [address] [place] Home Phone: [phone] Work Phone: [phone] Relation: Spouse Secondary Emergency Contact: Ryan,Rondha Address: [address]          [place], [place] [address] [place] Home Phone: [phone] Relation: Daughter  Advanced Directive information  DNR  Chief Complaint  Patient presents with  . Medical Management of Chronic Issues    HPI:  81 yo male long term resident seen today for f/u. He states that "I am ready to go home". He has no concerns or c/o today. No nursing issues. No falls. He is eating with restorative and sleeping well. He is a poor historian due to dementia. Hx obtained from chart  No seizures on tegretol  Mood stable on celexa. He does not take med for dementia  He has a hx CVA and is taking plavix. blaclofen helps spastic muscles. He is not on a statin. Cholesterol is diet controlled. He is taking remeron for reduced appetite  He has glaucoma and uses eye gtts to control pressure  DM controlled on lantus 10 units daily. CBG 110-150s and rarely >180. No low BS reactions.  Pain controlled with roxanol prn. He has a bowel regiman in place    Past Medical History  Diagnosis Date  . Hyperlipidemia   . Depression   . Seizures   . Hypokalemia   . Dementia   . Hemiplegia affecting dominant side, late effect of cerebrovascular disease   . Constipation   . Edema   . Diabetes mellitus without complication   . Stroke   . Hypertension   . CVA (cerebral vascular accident) 08/28/2012    No past surgical history on file.  Patient Care Team: Kirt BoysMonica Avereigh Spainhower, DO as PCP - General (Internal Medicine) Sharee Holstereborah S  Green, NP as Nurse Practitioner (Nurse Practitioner)  History   Social History  . Marital Status: Married    Spouse Name: N/A  . Number of Children: N/A  . Years of Education: N/A   Occupational History  . Not on file.   Social History Main Topics  . Smoking status: Never Smoker   . Smokeless tobacco: Never Used  . Alcohol Use: No  . Drug Use: No  . Sexual Activity: No   Other Topics Concern  . Not on file   Social History Narrative     reports that he has never smoked. He has never used smokeless tobacco. He reports that he does not drink alcohol or use illicit drugs.  Immunization History  Administered Date(s) Administered  . Influenza-Unspecified 03/05/2014  . Pneumococcal-Unspecified 03/29/2010, 03/17/2014    No Known Allergies  Medications: Patient's Medications  New Prescriptions   No medications on file  Previous Medications   ACETAMINOPHEN (TYLENOL) 325 MG TABLET    Take 650 mg by mouth every 4 (four) hours as needed for mild pain or fever.   BACLOFEN (LIORESAL) 10 MG TABLET    Take 10 mg by mouth 2 (two) times daily.    BRINZOLAMIDE-BRIMONIDINE 1-0.2 % SUSP    1 gtt in both eyes BID   CARBAMAZEPINE (CARBATROL) 300 MG 12 HR CAPSULE    Take  1 capsule (300 mg total) by mouth 2 (two) times daily.   CARBAMAZEPINE (TEGRETOL) 200 MG TABLET    Take 200 mg by mouth daily.    CITALOPRAM (CELEXA) 20 MG TABLET    Take 20 mg by mouth daily.   CLOPIDOGREL (PLAVIX) 75 MG TABLET    Take 75 mg by mouth daily.   INSULIN GLARGINE (LANTUS SOLOSTAR) 100 UNIT/ML SOLOSTAR PEN    Inject 10 Units into the skin at bedtime.   LATANOPROST (XALATAN) 0.005 % OPHTHALMIC SOLUTION    Place 1 drop into both eyes at bedtime.   MIRTAZAPINE (REMERON) 15 MG TABLET    Take 0.5 tablets (7.5 mg total) by mouth at bedtime.   MORPHINE (ROXANOL) 20 MG/ML CONCENTRATED SOLUTION    Take 0.79ml by mouth /under tongue every 4 hours as needed for severe pain   MULTIPLE VITAMIN (MULTIVITAMIN) TABLET     Take 1 tablet by mouth daily.   POLYETHYL GLYCOL-PROPYL GLYCOL (SYSTANE ULTRA) 0.4-0.3 % SOLN    Apply 1 drop to eye 4 (four) times daily. Both eyes   POLYETHYLENE GLYCOL (MIRALAX / GLYCOLAX) PACKET    Take 17 g by mouth daily.   SENNOSIDES-DOCUSATE SODIUM (SENOKOT-S) 8.6-50 MG TABLET    Take 1 tablet by mouth 2 (two) times daily.  Modified Medications   No medications on file  Discontinued Medications   No medications on file    Review of Systems  Unable to perform ROS: Dementia    Filed Vitals:   10/06/14 1641  BP: 140/72  Pulse: 81  Temp: 97.7 F (36.5 C)  Weight: 165 lb (74.844 kg)  SpO2: 95%   Body mass index is 22.37 kg/(m^2).  Physical Exam  Constitutional: He appears well-developed and well-nourished. No distress.  HENT:  Mouth/Throat: Oropharynx is clear and moist.  Eyes: Pupils are equal, round, and reactive to light. No scleral icterus.  Neck: Neck supple. Carotid bruit is not present. No thyromegaly present.  Cardiovascular: Normal rate, regular rhythm and intact distal pulses.  Exam reveals no gallop and no friction rub.   Murmur (1/6 holosystolic murmur) heard. Trace LE edema b/l. No calf TTP  Pulmonary/Chest: Effort normal and breath sounds normal. He has no wheezes. He has no rales. He exhibits no tenderness.  Abdominal: Soft. Bowel sounds are normal. He exhibits no distension, no abdominal bruit, no pulsatile midline mass and no mass. There is no tenderness. There is no rebound and no guarding.  Musculoskeletal: He exhibits edema and tenderness.  Lymphadenopathy:    He has no cervical adenopathy.  Neurological: He is alert. He exhibits abnormal muscle tone.  Right hemiparesis with muscle atrophy and spasticity noted  Skin: Skin is warm and dry. No rash noted.  Very dry  Psychiatric: He has a normal mood and affect. His behavior is normal.     Labs reviewed: No visits with results within 3 Month(s) from this visit. Latest known visit with results  is:  Orders Only on 02/25/2014  Component Date Value Ref Range Status  . Cholesterol 02/25/2014 176  0 - 200 mg/dL Final   Comment: ATP III Classification:                                < 200        mg/dL        Desirable  200 - 239     mg/dL        Borderline High                               >= 240        mg/dL        High                             . Triglycerides 02/25/2014 110  <150 mg/dL Final  . HDL 16/02/9603 44  >39 mg/dL Final  . Total CHOL/HDL Ratio 02/25/2014 4.0   Final  . VLDL 02/25/2014 22  0 - 40 mg/dL Final  . LDL Cholesterol 02/25/2014 110* 0 - 99 mg/dL Final   Comment:                            Total Cholesterol/HDL Ratio:CHD Risk                                                 Coronary Heart Disease Risk Table                                                                 Men       Women                                   1/2 Average Risk              3.4        3.3                                       Average Risk              5.0        4.4                                    2X Average Risk              9.6        7.1                                    3X Average Risk             23.4       11.0                          Use the calculated Patient Ratio above and the CHD Risk table  to determine the patient's CHD Risk.                          ATP III Classification (LDL):                                < 100        mg/dL         Optimal                               100 - 129     mg/dL         Near or Above Optimal                               130 - 159     mg/dL         Borderline High                               160 - 189     mg/dL         High                                > 190        mg/dL         Very High                               No results found.   Assessment/Plan   ICD-9-CM ICD-10-CM   1. Mixed Alzheimer's and vascular dementia -stable 331.0 G30.9    294.10 F01.50    290.40  F02.80   2. Depression due to dementia - stable 311 F32.9    294.10 F02.80   3. DM (diabetes mellitus) type II controlled, neurological manifestation - controlled 250.60 E11.49   4. Seizures - stable 780.39 R56.9   5. Hemiplegia of dominant side as late effect following cerebrovascular disease - stable 438.21 G81.90   6. Hyperlipidemia - stable 272.4 E78.5   7. History of stroke with residual deficit -stable 438.9 I69.30     --check tegretol and CMP  --cont current meds as ordered  --PT/OT/ST as indicated  --check CBG qAM  --will follow  Ernestina Joe S. Ancil Linseyarter, D. O., F. A. C. O. I.  [facility] [address] [place], [place] [address] [phone] Cell (Monday-Friday 8 AM - 5 PM) [phone] After 5 PM and follow prompts

## 2014-11-09 NOTE — Progress Notes (Signed)
Patient ID: Danny Gilbert, male   DOB: 26-Feb-1924, 81 y.o.   MRN: [id number]  [name] [place]     No Known Allergies     Chief Complaint  Patient presents with  . Medical Management of Chronic Issues    HPI:  He is a long term resident of this facility being seen for the management of his chronic illnesses. Overall there is little change in his status. He is not voicing any concerns at this time. There are no nursing concerns at this time.    Past Medical History  Diagnosis Date  . Hyperlipidemia   . Depression   . Seizures   . Hypokalemia   . Dementia   . Hemiplegia affecting dominant side, late effect of cerebrovascular disease   . Constipation   . Edema   . Diabetes mellitus without complication   . Stroke   . Hypertension   . CVA (cerebral vascular accident) 08/28/2012    No past surgical history on file.  VITAL SIGNS BP 144/70 mmHg  Pulse 100  Ht 6' (1.829 m)  Wt 168 lb (76.204 kg)  BMI 22.78 kg/m2   Outpatient Encounter Prescriptions as of 09/03/2014  Medication Sig  . acetaminophen (TYLENOL) 325 MG tablet Take 650 mg by mouth every 4 (four) hours as needed for mild pain or fever.  . baclofen (LIORESAL) 10 MG tablet Take 10 mg by mouth 2 (two) times daily.   . Brinzolamide-Brimonidine 1-0.2 % SUSP 1 gtt in both eyes BID  . carbamazepine (CARBATROL) 300 MG 12 hr capsule Take 1 capsule (300 mg total) by mouth 2 (two) times daily.  . carbamazepine (TEGRETOL) 200 MG tablet Take 200 mg by mouth daily.   . citalopram (CELEXA) 20 MG tablet Take 20 mg by mouth daily.  . clopidogrel (PLAVIX) 75 MG tablet Take 75 mg by mouth daily.  . Insulin Glargine (LANTUS SOLOSTAR) 100 UNIT/ML Solostar Pen Inject 10 Units into the skin at bedtime.  Marland Kitchen latanoprost (XALATAN) 0.005 % ophthalmic solution Place 1 drop into both eyes at bedtime.  . mirtazapine (REMERON) 15 MG tablet Take 0.5 tablets (7.5 mg total) by mouth at bedtime.  Marland Kitchen morphine (ROXANOL) 20 MG/ML  concentrated solution  Take 5 mg by mouth every 8 (eight) hours as needed for severe pain. )  . Multiple Vitamin (MULTIVITAMIN) tablet Take 1 tablet by mouth daily.  Bertram Gala Glycol-Propyl Glycol (SYSTANE ULTRA) 0.4-0.3 % SOLN Apply 1 drop to eye 4 (four) times daily. Both eyes  . polyethylene glycol (MIRALAX / GLYCOLAX) packet Take 17 g by mouth daily.  . sennosides-docusate sodium (SENOKOT-S) 8.6-50 MG tablet Take 1 tablet by mouth 2 (two) times daily.      SIGNIFICANT DIAGNOSTIC EXAMS  07-24-14: chest x-ray; right lower lobe atelectasis and mild cardiomegaly; no infiltrate no congestion    LABS REVIEWED:   02-25-14: chol 176; ldl 110; trig 110; hdl 44 53-74-82: tsh 0.867; hgb a1c 6.0; tegretol 9.9  05-17-14: wbc 7.1;hgb 11.0; hct 32.1; mcv 98.8; plt 195; glucose 128; bun 13; creat 0.91; k+4.6; na++138; urine micro-albumin <2.0  08-05-14: hgb a1c 6.3    ROS Constitutional: Negative for malaise/fatigue.  Respiratory: Negative for cough  and shortness of breath.   Cardiovascular: Negative for chest pain and palpitations.  Gastrointestinal: Negative for abdominal pain and constipation.  Musculoskeletal: Negative for myalgias and joint pain.  Skin: Negative.   Neurological: Negative for headaches.  Psychiatric/Behavioral: The patient is not nervous/anxious.       Physical  Exam Constitutional: He appears well-developed and well-nourished. No distress.  Neck: Neck supple. No JVD present. No thyromegaly present.  Cardiovascular: Normal rate, regular rhythm and intact distal pulses.   Respiratory: Effort normal and breath sounds normal. No respiratory distress.  GI: Soft. Bowel sounds are normal. He exhibits no distension. There is no tenderness.  Musculoskeletal: He exhibits no edema.  Has right side hemiparesis Is able to move left extremities   Neurological: He is alert.  Skin: Skin is warm and dry. He is not diaphoretic.       ASSESSMENT/ PLAN:  1. Seizures: no  reports of seizure activity present; will continue tegretol er 300 mg twice daily and 200 mg daily at noon; will monitor his status.   2. Dysphagia: no signs of aspiration present; will continue nectar thick liquids  3. CVA with right hemiplegia: no change in neurological status; will continue baclofen 10 mg twice daily for spasticity will continue plavix 75 mg daily and will continue to monitor his status.   4. Diabetes: is stable hgb a1c is 6.3; will continue lantus 10 units daily and will monitor his status.   5. Depression: is stable does receive benefit from celexa 20 mg daily and is taking remeron 15 mg nightly will not make changes will monitor his status.   6. Constipation: will continue miralax daily and senna s twice daily   7. Glaucoma: will continue simbrinz to both eyes twice daily   8. Mixed Alzheimer's and vascular dementia: no change in overall status; is presently not taking medications; will not make changes will monitor his status.       Synthia Innocent NP [facility]  Contact [phone] Monday through Friday 8am- 5pm  After hours call [phone]

## 2014-11-27 ENCOUNTER — Non-Acute Institutional Stay (SKILLED_NURSING_FACILITY): Payer: Medicare Other | Admitting: Adult Health

## 2014-11-27 DIAGNOSIS — F329 Major depressive disorder, single episode, unspecified: Secondary | ICD-10-CM | POA: Diagnosis not present

## 2014-11-27 DIAGNOSIS — G8190 Hemiplegia, unspecified affecting unspecified side: Secondary | ICD-10-CM

## 2014-11-27 DIAGNOSIS — R569 Unspecified convulsions: Secondary | ICD-10-CM

## 2014-11-27 DIAGNOSIS — F0150 Vascular dementia without behavioral disturbance: Secondary | ICD-10-CM | POA: Diagnosis not present

## 2014-11-27 DIAGNOSIS — G309 Alzheimer's disease, unspecified: Secondary | ICD-10-CM | POA: Diagnosis not present

## 2014-11-27 DIAGNOSIS — I6930 Unspecified sequelae of cerebral infarction: Secondary | ICD-10-CM | POA: Diagnosis not present

## 2014-11-27 DIAGNOSIS — F0280 Dementia in other diseases classified elsewhere without behavioral disturbance: Secondary | ICD-10-CM | POA: Diagnosis not present

## 2014-11-27 DIAGNOSIS — F0393 Unspecified dementia, unspecified severity, with mood disturbance: Secondary | ICD-10-CM

## 2014-11-27 DIAGNOSIS — K5900 Constipation, unspecified: Secondary | ICD-10-CM

## 2014-11-27 DIAGNOSIS — Z794 Long term (current) use of insulin: Secondary | ICD-10-CM | POA: Diagnosis not present

## 2014-11-27 DIAGNOSIS — I69959 Hemiplegia and hemiparesis following unspecified cerebrovascular disease affecting unspecified side: Secondary | ICD-10-CM

## 2014-11-27 DIAGNOSIS — E1140 Type 2 diabetes mellitus with diabetic neuropathy, unspecified: Secondary | ICD-10-CM | POA: Diagnosis not present

## 2014-11-27 DIAGNOSIS — R1312 Dysphagia, oropharyngeal phase: Secondary | ICD-10-CM

## 2014-12-28 ENCOUNTER — Non-Acute Institutional Stay (SKILLED_NURSING_FACILITY): Payer: Medicare Other | Admitting: Adult Health

## 2014-12-28 DIAGNOSIS — F0280 Dementia in other diseases classified elsewhere without behavioral disturbance: Secondary | ICD-10-CM | POA: Diagnosis not present

## 2014-12-28 DIAGNOSIS — F0150 Vascular dementia without behavioral disturbance: Secondary | ICD-10-CM | POA: Diagnosis not present

## 2014-12-28 DIAGNOSIS — F0393 Unspecified dementia, unspecified severity, with mood disturbance: Secondary | ICD-10-CM

## 2014-12-28 DIAGNOSIS — E1140 Type 2 diabetes mellitus with diabetic neuropathy, unspecified: Secondary | ICD-10-CM | POA: Diagnosis not present

## 2014-12-28 DIAGNOSIS — R1312 Dysphagia, oropharyngeal phase: Secondary | ICD-10-CM

## 2014-12-28 DIAGNOSIS — R569 Unspecified convulsions: Secondary | ICD-10-CM | POA: Diagnosis not present

## 2014-12-28 DIAGNOSIS — G8190 Hemiplegia, unspecified affecting unspecified side: Secondary | ICD-10-CM

## 2014-12-28 DIAGNOSIS — Z794 Long term (current) use of insulin: Secondary | ICD-10-CM

## 2014-12-28 DIAGNOSIS — F329 Major depressive disorder, single episode, unspecified: Secondary | ICD-10-CM

## 2014-12-28 DIAGNOSIS — I6930 Unspecified sequelae of cerebral infarction: Secondary | ICD-10-CM

## 2014-12-28 DIAGNOSIS — G309 Alzheimer's disease, unspecified: Secondary | ICD-10-CM | POA: Diagnosis not present

## 2014-12-28 DIAGNOSIS — I69959 Hemiplegia and hemiparesis following unspecified cerebrovascular disease affecting unspecified side: Secondary | ICD-10-CM

## 2015-02-23 ENCOUNTER — Encounter: Payer: Self-pay | Admitting: Internal Medicine

## 2015-02-23 ENCOUNTER — Non-Acute Institutional Stay (SKILLED_NURSING_FACILITY): Payer: Medicare Other | Admitting: Internal Medicine

## 2015-02-23 DIAGNOSIS — H109 Unspecified conjunctivitis: Secondary | ICD-10-CM

## 2015-02-23 DIAGNOSIS — F0280 Dementia in other diseases classified elsewhere without behavioral disturbance: Secondary | ICD-10-CM

## 2015-02-23 DIAGNOSIS — E1149 Type 2 diabetes mellitus with other diabetic neurological complication: Secondary | ICD-10-CM

## 2015-02-23 DIAGNOSIS — F0150 Vascular dementia without behavioral disturbance: Secondary | ICD-10-CM | POA: Diagnosis not present

## 2015-02-23 DIAGNOSIS — G309 Alzheimer's disease, unspecified: Secondary | ICD-10-CM | POA: Diagnosis not present

## 2015-02-23 NOTE — Progress Notes (Signed)
Patient ID: KAHEEM HALLECK, male   DOB: Oct 12, 1923, 81 y.o.   MRN: [id number]    DATE: 02/23/15  Location:  [facility]    Place of Service: SNF [phone])   Extended Emergency Contact Information Primary Emergency Contact: [facility] Address: [address]          [place], [place] [address] [place] Home Phone: [phone] Work Phone: [phone] Relation: Spouse Secondary Emergency Contact: Ryan,Rondha Address: [address]          [place], [place] [address] [place] Home Phone: [phone] Relation: Daughter  Advanced Directive information  DNR  Chief Complaint  Patient presents with  . Acute Visit    eye d/c    HPI:  81 yo male seen today for R>L eye d/c. Nursing noticed green d/c from OD yesterday. Pt reports eyes itch and are painful. He c/o sore throat and has noticed a d/c. Pt is a poor historian due to dementia. Hx obtained from chart and nursing. CBGs 100-120s. No low BS reactions. Dementia stable and he does not take any medication to control sx's.  Past Medical History  Diagnosis Date  . Hyperlipidemia   . Depression   . Seizures   . Hypokalemia   . Dementia   . Hemiplegia affecting dominant side, late effect of cerebrovascular disease   . Constipation   . Edema   . Diabetes mellitus without complication   . Stroke   . Hypertension   . CVA (cerebral vascular accident) 08/28/2012    No past surgical history on file.  Patient Care Team: Kirt Boys, DO as PCP - General (Internal Medicine) Sharee Holster, NP as Nurse Practitioner (Nurse Practitioner)  Social History   Social History  . Marital Status: Married    Spouse Name: N/A  . Number of Children: N/A  . Years of Education: N/A   Occupational History  . Not on file.   Social History Main Topics  . Smoking status: Never Smoker   . Smokeless tobacco: Never Used  . Alcohol Use: No  . Drug Use: No  . Sexual Activity: No   Other Topics  Concern  . Not on file   Social History Narrative     reports that he has never smoked. He has never used smokeless tobacco. He reports that he does not drink alcohol or use illicit drugs.  Immunization History  Administered Date(s) Administered  . Influenza-Unspecified 03/05/2014  . Pneumococcal-Unspecified 03/29/2010, 03/17/2014    No Known Allergies  Medications: Patient's Medications  New Prescriptions   No medications on file  Previous Medications   ACETAMINOPHEN (TYLENOL) 325 MG TABLET    Take 650 mg by mouth every 4 (four) hours as needed for mild pain or fever.   BACLOFEN (LIORESAL) 10 MG TABLET    Take 10 mg by mouth 2 (two) times daily.    BRINZOLAMIDE-BRIMONIDINE 1-0.2 % SUSP    1 gtt in both eyes BID   CARBAMAZEPINE (CARBATROL) 300 MG 12 HR CAPSULE    Take 1 capsule (300 mg total) by mouth 2 (two) times daily.   CARBAMAZEPINE (TEGRETOL) 200 MG TABLET    Take 200 mg by mouth daily.    CITALOPRAM (CELEXA) 20 MG TABLET    Take 20 mg by mouth daily.   CLOPIDOGREL (PLAVIX) 75 MG TABLET    Take 75 mg by mouth daily.   INSULIN GLARGINE (LANTUS SOLOSTAR) 100 UNIT/ML SOLOSTAR PEN    Inject 10  Units into the skin at bedtime.   LATANOPROST (XALATAN) 0.005 % OPHTHALMIC SOLUTION    Place 1 drop into both eyes at bedtime.   MIRTAZAPINE (REMERON) 15 MG TABLET    Take 0.5 tablets (7.5 mg total) by mouth at bedtime.   MORPHINE (ROXANOL) 20 MG/ML CONCENTRATED SOLUTION    Take 0.34ml by mouth /under tongue every 4 hours as needed for severe pain   MULTIPLE VITAMIN (MULTIVITAMIN) TABLET    Take 1 tablet by mouth daily.   POLYETHYL GLYCOL-PROPYL GLYCOL (SYSTANE ULTRA) 0.4-0.3 % SOLN    Apply 1 drop to eye 4 (four) times daily. Both eyes   POLYETHYLENE GLYCOL (MIRALAX / GLYCOLAX) PACKET    Take 17 g by mouth daily.   SENNOSIDES-DOCUSATE SODIUM (SENOKOT-S) 8.6-50 MG TABLET    Take 1 tablet by mouth 2 (two) times daily.  Modified Medications   No medications on file  Discontinued  Medications   No medications on file    Review of Systems  Unable to perform ROS: Dementia    Filed Vitals:   02/23/15 1618  BP: 140/78  Pulse: 80  Temp: 98.2 F (36.8 C)  Weight: 172 lb (78.019 kg)  SpO2: 95%   Body mass index is 23.32 kg/(m^2).  Physical Exam  Constitutional: He appears well-developed. No distress.  Frail appearing sitting up in bed. NAD  HENT:  Mouth/Throat: Oropharynx is clear and moist. No oropharyngeal exudate.  Eyes: EOM are normal. Pupils are equal, round, and reactive to light. Right eye exhibits discharge (green d/c lateral eye) and exudate. Right eye exhibits no chemosis and no hordeolum. No foreign body present in the right eye. Left eye exhibits no chemosis, no exudate and no hordeolum. No foreign body present in the left eye. Right conjunctiva is injected. Right conjunctiva has no hemorrhage. Left conjunctiva is injected. Left conjunctiva has no hemorrhage. No scleral icterus.  Min corneal redness OU with conjunctival injection. No orbital TTP. No swelling  Neck: No tracheal deviation present.  NT with no palpable nodes  Lymphadenopathy:    He has no cervical adenopathy.  Neurological: He is alert.  Skin: Skin is warm and dry. No rash noted.  Psychiatric: He has a normal mood and affect. His behavior is normal.     Labs reviewed: No visits with results within 3 Month(s) from this visit. Latest known visit with results is:  Nursing Home on 10/06/2014  Component Date Value Ref Range Status  . Hgb A1c MFr Bld 07/28/2014 6.3* 4.0 - 6.0 % Final    No results found.   Assessment/Plan   ICD-9-CM ICD-10-CM   1. Conjunctivitis of right eye - new 372.30 H10.9   2. DM (diabetes mellitus) type II controlled, neurological manifestation - stable 250.60 E11.49   3. Mixed Alzheimer's and vascular dementia - stable 331.0 G30.9    294.10 F01.50    290.40 F02.80     --cipro ophthalmic 0.3% gtts 1 gtt in OU TID x 5 days  --cont other meds as  ordered  --will follow   Monica S. Ancil Linsey  [facility] [address] [place], [place] [address] [phone] Cell (Monday-Friday 8 AM - 5 PM) [phone] After 5 PM and follow prompts

## 2015-02-23 NOTE — Progress Notes (Signed)
This encounter was created in error - please disregard.

## 2015-02-27 ENCOUNTER — Encounter: Payer: Self-pay | Admitting: Adult Health

## 2015-02-27 NOTE — Progress Notes (Signed)
Patient ID: Danny Gilbert, male   DOB: Dec 09, 1923, 81 y.o.   MRN: [id number]   Facility: [facility]      No Known Allergies  Chief Complaint  Patient presents with  . Medical Management of Chronic Issues    HPI:  He is a long term resident of this facility being seen for the management of his chronic illnesses. Overall there is little change in his status. He is not able to fully participate in the hpi or ros but states that he is feeling good. There are no nursing concerns at this time.    Past Medical History  Diagnosis Date  . Hyperlipidemia   . Depression   . Seizures (HCC)   . Hypokalemia   . Dementia   . Hemiplegia affecting dominant side, late effect of cerebrovascular disease   . Constipation   . Edema   . Diabetes mellitus without complication (HCC)   . Stroke (HCC)   . Hypertension   . CVA (cerebral vascular accident) (HCC) 08/28/2012    No past surgical history on file.  VITAL SIGNS BP 148/70 mmHg  Pulse 88  Ht 6' (1.829 m)  Wt 171 lb (77.565 kg)  BMI 23.19 kg/m2  Patient's Medications  New Prescriptions   No medications on file  Previous Medications   ACETAMINOPHEN (TYLENOL) 325 MG TABLET    Take 650 mg by mouth every 4 (four) hours as needed for mild pain or fever.   BACLOFEN (LIORESAL) 10 MG TABLET    Take 10 mg by mouth 2 (two) times daily.    BRINZOLAMIDE-BRIMONIDINE 1-0.2 % SUSP    1 gtt in both eyes BID   CARBAMAZEPINE (CARBATROL) 300 MG 12 HR CAPSULE    Take 1 capsule (300 mg total) by mouth 2 (two) times daily.   CARBAMAZEPINE (TEGRETOL) 200 MG TABLET    Take 200 mg by mouth daily.    CITALOPRAM (CELEXA) 20 MG TABLET    Take 20 mg by mouth daily.   CLOPIDOGREL (PLAVIX) 75 MG TABLET    Take 75 mg by mouth daily.   INSULIN GLARGINE (LANTUS SOLOSTAR) 100 UNIT/ML SOLOSTAR PEN    Inject 10 Units into the skin at bedtime.   LATANOPROST (XALATAN) 0.005 % OPHTHALMIC SOLUTION    Place 1 drop into both eyes at bedtime.   MIRTAZAPINE  (REMERON) 15 MG TABLET    Take 0.5 tablets (7.5 mg total) by mouth at bedtime.   MORPHINE (ROXANOL) 20 MG/ML CONCENTRATED SOLUTION    Take 0.48ml by mouth /under tongue every 4 hours as needed for severe pain   MULTIPLE VITAMIN (MULTIVITAMIN) TABLET    Take 1 tablet by mouth daily.   POLYETHYL GLYCOL-PROPYL GLYCOL (SYSTANE ULTRA) 0.4-0.3 % SOLN    Apply 1 drop to eye 4 (four) times daily. Both eyes   POLYETHYLENE GLYCOL (MIRALAX / GLYCOLAX) PACKET    Take 17 g by mouth daily.   SENNOSIDES-DOCUSATE SODIUM (SENOKOT-S) 8.6-50 MG TABLET    Take 1 tablet by mouth 2 (two) times daily.  Modified Medications   No medications on file  Discontinued Medications   No medications on file     SIGNIFICANT DIAGNOSTIC EXAMS   07-24-14: chest x-ray; right lower lobe atelectasis and mild cardiomegaly; no infiltrate no congestion    LABS REVIEWED:   02-25-14: chol 176; ldl 110; trig 110; hdl 44 40-98-11: tsh 0.867; hgb a1c 6.0; tegretol 9.9  05-17-14: wbc 7.1;hgb 11.0; hct 32.1; mcv 98.8; plt 195; glucose 128; bun  13; creat 0.91; k+4.6; na++138; urine micro-albumin <2.0  08-05-14: hgb a1c 6.3 10-07-14; glucose 94; bun 12; creat 1.06; k+ 4.5; na++ 139; liver normal albumin 4.0; tegretol 10.5       Review of Systems  Unable to perform ROS: Dementia      Physical Exam  Constitutional: He appears well-developed and well-nourished. No distress.  Eyes: Conjunctivae are normal.  Neck: Neck supple. No JVD present. No thyromegaly present.  Cardiovascular: Normal rate, regular rhythm and intact distal pulses.   Respiratory: Effort normal and breath sounds normal. No respiratory distress. He has no wheezes.  GI: Soft. Bowel sounds are normal. He exhibits no distension. There is no tenderness.  Musculoskeletal: He exhibits no edema.  Has right side hemiparesis Is able to move left extremities    Lymphadenopathy:    He has no cervical adenopathy.  Neurological: He is alert.  Skin: Skin is warm and dry. He  is not diaphoretic.  Psychiatric: He has a normal mood and affect.       ASSESSMENT/ PLAN:  1. Seizures: no reports of seizure activity present; will continue tegretol er 300 mg twice daily and 200 mg daily at noon; will monitor his status.   2. Dysphagia: no signs of aspiration present; will continue nectar thick liquids  3. CVA with right hemiplegia: no change in neurological status; will continue baclofen 10 mg twice daily for spasticity will continue plavix 75 mg daily and will continue to monitor his status.   4. Diabetes: is stable hgb a1c is 6.3; will continue lantus 10 units daily and will monitor his status.   5. Depression: is stable does receive benefit from celexa 20 mg daily and is taking remeron 7.5 mg nightly will not make changes will monitor his status.   6. Constipation: will continue miralax daily and senna s twice daily   7. Glaucoma: will continue simbrinz to both eyes twice daily   8. Mixed Alzheimer's and vascular dementia: no change in overall status; is presently not taking medications; will not make changes will monitor his status. His weight is stable at 171 pounds.    Will check cbc; lipids and hgb a1c     Synthia Innocent NP [facility]  Contact [phone] Monday through Friday 8am- 5pm  After hours call [phone]

## 2015-03-01 ENCOUNTER — Non-Acute Institutional Stay (SKILLED_NURSING_FACILITY): Payer: Medicare Other | Admitting: Adult Health

## 2015-03-01 DIAGNOSIS — E1140 Type 2 diabetes mellitus with diabetic neuropathy, unspecified: Secondary | ICD-10-CM

## 2015-03-01 DIAGNOSIS — I69959 Hemiplegia and hemiparesis following unspecified cerebrovascular disease affecting unspecified side: Secondary | ICD-10-CM

## 2015-03-01 DIAGNOSIS — R1312 Dysphagia, oropharyngeal phase: Secondary | ICD-10-CM | POA: Diagnosis not present

## 2015-03-01 DIAGNOSIS — K5901 Slow transit constipation: Secondary | ICD-10-CM | POA: Diagnosis not present

## 2015-03-01 DIAGNOSIS — Z794 Long term (current) use of insulin: Secondary | ICD-10-CM | POA: Diagnosis not present

## 2015-03-01 DIAGNOSIS — F0280 Dementia in other diseases classified elsewhere without behavioral disturbance: Secondary | ICD-10-CM

## 2015-03-01 DIAGNOSIS — G309 Alzheimer's disease, unspecified: Secondary | ICD-10-CM

## 2015-03-01 DIAGNOSIS — F0150 Vascular dementia without behavioral disturbance: Secondary | ICD-10-CM

## 2015-03-01 DIAGNOSIS — F329 Major depressive disorder, single episode, unspecified: Secondary | ICD-10-CM | POA: Diagnosis not present

## 2015-03-01 DIAGNOSIS — R569 Unspecified convulsions: Secondary | ICD-10-CM | POA: Diagnosis not present

## 2015-03-01 DIAGNOSIS — F0393 Unspecified dementia, unspecified severity, with mood disturbance: Secondary | ICD-10-CM

## 2015-03-29 ENCOUNTER — Encounter: Payer: Self-pay | Admitting: Adult Health

## 2015-03-29 NOTE — Progress Notes (Signed)
Patient ID: Danny Gilbert, male   DOB: 09/11/1923, 81 y.o.   MRN: [id number]    Facility[facility]      No Known Allergies  Chief Complaint  Patient presents with  . Medical Management of Chronic Issues    HPI:  He is a long term resident of this facility being seen for the management of his chronic illnesses. His status is without significant change.  He is unable to participate in the hpi or ros. There are no nursing concerns at this time. His current weight is 174 pounds; which is stable.    Past Medical History  Diagnosis Date  . Hyperlipidemia   . Depression   . Seizures (HCC)   . Hypokalemia   . Dementia   . Hemiplegia affecting dominant side, late effect of cerebrovascular disease   . Constipation   . Edema   . Diabetes mellitus without complication (HCC)   . Stroke (HCC)   . Hypertension   . CVA (cerebral vascular accident) (HCC) 08/28/2012    No past surgical history on file.  VITAL SIGNS BP 138/80 mmHg  Pulse 80  Ht 6' (1.829 m)  Wt 174 lb (78.926 kg)  BMI 23.59 kg/m2  SpO2 95%  Patient's Medications  New Prescriptions   No medications on file  Previous Medications   ACETAMINOPHEN (TYLENOL) 325 MG TABLET    Take 650 mg by mouth every 4 (four) hours as needed for mild pain or fever.   BACLOFEN (LIORESAL) 10 MG TABLET    Take 10 mg by mouth 2 (two) times daily.    BRINZOLAMIDE-BRIMONIDINE 1-0.2 % SUSP    1 gtt in both eyes BID   CARBAMAZEPINE (CARBATROL) 300 MG 12 HR CAPSULE    Take 1 capsule (300 mg total) by mouth 2 (two) times daily.   CARBAMAZEPINE (TEGRETOL) 200 MG TABLET    Take 200 mg by mouth daily.    CITALOPRAM (CELEXA) 20 MG TABLET    Take 20 mg by mouth daily.   CLOPIDOGREL (PLAVIX) 75 MG TABLET    Take 75 mg by mouth daily.   FOOD THICKENER (THICK IT) POWD    Take 1 Container by mouth as needed. Nectar thick   INSULIN GLARGINE (LANTUS SOLOSTAR) 100 UNIT/ML SOLOSTAR PEN    Inject 10 Units into the skin at bedtime.   LATANOPROST (XALATAN) 0.005 % OPHTHALMIC SOLUTION    Place 1 drop into both eyes at bedtime.   MIRTAZAPINE (REMERON) 15 MG TABLET    Take 0.5 tablets (7.5 mg total) by mouth at bedtime.   MORPHINE (ROXANOL) 20 MG/ML CONCENTRATED SOLUTION    Take 0.3125ml by mouth /under tongue every 4 hours as needed for severe pain   MULTIPLE VITAMIN (MULTIVITAMIN) TABLET    Take 1 tablet by mouth daily.   POLYETHYL GLYCOL-PROPYL GLYCOL (SYSTANE ULTRA) 0.4-0.3 % SOLN    Apply 1 drop to eye 4 (four) times daily. Both eyes   POLYETHYLENE GLYCOL (MIRALAX / GLYCOLAX) PACKET    Take 17 g by mouth daily.   SENNOSIDES-DOCUSATE SODIUM (SENOKOT-S) 8.6-50 MG TABLET    Take 1 tablet by mouth 2 (two) times daily.  Modified Medications   No medications on file  Discontinued Medications   No medications on file     SIGNIFICANT DIAGNOSTIC EXAMS  07-24-14: chest x-ray; right lower lobe atelectasis and mild cardiomegaly; no infiltrate no congestion    LABS REVIEWED:   02-25-14: chol 176; ldl 110; trig 110; hdl 44 40-98-1110-23-15: tsh  0.867; hgb a1c 6.0; tegretol 9.9  05-17-14: wbc 7.1;hgb 11.0; hct 32.1; mcv 98.8; plt 195; glucose 128; bun 13; creat 0.91; k+4.6; na++138; urine micro-albumin <2.0  08-05-14: hgb a1c 6.3 10-07-14; glucose 94; bun 12; creat 1.06; k+ 4.5; na++ 139; liver normal albumin 4.0; tegretol 10.5  12-02-14: wbc 4.2 hgb 10.8; hct 32.3; mcv 96.1; plt 154; chol 181; ldl 119; trig 86; hdl 43; hgb a1c 6.2     Review of Systems Unable to perform ROS: Dementia     Physical Exam Constitutional: He appears well-developed and well-nourished. No distress.  Eyes: Conjunctivae are normal.  Neck: Neck supple. No JVD present. No thyromegaly present.  Cardiovascular: Normal rate, regular rhythm and intact distal pulses.   Respiratory: Effort normal and breath sounds normal. No respiratory distress. He has no wheezes.  GI: Soft. Bowel sounds are normal. He exhibits no distension. There is no tenderness.  Musculoskeletal:  He exhibits no edema.  Has right side hemiparesis Is able to move left extremities    Lymphadenopathy:    He has no cervical adenopathy.  Neurological: He is alert.  Skin: Skin is warm and dry. He is not diaphoretic.  Psychiatric: He has a normal mood and affect.       ASSESSMENT/ PLAN:  1. Seizures: no reports of seizure activity present; will continue tegretol er 300 mg twice daily and 200 mg daily at noon; will monitor his status.   2. Dysphagia: no signs of aspiration present; will continue nectar thick liquids  3. CVA with right hemiplegia: no change in neurological status; will continue baclofen 10 mg twice daily for spasticity will continue plavix 75 mg daily and will continue to monitor his status.   4. Diabetes: is stable hgb a1c is 6.2; will continue lantus 10 units daily and will monitor his status.   5. Depression: is stable does receive benefit from celexa 20 mg daily and is taking remeron 7.5 mg nightly will not make changes will monitor his status.   6. Constipation: will continue miralax daily and senna s twice daily   7. Glaucoma: will continue simbrinz to both eyes twice daily   8. Mixed Alzheimer's and vascular dementia: no change in overall status; is presently not taking medications; will not make changes will monitor his status. His weight is stable at 174  pounds.      Synthia Innocent NP [facility]  Contact [phone] Monday through Friday 8am- 5pm  After hours call [phone]

## 2015-04-13 ENCOUNTER — Encounter: Payer: Self-pay | Admitting: Internal Medicine

## 2015-04-13 ENCOUNTER — Non-Acute Institutional Stay (SKILLED_NURSING_FACILITY): Payer: Medicare Other | Admitting: Internal Medicine

## 2015-04-13 DIAGNOSIS — G309 Alzheimer's disease, unspecified: Secondary | ICD-10-CM

## 2015-04-13 DIAGNOSIS — F329 Major depressive disorder, single episode, unspecified: Secondary | ICD-10-CM

## 2015-04-13 DIAGNOSIS — F0150 Vascular dementia without behavioral disturbance: Secondary | ICD-10-CM | POA: Diagnosis not present

## 2015-04-13 DIAGNOSIS — F0280 Dementia in other diseases classified elsewhere without behavioral disturbance: Secondary | ICD-10-CM | POA: Diagnosis not present

## 2015-04-13 DIAGNOSIS — Z794 Long term (current) use of insulin: Secondary | ICD-10-CM

## 2015-04-13 DIAGNOSIS — K5901 Slow transit constipation: Secondary | ICD-10-CM | POA: Diagnosis not present

## 2015-04-13 DIAGNOSIS — R569 Unspecified convulsions: Secondary | ICD-10-CM

## 2015-04-13 DIAGNOSIS — I6930 Unspecified sequelae of cerebral infarction: Secondary | ICD-10-CM

## 2015-04-13 DIAGNOSIS — R1312 Dysphagia, oropharyngeal phase: Secondary | ICD-10-CM | POA: Diagnosis not present

## 2015-04-13 DIAGNOSIS — E1140 Type 2 diabetes mellitus with diabetic neuropathy, unspecified: Secondary | ICD-10-CM

## 2015-04-13 DIAGNOSIS — E785 Hyperlipidemia, unspecified: Secondary | ICD-10-CM

## 2015-04-13 DIAGNOSIS — F0393 Unspecified dementia, unspecified severity, with mood disturbance: Secondary | ICD-10-CM

## 2015-04-14 ENCOUNTER — Encounter: Payer: Self-pay | Admitting: Internal Medicine

## 2015-04-14 NOTE — Progress Notes (Signed)
Patient ID: Danny Gilbert, male   DOB: 05/26/1924, 81 y.o.   MRN: [id number]    DATE: 04/13/15  Location[facility]  [facility]    Place of Service: [id number] [phone])   Extended Emergency Contact Information Primary Emergency Contact: [facility]ius,[name] Address: [address]          [place], [place] [address] [place] Home Phone: [phone] Work Phone: [phone] Relation: Spouse Secondary Emergency Contact: Ryan,Rondha Address: [address]          [place], [place] [address] [place] Home Phone: [phone] Relation: Daughter  Advanced Directive information  DNR  Chief Complaint  Patient presents with  . Medical Management of Chronic Issues    HPI:  81 yo male long term resident seen today for f/u.  No concerns today. No nursing issues. No falls. He continues to have problems with dysphagia and right hemiparesis. He is a poor historian due to dementia. Hx obtained from chart  Seizures - stable on tegretol er 300 mg twice daily and 200 mg daily at noon. No witnessed sz activity   Dysphagia - due to hx cva.  no signs of aspiration on nectar thick liquids  Hx CVA with right hemiplegia - stable on plavix. takes baclofen 10 mg twice daily for spasticity  DM - controlled on lantus. CBg 105 today. A1c 6.2%   Depression - mood stable on celexa 20 mg daily and remeron 7.5 mg nightly  Constipation - stable on miralax daily and senna s twice daily   Glaucoma - stable on simbrinz to both eyes twice daily    Mixed Alzheimer's and vascular dementia - progressively worsening. Weight down 4 lbs. He does not take any meds  Hyperlipidemia - stable by diet.    Past Medical History  Diagnosis Date  . Hyperlipidemia   . Depression   . Seizures (HCC)   . Hypokalemia   . Dementia   . Hemiplegia affecting dominant side, late effect of cerebrovascular disease   . Constipation   . Edema   . Diabetes mellitus without complication (HCC)   .  Stroke (HCC)   . Hypertension   . CVA (cerebral vascular accident) (HCC) 08/28/2012    No past surgical history on file.  Patient Care Team: Kirt BoysMonica Finnick Orosz, DO as PCP - General (Internal Medicine) Sharee Holstereborah S Green, NP as Nurse Practitioner (Nurse Practitioner)  Social History   Social History  . Marital Status: Married    Spouse Name: N/A  . Number of Children: N/A  . Years of Education: N/A   Occupational History  . Not on file.   Social History Main Topics  . Smoking status: Never Smoker   . Smokeless tobacco: Never Used  . Alcohol Use: No  . Drug Use: No  . Sexual Activity: No   Other Topics Concern  . Not on file   Social History Narrative     reports that he has never smoked. He has never used smokeless tobacco. He reports that he does not drink alcohol or use illicit drugs.  No family history on file. No family status information on file.    Immunization History  Administered Date(s) Administered  . Influenza-Unspecified 03/05/2014, 02/22/2015  . Pneumococcal-Unspecified 03/29/2010, 03/17/2014    No Known Allergies  Medications: Patient's Medications  New Prescriptions   No medications on file  Previous Medications   ACETAMINOPHEN (TYLENOL) 325 MG TABLET    Take 650 mg by mouth every 4 (four) hours  as needed for mild pain or fever.   BACLOFEN (LIORESAL) 10 MG TABLET    Take 10 mg by mouth 2 (two) times daily.    BRINZOLAMIDE-BRIMONIDINE 1-0.2 % SUSP    1 gtt in both eyes BID   CARBAMAZEPINE (CARBATROL) 300 MG 12 HR CAPSULE    Take 1 capsule (300 mg total) by mouth 2 (two) times daily.   CARBAMAZEPINE (TEGRETOL) 200 MG TABLET    Take 200 mg by mouth daily.    CITALOPRAM (CELEXA) 20 MG TABLET    Take 20 mg by mouth daily.   CLOPIDOGREL (PLAVIX) 75 MG TABLET    Take 75 mg by mouth daily.   FOOD THICKENER (THICK IT) POWD    Take 1 Container by mouth as needed. Nectar thick   INSULIN GLARGINE (LANTUS SOLOSTAR) 100 UNIT/ML SOLOSTAR PEN    Inject 10 Units into  the skin at bedtime.   LATANOPROST (XALATAN) 0.005 % OPHTHALMIC SOLUTION    Place 1 drop into both eyes at bedtime.   MIRTAZAPINE (REMERON) 15 MG TABLET    Take 0.5 tablets (7.5 mg total) by mouth at bedtime.   MORPHINE (ROXANOL) 20 MG/ML CONCENTRATED SOLUTION    Take 0.59ml by mouth /under tongue every 4 hours as needed for severe pain   MULTIPLE VITAMIN (MULTIVITAMIN) TABLET    Take 1 tablet by mouth daily.   POLYETHYL GLYCOL-PROPYL GLYCOL (SYSTANE ULTRA) 0.4-0.3 % SOLN    Apply 1 drop to eye 4 (four) times daily. Both eyes   POLYETHYLENE GLYCOL (MIRALAX / GLYCOLAX) PACKET    Take 17 g by mouth daily.   SENNOSIDES-DOCUSATE SODIUM (SENOKOT-S) 8.6-50 MG TABLET    Take 1 tablet by mouth 2 (two) times daily.  Modified Medications   No medications on file  Discontinued Medications   No medications on file    Review of Systems  Unable to perform ROS: Dementia    Filed Vitals:   04/13/15 0930  BP: 130/70  Pulse: 76  Temp: 98 F (36.7 C)  Weight: 170 lb (77.111 kg)  SpO2: 95%   Body mass index is 23.05 kg/(m^2).  Physical Exam  Constitutional: He appears well-developed and well-nourished. No distress.  Sitting in w/c in NAD  HENT:  Mouth/Throat: Oropharynx is clear and moist.  Eyes: Pupils are equal, round, and reactive to light. No scleral icterus.  Neck: Neck supple. Carotid bruit is not present. No thyromegaly present.  Cardiovascular: Normal rate, regular rhythm and intact distal pulses.  Exam reveals no gallop and no friction rub.   Murmur (1/6 holosystolic murmur) heard. Trace LE edema b/l. No calf TTP  Pulmonary/Chest: Effort normal and breath sounds normal. He has no wheezes. He has no rales. He exhibits no tenderness.  Abdominal: Soft. Bowel sounds are normal. He exhibits no distension, no abdominal bruit, no pulsatile midline mass and no mass. There is no tenderness. There is no rebound and no guarding.  Musculoskeletal: He exhibits edema and tenderness.  RUE  contracture- flexion at elbow and fingers  Lymphadenopathy:    He has no cervical adenopathy.  Neurological: He is alert. He exhibits abnormal muscle tone.  Right hemiparesis with muscle atrophy and spasticity noted  Skin: Skin is warm and dry. No rash noted.  Very dry  Psychiatric: He has a normal mood and affect. His behavior is normal.     Labs reviewed: No visits with results within 3 Month(s) from this visit. Latest known visit with results is:  Nursing Home on 10/06/2014  Component Date Value Ref Range Status  . Hgb A1c MFr Bld 07/28/2014 6.3* 4.0 - 6.0 % Final    No results found.   Assessment/Plan    ICD-9-CM ICD-10-CM   1. Dysphagia, oropharyngeal phase 787.22 R13.12   2. Mixed Alzheimer's and vascular dementia 331.0 G30.9    294.10 F01.50    290.40 F02.80   3. History of stroke with residual deficit 438.9 I69.30   4. Depression due to dementia 311 F32.9    294.10 F02.80   5. Controlled type 2 diabetes mellitus with diabetic neuropathy, with long-term current use of insulin (HCC) 250.60 E11.40    357.2 Z79.4    V58.67    6. Seizures (HCC) 780.39 R56.9   7. Slow transit constipation 564.01 K59.01   8. Hyperlipidemia 272.4 E78.5    Pt is medically stable on current tx plan. Continue current medications as ordered. PT/OT/ST as indicated. Will follow  Tyeasha Ebbs S. Ancil Linsey  [facility] [address] [place], [place] [address] [phone] Cell (Monday-Friday 8 AM - 5 PM) [phone] After 5 PM and follow prompts

## 2015-04-14 NOTE — Progress Notes (Signed)
This encounter was created in error - please disregard.

## 2015-04-22 ENCOUNTER — Encounter: Payer: Self-pay | Admitting: Adult Health

## 2015-04-22 MED ORDER — SENNA-DOCUSATE SODIUM 8.6-50 MG PO TABS: 2.0000 | ORAL_TABLET | Freq: Two times a day (BID) | ORAL | Status: AC

## 2015-04-22 NOTE — Progress Notes (Signed)
Patient ID: Danny Gilbert, male   DOB: Dec 21, 1923, 81 y.o.   MRN: [id number]   Facility: [facility]       No Known Allergies  Chief Complaint  Patient presents with  . Medical Management of Chronic Issues    HPI:  He is a long term resident of this facility being seen for the management of his chronic illnesses. His current weight is 172 pounds. He is having a slow decline. He is less engaging to those around him. He is less verbal. He is unable to participate in the hpi or ros. There are no nursing concerns at this time    Past Medical History  Diagnosis Date  . Hyperlipidemia   . Depression   . Seizures (HCC)   . Hypokalemia   . Dementia   . Hemiplegia affecting dominant side, late effect of cerebrovascular disease   . Constipation   . Edema   . Diabetes mellitus without complication (HCC)   . Stroke (HCC)   . Hypertension   . CVA (cerebral vascular accident) (HCC) 08/28/2012    No past surgical history on file.  VITAL SIGNS BP 126/56 mmHg  Pulse 100  Ht 6' (1.829 m)  Wt 172 lb (78.019 kg)  BMI 23.32 kg/m2  Patient's Medications  New Prescriptions   No medications on file  Previous Medications   ACETAMINOPHEN (TYLENOL) 325 MG TABLET    Take 650 mg by mouth every 4 (four) hours as needed for mild pain or fever.   BACLOFEN (LIORESAL) 10 MG TABLET    Take 10 mg by mouth 2 (two) times daily.    BRINZOLAMIDE-BRIMONIDINE 1-0.2 % SUSP    1 gtt in both eyes BID   CARBAMAZEPINE (CARBATROL) 300 MG 12 HR CAPSULE    Take 1 capsule (300 mg total) by mouth 2 (two) times daily.   CARBAMAZEPINE (TEGRETOL) 200 MG TABLET    Take 200 mg by mouth daily.    CITALOPRAM (CELEXA) 20 MG TABLET    Take 20 mg by mouth daily.   CLOPIDOGREL (PLAVIX) 75 MG TABLET    Take 75 mg by mouth daily.   FOOD THICKENER (THICK IT) POWD    Take 1 Container by mouth as needed. Nectar thick   INSULIN GLARGINE (LANTUS SOLOSTAR) 100 UNIT/ML SOLOSTAR PEN    Inject 10 Units into the skin at bedtime.   LATANOPROST  (XALATAN) 0.005 % OPHTHALMIC SOLUTION    Place 1 drop into both eyes at bedtime.   MIRTAZAPINE (REMERON) 15 MG TABLET    Take 0.5 tablets (7.5 mg total) by mouth at bedtime.   MORPHINE (ROXANOL) 20 MG/ML CONCENTRATED SOLUTION    Take 0.55ml by mouth /under tongue every 4 hours as needed for severe pain   MULTIPLE VITAMIN (MULTIVITAMIN) TABLET    Take 1 tablet by mouth daily.   POLYETHYL GLYCOL-PROPYL GLYCOL (SYSTANE ULTRA) 0.4-0.3 % SOLN    Apply 1 drop to eye 4 (four) times daily. Both eyes   POLYETHYLENE GLYCOL (MIRALAX / GLYCOLAX) PACKET    Take 17 g by mouth daily.   SENNOSIDES-DOCUSATE SODIUM (SENOKOT-S) 8.6-50 MG TABLET    Take 1 tablet by mouth 2 (two) times daily.  Modified Medications   No medications on file  Discontinued Medications   No medications on file     SIGNIFICANT DIAGNOSTIC EXAMS   07-24-14: chest x-ray; right lower lobe atelectasis and mild cardiomegaly; no infiltrate no congestion    LABS REVIEWED:   03-20-14: tsh 0.867; hgb a1c 6.0;  tegretol 9.9  05-17-14: wbc 7.1;hgb 11.0; hct 32.1; mcv 98.8; plt 195; glucose 128; bun 13; creat 0.91; k+4.6; na++138; urine micro-albumin <2.0  08-05-14: hgb a1c 6.3 10-07-14; glucose 94; bun 12; creat 1.06; k+ 4.5; na++ 139; liver normal albumin 4.0; tegretol 10.5  12-02-14: wbc 4.2 hgb 10.8; hct 32.3; mcv 96.1; plt 154; chol 181; ldl 119; trig 86; hdl 43; hgb a1c 6.2     Review of Systems Unable to perform ROS: Dementia     Physical Exam Constitutional: He appears well-developed and well-nourished. No distress.  Eyes: Conjunctivae are normal.  Neck: Neck supple. No JVD present. No thyromegaly present.  Cardiovascular: Normal rate, regular rhythm and intact distal pulses.   Respiratory: Effort normal and breath sounds normal. No respiratory distress. He has no wheezes.  GI: Soft. Bowel sounds are normal. He exhibits no distension. There is no tenderness.  Musculoskeletal: He exhibits no edema.  Has right side hemiparesis Is  able to move left extremities    Lymphadenopathy:    He has no cervical adenopathy.  Neurological: He is alert.  Skin: Skin is warm and dry. He is not diaphoretic.  Psychiatric: He has a normal mood and affect.       ASSESSMENT/ PLAN:  1. Seizures: no reports of seizure activity present; will continue tegretol er 300 mg twice daily and 200 mg daily at noon; will monitor his status.   2. Dysphagia: no signs of aspiration present; will continue nectar thick liquids  3. CVA with right hemiplegia: no change in neurological status; will continue baclofen 10 mg twice daily for spasticity will continue plavix 75 mg daily and will continue to monitor his status.   4. Diabetes: is stable hgb a1c is 6.2; will continue lantus 10 units daily and will monitor his status.   5. Depression: is stable does receive benefit from celexa 20 mg daily and is taking remeron 7.5 mg nightly will not make changes will monitor his status.   6. Constipation:   Will stop miralax as he is on thickened liquids.  Will increase senna s to 2 tabs twice daily   7. Glaucoma: will continue simbrinz to both eyes twice daily   8. Mixed Alzheimer's and vascular dementia: he is having a slow progressive decline. He is less engaging to his environment and is not verbal most of the time.  is presently not taking medications; will not make changes will monitor his status. His weight is stable at 172  pounds.      Synthia Innocenteborah Derian Dimalanta NP [facility]  Contact [phone] Monday through Friday 8am- 5pm  After hours call [phone]

## 2015-05-14 ENCOUNTER — Non-Acute Institutional Stay (SKILLED_NURSING_FACILITY): Payer: Medicare Other | Admitting: Adult Health

## 2015-05-14 DIAGNOSIS — R1312 Dysphagia, oropharyngeal phase: Secondary | ICD-10-CM | POA: Diagnosis not present

## 2015-05-14 DIAGNOSIS — G309 Alzheimer's disease, unspecified: Secondary | ICD-10-CM

## 2015-05-14 DIAGNOSIS — F329 Major depressive disorder, single episode, unspecified: Secondary | ICD-10-CM

## 2015-05-14 DIAGNOSIS — R569 Unspecified convulsions: Secondary | ICD-10-CM

## 2015-05-14 DIAGNOSIS — F0393 Unspecified dementia, unspecified severity, with mood disturbance: Secondary | ICD-10-CM

## 2015-05-14 DIAGNOSIS — F0150 Vascular dementia without behavioral disturbance: Secondary | ICD-10-CM | POA: Diagnosis not present

## 2015-05-14 DIAGNOSIS — F0280 Dementia in other diseases classified elsewhere without behavioral disturbance: Secondary | ICD-10-CM | POA: Diagnosis not present

## 2015-05-14 DIAGNOSIS — E1140 Type 2 diabetes mellitus with diabetic neuropathy, unspecified: Secondary | ICD-10-CM

## 2015-05-14 DIAGNOSIS — E785 Hyperlipidemia, unspecified: Secondary | ICD-10-CM | POA: Diagnosis not present

## 2015-05-14 DIAGNOSIS — Z794 Long term (current) use of insulin: Secondary | ICD-10-CM | POA: Diagnosis not present

## 2015-05-14 DIAGNOSIS — I69959 Hemiplegia and hemiparesis following unspecified cerebrovascular disease affecting unspecified side: Secondary | ICD-10-CM

## 2015-05-17 LAB — BASIC METABOLIC PANEL
BUN: 12 mg/dL (ref 4–21)
Creatinine: 0.8 mg/dL (ref 0.6–1.3)
Glucose: 104 mg/dL
Potassium: 4.3 mmol/L (ref 3.4–5.3)
Sodium: 141 mmol/L (ref 137–147)

## 2015-05-17 LAB — CBC AND DIFFERENTIAL
HEMATOCRIT: 30 % — AB (ref 41–53)
Hemoglobin: 10.3 g/dL — AB (ref 13.5–17.5)
PLATELETS: 184 10*3/uL (ref 150–399)
WBC: 5.6 10*3/mL

## 2015-05-29 ENCOUNTER — Encounter: Payer: Self-pay | Admitting: Adult Health

## 2015-05-29 NOTE — Progress Notes (Signed)
Patient ID: Danny Gilbert, male   DOB: 12/06/1923, 81 y.o.   MRN: [id number]    [facility]      No Known Allergies  Chief Complaint  Patient presents with  . Medical Management of Chronic Issues    HPI:  He is a long term resident of this facility being seen for the management of his chronic illnesses. His current weight is 170 pounds. He is unable to participate in the hpi or ros. There are no nursing concerns at this time   Past Medical History  Diagnosis Date  . Hyperlipidemia   . Depression   . Seizures (HCC)   . Hypokalemia   . Dementia   . Hemiplegia affecting dominant side, late effect of cerebrovascular disease   . Constipation   . Edema   . Diabetes mellitus without complication (HCC)   . Stroke (HCC)   . Hypertension   . CVA (cerebral vascular accident) (HCC) 08/28/2012    History reviewed. No pertinent past surgical history.  VITAL SIGNS BP 131/62 mmHg  Pulse 78  Ht 6' (1.829 m)  Wt 170 lb (77.111 kg)  BMI 23.05 kg/m2  SpO2 94%  Patient's Medications  New Prescriptions   No medications on file  Previous Medications   ACETAMINOPHEN (TYLENOL) 325 MG TABLET    Take 650 mg by mouth every 4 (four) hours as needed for mild pain or fever.   BACLOFEN (LIORESAL) 10 MG TABLET    Take 10 mg by mouth 2 (two) times daily.    BRINZOLAMIDE-BRIMONIDINE 1-0.2 % SUSP    1 gtt in both eyes BID   CARBAMAZEPINE (CARBATROL) 300 MG 12 HR CAPSULE    Take 1 capsule (300 mg total) by mouth 2 (two) times daily.   CARBAMAZEPINE (TEGRETOL) 200 MG TABLET    Take 200 mg by mouth daily.    CITALOPRAM (CELEXA) 20 MG TABLET    Take 20 mg by mouth daily.   CLOPIDOGREL (PLAVIX) 75 MG TABLET    Take 75 mg by mouth daily.   FOOD THICKENER (THICK IT) POWD    Take 1 Container by mouth as needed. Nectar thick   INSULIN GLARGINE (LANTUS SOLOSTAR) 100 UNIT/ML SOLOSTAR PEN    Inject 10 Units into the skin at bedtime.   LATANOPROST (XALATAN) 0.005 % OPHTHALMIC SOLUTION    Place 1  drop into both eyes at bedtime.   MIRTAZAPINE (REMERON) 15 MG TABLET    Take 0.5 tablets (7.5 mg total) by mouth at bedtime.   MORPHINE (ROXANOL) 20 MG/ML CONCENTRATED SOLUTION    Take 0.5025ml by mouth /under tongue every 4 hours as needed for severe pain   MULTIPLE VITAMIN (MULTIVITAMIN) TABLET    Take 1 tablet by mouth daily.   POLYETHYL GLYCOL-PROPYL GLYCOL (SYSTANE ULTRA) 0.4-0.3 % SOLN    Apply 1 drop to eye 4 (four) times daily. Both eyes   SENNOSIDES-DOCUSATE SODIUM (SENOKOT-S) 8.6-50 MG TABLET    Take 2 tablets by mouth 2 (two) times daily.  Modified Medications   No medications on file  Discontinued Medications   No medications on file     SIGNIFICANT DIAGNOSTIC EXAMS   07-24-14: chest x-ray; right lower lobe atelectasis and mild cardiomegaly; no infiltrate no congestion    LABS REVIEWED:   05-17-14: wbc 7.1;hgb 11.0; hct 32.1; mcv 98.8; plt 195; glucose 128; bun 13; creat 0.91; k+4.6; na++138; urine micro-albumin <2.0  08-05-14: hgb a1c 6.3 10-07-14; glucose 94; bun 12; creat 1.06; k+ 4.5; na++ 139;  liver normal albumin 4.0; tegretol 10.5  12-02-14: wbc 4.2 hgb 10.8; hct 32.3; mcv 96.1; plt 154; chol 181; ldl 119; trig 86; hdl 43; hgb a1c 6.2     Review of Systems Unable to perform ROS: Dementia     Physical Exam Constitutional: He appears well-developed and well-nourished. No distress.  Eyes: Conjunctivae are normal.  Neck: Neck supple. No JVD present. No thyromegaly present.  Cardiovascular: Normal rate, regular rhythm and intact distal pulses.   Respiratory: Effort normal and breath sounds normal. No respiratory distress. He has no wheezes.  GI: Soft. Bowel sounds are normal. He exhibits no distension. There is no tenderness.  Musculoskeletal: He exhibits no edema.  Has right side hemiparesis Is able to move left extremities    Lymphadenopathy:    He has no cervical adenopathy.  Neurological: He is alert.  Skin: Skin is warm and dry. He is not diaphoretic.    Psychiatric: He has a normal mood and affect.       ASSESSMENT/ PLAN:  1. Seizures: no reports of seizure activity present; will continue tegretol er 300 mg twice daily and 200 mg daily at noon; will monitor his status.   2. Dysphagia: no signs of aspiration present; will continue nectar thick liquids  3. CVA with right hemiplegia: no change in neurological status; will continue baclofen 10 mg twice daily for spasticity will continue plavix 75 mg daily and will continue to monitor his status.   4. Diabetes: is stable hgb a1c is 6.2; will continue lantus 10 units daily and will monitor his status.   5. Depression: is stable does receive benefit from celexa 20 mg daily and is taking remeron 7.5 mg nightly will not make changes will monitor his status.   6. Constipation:   Will stop miralax as he is on thickened liquids.  Will increase senna s to 2 tabs twice daily   7. Glaucoma: will continue simbrinz to both eyes twice daily   8. Mixed Alzheimer's and vascular dementia: he is having a slow progressive decline. He is less engaging to his environment and is not verbal most of the time.  is presently not taking medications; will not make changes will monitor his status. His weight is stable at 170 pounds.     Will check cbc; cmp; hgb a1c tegretol lipids       Synthia Innocent NP [facility]  Contact [phone] Monday through Friday 8am- 5pm  After hours call [phone]

## 2015-07-09 ENCOUNTER — Non-Acute Institutional Stay (SKILLED_NURSING_FACILITY): Payer: Medicare Other | Admitting: Adult Health

## 2015-07-09 DIAGNOSIS — F0150 Vascular dementia without behavioral disturbance: Secondary | ICD-10-CM | POA: Diagnosis not present

## 2015-07-09 DIAGNOSIS — R1312 Dysphagia, oropharyngeal phase: Secondary | ICD-10-CM

## 2015-07-09 DIAGNOSIS — F0280 Dementia in other diseases classified elsewhere without behavioral disturbance: Secondary | ICD-10-CM

## 2015-07-09 DIAGNOSIS — R569 Unspecified convulsions: Secondary | ICD-10-CM | POA: Diagnosis not present

## 2015-07-09 DIAGNOSIS — K5901 Slow transit constipation: Secondary | ICD-10-CM

## 2015-07-09 DIAGNOSIS — I69959 Hemiplegia and hemiparesis following unspecified cerebrovascular disease affecting unspecified side: Secondary | ICD-10-CM | POA: Diagnosis not present

## 2015-07-09 DIAGNOSIS — G309 Alzheimer's disease, unspecified: Secondary | ICD-10-CM | POA: Diagnosis not present

## 2015-08-09 ENCOUNTER — Non-Acute Institutional Stay (SKILLED_NURSING_FACILITY): Payer: Medicare Other | Admitting: Adult Health

## 2015-08-09 ENCOUNTER — Encounter: Payer: Self-pay | Admitting: Adult Health

## 2015-08-09 DIAGNOSIS — R1312 Dysphagia, oropharyngeal phase: Secondary | ICD-10-CM

## 2015-08-09 DIAGNOSIS — F0150 Vascular dementia without behavioral disturbance: Secondary | ICD-10-CM | POA: Diagnosis not present

## 2015-08-09 DIAGNOSIS — R634 Abnormal weight loss: Secondary | ICD-10-CM | POA: Diagnosis not present

## 2015-08-09 DIAGNOSIS — G309 Alzheimer's disease, unspecified: Secondary | ICD-10-CM | POA: Diagnosis not present

## 2015-08-09 DIAGNOSIS — I69959 Hemiplegia and hemiparesis following unspecified cerebrovascular disease affecting unspecified side: Secondary | ICD-10-CM | POA: Diagnosis not present

## 2015-08-09 DIAGNOSIS — R569 Unspecified convulsions: Secondary | ICD-10-CM | POA: Diagnosis not present

## 2015-08-09 DIAGNOSIS — Z794 Long term (current) use of insulin: Secondary | ICD-10-CM | POA: Diagnosis not present

## 2015-08-09 DIAGNOSIS — F0280 Dementia in other diseases classified elsewhere without behavioral disturbance: Secondary | ICD-10-CM | POA: Diagnosis not present

## 2015-08-09 DIAGNOSIS — E1140 Type 2 diabetes mellitus with diabetic neuropathy, unspecified: Secondary | ICD-10-CM | POA: Diagnosis not present

## 2015-08-09 LAB — CARBAMAZEPINE LEVEL, TOTAL: CARBAMAZEPINE: 7.5

## 2015-08-09 NOTE — Progress Notes (Signed)
Patient ID: Danny Gilbert, male   DOB: 11-10-1923, 82 y.o.   MRN: [id number]   [facility]       No Known Allergies   Chief Complaint  Patient presents with  . Annual Exam     HPI:  He is a long term resident of this facility being seen for his annual exam. He has declined over the past year with weight loss and being less engaging in his surroundings. His weight in Sept 2016 was 172 pounds his current weight is 151 pounds.    Past Medical History  Diagnosis Date  . Hyperlipidemia   . Depression   . Seizures (HCC)   . Hypokalemia   . Dementia   . Hemiplegia affecting dominant side, late effect of cerebrovascular disease   . Constipation   . Edema   . Diabetes mellitus without complication (HCC)   . Stroke (HCC)   . Hypertension   . CVA (cerebral vascular accident) (HCC) 08/28/2012    History reviewed. No pertinent past surgical history.  VITAL SIGNS BP 120/72 mmHg  Pulse 70  Temp(Src) 98 F (36.7 C) (Oral)  Resp 18  Ht  (1.778 m)  Wt 151 lb (68.493 kg)  BMI 21.67 kg/m2  SpO2 94%  Patient's Medications  New Prescriptions   No medications on file  Previous Medications   ACETAMINOPHEN (TYLENOL) 325 MG TABLET    Take 650 mg by mouth every 4 (four) hours as needed for mild pain or fever.   BACLOFEN (LIORESAL) 10 MG TABLET    Take 10 mg by mouth 2 (two) times daily.    BRINZOLAMIDE-BRIMONIDINE 1-0.2 % SUSP    1 gtt in both eyes BID   CARBAMAZEPINE (CARBATROL) 300 MG 12 HR CAPSULE    Take 1 capsule (300 mg total) by mouth 2 (two) times daily.   CARBAMAZEPINE (TEGRETOL) 200 MG TABLET    Take 200 mg by mouth daily.    CITALOPRAM (CELEXA) 20 MG TABLET    Take 20 mg by mouth daily.   CLOPIDOGREL (PLAVIX) 75 MG TABLET    Take 75 mg by mouth daily.   FOOD THICKENER (THICK IT) POWD    Take 1 Container by mouth as needed. Nectar thick   GUAIFENESIN (ROBITUSSIN) 100 MG/5ML SYRUP    Take 10 mLs by mouth every 6 (six) hours as needed for cough.   INSULIN  GLARGINE (LANTUS SOLOSTAR) 100 UNIT/ML SOLOSTAR PEN    Inject 5 Units into the skin at bedtime.    LATANOPROST (XALATAN) 0.005 % OPHTHALMIC SOLUTION    Place 1 drop into both eyes at bedtime.   MENTHOL (CEPACOL SORE THROAT) 5.4 MG LOZG    Use as directed 1 lozenge in the mouth or throat every 8 (eight) hours as needed.   MIRTAZAPINE (REMERON) 15 MG TABLET    Take 0.5 tablets (7.5 mg total) by mouth at bedtime.   MULTIPLE VITAMIN (MULTIVITAMIN) TABLET    Take 1 tablet by mouth daily.   OSELTAMIVIR (TAMIFLU) 75 MG CAPSULE    Take 75 mg by mouth daily. X 14 days   OXYCODONE (OXY IR/ROXICODONE) 5 MG IMMEDIATE RELEASE TABLET    Take 5 mg by mouth every 8 (eight) hours as needed for severe pain.   POLYETHYL GLYCOL-PROPYL GLYCOL (SYSTANE ULTRA) 0.4-0.3 % SOLN    Apply 1 drop to eye 4 (four) times daily. Both eyes   SENNOSIDES-DOCUSATE SODIUM (SENOKOT-S) 8.6-50 MG TABLET    Take 2 tablets by mouth 2 (  two) times daily.  Modified Medications   No medications on file  Discontinued Medications     SIGNIFICANT DIAGNOSTIC EXAMS  07-24-14: chest x-ray; right lower lobe atelectasis and mild cardiomegaly; no infiltrate no congestion    LABS REVIEWED:   10-07-14; glucose 94; bun 12; creat 1.06; k+ 4.5; na++ 139; liver normal albumin 4.0; tegretol 10.5  12-02-14: wbc 4.2 hgb 10.8; hct 32.3; mcv 96.1; plt 154; chol 181; ldl 119; trig 86; hdl 43; hgb a1c 6.2   05-17-15: wbc 5.6; hgb 10.3; hct 29.8; mcv 96.8; plt 184; glucose 104; bun 12; creat 0.78; k+ 4.3; na++141; liver normal albumin 3.0; cho 142; ldl 89; trig 83; hdl 36; tsh 1.036; hgb a1c 6.3; tegretol 7.5    Review of Systems Unable to perform ROS: Dementia     Physical Exam Constitutional: He appears well-developed and well-nourished. No distress.  Eyes: Conjunctivae are normal.  Neck: Neck supple. No JVD present. No thyromegaly present.  Cardiovascular: Normal rate, regular rhythm and intact distal pulses.   Respiratory: Effort normal and breath  sounds normal. No respiratory distress. He has no wheezes.  GI: Soft. Bowel sounds are normal. He exhibits no distension. There is no tenderness.  Musculoskeletal: He exhibits no edema.  Has right side hemiparesis Is able to move left extremities    Lymphadenopathy:    He has no cervical adenopathy.  Neurological: He is alert.  Skin: Skin is warm and dry. He is not diaphoretic.  Psychiatric: He has a normal mood and affect.       ASSESSMENT/ PLAN:  1. Seizures: no reports of seizure activity present; will continue tegretol er 300 mg twice daily and 200 mg daily at noon; will monitor his status.   2. Dysphagia: no signs of aspiration present; will continue nectar thick liquids  3. CVA with right hemiplegia: no change in neurological status; will continue baclofen 10 mg twice daily for spasticity will continue plavix 75 mg daily and will continue to monitor his status.   4. Diabetes: is stable hgb a1c is 6.3; will continue lantus 10 units daily and will monitor his status.   5. Depression: is stable does receive benefit from celexa 20 mg daily and is taking remeron 7.5 mg nightly will not make changes will monitor his status.   6. Constipation:   Will continue senna s  2 tabs twice daily   7. Glaucoma: will continue simbrinz to both eyes twice daily   8. Mixed Alzheimer's and vascular dementia: he is having a slow progressive decline. He is less engaging to his environment and is not verbal most of the time.  is presently not taking medications; will not make changes will monitor his status. He weight is 151 pounds   9. Weight loss: will lower his remeron to 7.5 mg every other night for 5 doses then stop as this medications is not helping with his appetite. Will begin marinol 2.5 mg daily prior to lunch for 30 days to help with appetite.     Will get urine for micro albumin    His health maintenance is to date   Time spent with patient 40   minutes >50% time spent counseling;  reviewing medical record; tests; labs; and developing future plan of care      Synthia InnocentDeborah Deandrea Rion NP [facility]  Contact [phone] Monday through Friday 8am- 5pm  After hours call [phone]

## 2015-08-11 ENCOUNTER — Encounter: Payer: Self-pay | Admitting: Adult Health

## 2015-08-11 DIAGNOSIS — R634 Abnormal weight loss: Secondary | ICD-10-CM | POA: Insufficient documentation

## 2015-08-11 LAB — HEMOGLOBIN A1C: HEMOGLOBIN A1C: 6

## 2015-08-11 LAB — BASIC METABOLIC PANEL: GLUCOSE: 126 mg/dL

## 2015-08-11 NOTE — Progress Notes (Signed)
Patient ID: Danny Gilbert, male   DOB: 11-24-1923, 82 y.o.   MRN: [id number]   [facility]       No Known Allergies  Chief Complaint  Patient presents with  . Medical Management of Chronic Issues    HPI:  He is a long term resident of this facility being seen for the management of his chronic illnesses. He is slowly declining over time. He is less alert and is unable to participate in the hpi or ros; and he is losing weight. There are no nursing concerns at this time.   Past Medical History  Diagnosis Date  . Hyperlipidemia   . Depression   . Seizures (HCC)   . Hypokalemia   . Dementia   . Hemiplegia affecting dominant side, late effect of cerebrovascular disease   . Constipation   . Edema   . Diabetes mellitus without complication (HCC)   . Stroke (HCC)   . Hypertension   . CVA (cerebral vascular accident) (HCC) 08/28/2012    No past surgical history on file.  VITAL SIGNS BP 110/70 mmHg  Pulse 68  Ht  (1.778 m)  Wt 151 lb (68.493 kg)  BMI 21.67 kg/m2  SpO2 96%  Patient's Medications  New Prescriptions   No medications on file  Previous Medications   ACETAMINOPHEN (TYLENOL) 325 MG TABLET    Take 650 mg by mouth every 4 (four) hours as needed for mild pain or fever.   BACLOFEN (LIORESAL) 10 MG TABLET    Take 10 mg by mouth 2 (two) times daily.    BRINZOLAMIDE-BRIMONIDINE 1-0.2 % SUSP    1 gtt in both eyes BID   CARBAMAZEPINE (CARBATROL) 300 MG 12 HR CAPSULE    Take 1 capsule (300 mg total) by mouth 2 (two) times daily.   CARBAMAZEPINE (TEGRETOL) 200 MG TABLET    Take 200 mg by mouth daily.    CITALOPRAM (CELEXA) 20 MG TABLET    Take 20 mg by mouth daily.   CLOPIDOGREL (PLAVIX) 75 MG TABLET    Take 75 mg by mouth daily.   FOOD THICKENER (THICK IT) POWD    Take 1 Container by mouth as needed. Nectar thick   GUAIFENESIN (ROBITUSSIN) 100 MG/5ML SYRUP    Take 10 mLs by mouth every 6 (six) hours as needed for cough.   INSULIN GLARGINE (LANTUS SOLOSTAR)  100 UNIT/ML SOLOSTAR PEN    Inject 5 Units into the skin at bedtime.    LATANOPROST (XALATAN) 0.005 % OPHTHALMIC SOLUTION    Place 1 drop into both eyes at bedtime.   MENTHOL (CEPACOL SORE THROAT) 5.4 MG LOZG    Use as directed 1 lozenge in the mouth or throat every 8 (eight) hours as needed.   MIRTAZAPINE (REMERON) 15 MG TABLET    Take 0.5 tablets (7.5 mg total) by mouth at bedtime.   MULTIPLE VITAMIN (MULTIVITAMIN) TABLET    Take 1 tablet by mouth daily.   OSELTAMIVIR (TAMIFLU) 75 MG CAPSULE    Take 75 mg by mouth daily. X 14 days   OXYCODONE (OXY IR/ROXICODONE) 5 MG IMMEDIATE RELEASE TABLET    Take 5 mg by mouth every 8 (eight) hours as needed for severe pain.   POLYETHYL GLYCOL-PROPYL GLYCOL (SYSTANE ULTRA) 0.4-0.3 % SOLN    Apply 1 drop to eye 4 (four) times daily. Both eyes   SENNOSIDES-DOCUSATE SODIUM (SENOKOT-S) 8.6-50 MG TABLET    Take 2 tablets by mouth 2 (two) times daily.  Modified Medications  No medications on file  Discontinued Medications   No medications on file     SIGNIFICANT DIAGNOSTIC EXAMS   07-24-14: chest x-ray; right lower lobe atelectasis and mild cardiomegaly; no infiltrate no congestion    LABS REVIEWED:   08-05-14: hgb a1c 6.3 10-07-14; glucose 94; bun 12; creat 1.06; k+ 4.5; na++ 139; liver normal albumin 4.0; tegretol 10.5  12-02-14: wbc 4.2 hgb 10.8; hct 32.3; mcv 96.1; plt 154; chol 181; ldl 119; trig 86; hdl 43; hgb a1c 6.2   05-16-16: wbc 5.6; hgb 10.3; hct 29.8; mcv 96.8; plt 184; glucose 104; bun 12; creat 0.78; k+ 4.3; na++141; liver normal albumin 3.0; chol 142; ldl 89; trig 83; hdl 36; tsh 1.036; hgb a1c 6.3; tegretol 7.5    Review of Systems Unable to perform ROS: Dementia     Physical Exam Constitutional: He appears well-developed and well-nourished. No distress.  Eyes: Conjunctivae are normal.  Neck: Neck supple. No JVD present. No thyromegaly present.  Cardiovascular: Normal rate, regular rhythm and intact distal pulses.   Respiratory:  Effort normal and breath sounds normal. No respiratory distress. He has no wheezes.  GI: Soft. Bowel sounds are normal. He exhibits no distension. There is no tenderness.  Musculoskeletal: He exhibits no edema.  Has right side hemiparesis Is able to move left extremities    Lymphadenopathy:    He has no cervical adenopathy.  Neurological: He is alert.  Skin: Skin is warm and dry. He is not diaphoretic.  Psychiatric: He has a normal mood and affect.       ASSESSMENT/ PLAN:  1. Seizures: no reports of seizure activity present; will continue tegretol er 300 mg twice daily and 200 mg daily at noon; will monitor his status.   2. Dysphagia: no signs of aspiration present; will continue nectar thick liquids  3. CVA with right hemiplegia: no change in neurological status; will continue baclofen 10 mg twice daily for spasticity will continue plavix 75 mg daily and will continue to monitor his status.   4. Diabetes: is stable hgb a1c is 6.3; will continue lantus 10 units daily and will monitor his status.   5. Depression: is stable does receive benefit from celexa 20 mg daily and is taking remeron 7.5 mg nightly will not make changes will monitor his status.   6. Constipation:   Will continue  senna s  2 tabs twice daily   7. Glaucoma: will continue simbrinz to both eyes twice daily   8. Mixed Alzheimer's and vascular dementia: he is having a slow progressive decline. He is less engaging to his environment and is not verbal most of the time.  is presently not taking medications; will not make changes will monitor his status. He is losing weight; this can be expected in the late stage of this disease process        Danny Innocenteborah Tezra Mahr NP [facility]  Contact [phone] Monday through Friday 8am- 5pm  After hours call [phone]

## 2015-09-06 ENCOUNTER — Non-Acute Institutional Stay (SKILLED_NURSING_FACILITY): Payer: Medicare Other | Admitting: Adult Health

## 2015-09-06 ENCOUNTER — Encounter: Payer: Self-pay | Admitting: Adult Health

## 2015-09-06 DIAGNOSIS — E785 Hyperlipidemia, unspecified: Secondary | ICD-10-CM

## 2015-09-06 DIAGNOSIS — G309 Alzheimer's disease, unspecified: Secondary | ICD-10-CM | POA: Diagnosis not present

## 2015-09-06 DIAGNOSIS — R1312 Dysphagia, oropharyngeal phase: Secondary | ICD-10-CM

## 2015-09-06 DIAGNOSIS — F0393 Unspecified dementia, unspecified severity, with mood disturbance: Secondary | ICD-10-CM

## 2015-09-06 DIAGNOSIS — F0150 Vascular dementia without behavioral disturbance: Secondary | ICD-10-CM | POA: Diagnosis not present

## 2015-09-06 DIAGNOSIS — E1140 Type 2 diabetes mellitus with diabetic neuropathy, unspecified: Secondary | ICD-10-CM | POA: Diagnosis not present

## 2015-09-06 DIAGNOSIS — I69959 Hemiplegia and hemiparesis following unspecified cerebrovascular disease affecting unspecified side: Secondary | ICD-10-CM | POA: Diagnosis not present

## 2015-09-06 DIAGNOSIS — Z794 Long term (current) use of insulin: Secondary | ICD-10-CM | POA: Diagnosis not present

## 2015-09-06 DIAGNOSIS — R569 Unspecified convulsions: Secondary | ICD-10-CM

## 2015-09-06 DIAGNOSIS — F0280 Dementia in other diseases classified elsewhere without behavioral disturbance: Secondary | ICD-10-CM | POA: Diagnosis not present

## 2015-09-06 DIAGNOSIS — F329 Major depressive disorder, single episode, unspecified: Secondary | ICD-10-CM | POA: Diagnosis not present

## 2015-09-06 LAB — CARBAMAZEPINE LEVEL, TOTAL: CARBAMAZEPINE, TOTAL: 7.6

## 2015-09-06 NOTE — Progress Notes (Signed)
Patient ID: Danny Gilbert, male   DOB: 01/14/1924, 82 y.o.   MRN: [id number]   [facility]       No Known Allergies  Chief Complaint  Patient presents with  . Medical Management of Chronic Issues    Follow up    HPI:  He is a long term resident of this facility being seen for the management of his chronic illnesses. His current weight is stable at 152 pounds. He is unable to participate in the hpi or ros. There are no nursing concerns at this time.   Past Medical History  Diagnosis Date  . Hyperlipidemia   . Depression   . Seizures (HCC)   . Hypokalemia   . Dementia   . Hemiplegia affecting dominant side, late effect of cerebrovascular disease   . Constipation   . Edema   . Diabetes mellitus without complication (HCC)   . Stroke (HCC)   . Hypertension   . CVA (cerebral vascular accident) (HCC) 08/28/2012    History reviewed. No pertinent past surgical history.  VITAL SIGNS BP 130/66 mmHg  Pulse 79  Temp(Src) 97.9 F (36.6 C) (Oral)  Resp 16  Ht  (1.778 m)  Wt 152 lb (68.947 kg)  BMI 21.81 kg/m2  SpO2 94%  Patient's Medications  New Prescriptions   No medications on file  Previous Medications   ACETAMINOPHEN (TYLENOL) 325 MG TABLET    Take 650 mg by mouth every 4 (four) hours as needed for mild pain or fever.   BACLOFEN (LIORESAL) 10 MG TABLET    Take 10 mg by mouth 2 (two) times daily.    BRINZOLAMIDE-BRIMONIDINE 1-0.2 % SUSP    1 gtt in both eyes BID   CARBAMAZEPINE (CARBATROL) 300 MG 12 HR CAPSULE    Take 1 capsule (300 mg total) by mouth 2 (two) times daily.   CARBAMAZEPINE (TEGRETOL) 200 MG TABLET    Take 200 mg by mouth daily.    CITALOPRAM (CELEXA) 20 MG TABLET    Take 20 mg by mouth daily.   CLOPIDOGREL (PLAVIX) 75 MG TABLET    Take 75 mg by mouth daily.   FOOD THICKENER (THICK IT) POWD    Take 1 Container by mouth as needed. Nectar thick   GUAIFENESIN (ROBITUSSIN) 100 MG/5ML SYRUP    Take 10 mLs by mouth every 6 (six) hours as needed  for cough.   INSULIN GLARGINE (LANTUS SOLOSTAR) 100 UNIT/ML SOLOSTAR PEN    Inject 5 Units into the skin at bedtime.    LATANOPROST (XALATAN) 0.005 % OPHTHALMIC SOLUTION    Place 1 drop into both eyes at bedtime.   MAGNESIUM HYDROXIDE (MILK OF MAGNESIA) 400 MG/5ML SUSPENSION    Take 30 mLs by mouth daily as needed for mild constipation.   MENTHOL (CEPACOL SORE THROAT) 5.4 MG LOZG    Use as directed 1 lozenge in the mouth or throat every 8 (eight) hours as needed.   MULTIPLE VITAMIN (MULTIVITAMIN) TABLET    Take 1 tablet by mouth daily.   OXYCODONE (OXY IR/ROXICODONE) 5 MG IMMEDIATE RELEASE TABLET    Take 5 mg by mouth every 8 (eight) hours as needed for severe pain.   POLYETHYL GLYCOL-PROPYL GLYCOL (SYSTANE ULTRA) 0.4-0.3 % SOLN    Apply 1 drop to eye 4 (four) times daily. Both eyes   SENNOSIDES-DOCUSATE SODIUM (SENOKOT-S) 8.6-50 MG TABLET    Take 2 tablets by mouth 2 (two) times daily.  Modified Medications   No medications on file  Discontinued Medications     SIGNIFICANT DIAGNOSTIC EXAMS  07-24-14: chest x-ray; right lower lobe atelectasis and mild cardiomegaly; no infiltrate no congestion    LABS REVIEWED:   10-07-14; glucose 94; bun 12; creat 1.06; k+ 4.5; na++ 139; liver normal albumin 4.0; tegretol 10.5  12-02-14: wbc 4.2 hgb 10.8; hct 32.3; mcv 96.1; plt 154; chol 181; ldl 119; trig 86; hdl 43; hgb a1c 6.2   05-17-15: wbc 5.6; hgb 10.3; hct 29.8; mcv 96.8; plt 184; glucose 104; bun 12; creat 0.78; k+ 4.3; na++141; liver normal albumin 3.0; cho 142; ldl 89; trig 83; hdl 36; tsh 1.036; hgb a1c 6.3; tegretol 7.5  08-11-15: hgb a1c  6.0; tegretol 7.6    Review of Systems Unable to perform ROS: Dementia     Physical Exam Constitutional: He appears well-developed and well-nourished. No distress.  Eyes: Conjunctivae are normal.  Neck: Neck supple. No JVD present. No thyromegaly present.  Cardiovascular: Normal rate, regular rhythm and intact distal pulses.   Respiratory: Effort  normal and breath sounds normal. No respiratory distress. He has no wheezes.  GI: Soft. Bowel sounds are normal. He exhibits no distension. There is no tenderness.  Musculoskeletal: He exhibits no edema.  Has right side hemiparesis Is able to move left extremities    Lymphadenopathy:    He has no cervical adenopathy.  Neurological: He is alert.  Skin: Skin is warm and dry. He is not diaphoretic.  Psychiatric: He has a normal mood and affect.       ASSESSMENT/ PLAN:  1. Seizures: no reports of seizure activity present; will continue tegretol er 300 mg twice daily and 200 mg daily at noon; will monitor his status.   2. Dysphagia: no signs of aspiration present; will continue nectar thick liquids  3. CVA with right hemiplegia: no change in neurological status; will continue baclofen 10 mg twice daily for spasticity will continue plavix 75 mg daily and will continue to monitor his status.   4. Diabetes: is stable hgb a1c is 6.0; will continue lantus 10 units daily and will monitor his status.   5. Depression: is stable does receive benefit from celexa 20 mg daily  will monitor his status.   6. Constipation:   Will continue senna s  2 tabs twice daily   7. Glaucoma: will continue simbrinz to both eyes twice daily   8. Mixed Alzheimer's and vascular dementia: he is having a slow progressive decline. He is less engaging to his environment and is not verbal most of the time.  is presently not taking medications; will not make changes will monitor his status. He weight is 152 pounds   9. Weight loss: his weight has been stable at 152 pounds; will not make changes and will monitor his status. Will continue supplements per facility protocol.       Synthia Innocenteborah Green NP [facility]  Contact [phone] Monday through Friday 8am- 5pm  After hours call [phone]

## 2015-09-30 ENCOUNTER — Non-Acute Institutional Stay (SKILLED_NURSING_FACILITY): Payer: Medicare Other | Admitting: Adult Health

## 2015-09-30 ENCOUNTER — Encounter: Payer: Self-pay | Admitting: Adult Health

## 2015-09-30 DIAGNOSIS — F0150 Vascular dementia without behavioral disturbance: Secondary | ICD-10-CM | POA: Diagnosis not present

## 2015-09-30 DIAGNOSIS — R569 Unspecified convulsions: Secondary | ICD-10-CM

## 2015-09-30 DIAGNOSIS — R1312 Dysphagia, oropharyngeal phase: Secondary | ICD-10-CM

## 2015-09-30 DIAGNOSIS — F0280 Dementia in other diseases classified elsewhere without behavioral disturbance: Secondary | ICD-10-CM | POA: Diagnosis not present

## 2015-09-30 DIAGNOSIS — F329 Major depressive disorder, single episode, unspecified: Secondary | ICD-10-CM | POA: Diagnosis not present

## 2015-09-30 DIAGNOSIS — K5901 Slow transit constipation: Secondary | ICD-10-CM

## 2015-09-30 DIAGNOSIS — I69959 Hemiplegia and hemiparesis following unspecified cerebrovascular disease affecting unspecified side: Secondary | ICD-10-CM | POA: Diagnosis not present

## 2015-09-30 DIAGNOSIS — R634 Abnormal weight loss: Secondary | ICD-10-CM | POA: Diagnosis not present

## 2015-09-30 DIAGNOSIS — G309 Alzheimer's disease, unspecified: Secondary | ICD-10-CM | POA: Diagnosis not present

## 2015-09-30 DIAGNOSIS — F0393 Unspecified dementia, unspecified severity, with mood disturbance: Secondary | ICD-10-CM

## 2015-09-30 DIAGNOSIS — Z794 Long term (current) use of insulin: Secondary | ICD-10-CM | POA: Diagnosis not present

## 2015-09-30 DIAGNOSIS — E1140 Type 2 diabetes mellitus with diabetic neuropathy, unspecified: Secondary | ICD-10-CM | POA: Diagnosis not present

## 2015-09-30 NOTE — Progress Notes (Signed)
Patient ID: Danny Gilbert, male   DOB: 1923-10-12, 82 y.o.   MRN: [id number]   [facility]     CODE STATUS: DNR  No Known Allergies  Chief Complaint  Patient presents with  . Medical Management of Chronic Issues    Follow up    HPI:  He is a long term resident of this facility being seen for the management of his chronic illnesses. Overall there is little change in his status. He did have a 3 pound weight gain from his marinol therapy; he has completed the 30 day treatment. He is unable to participate in the hpi or ros. There are no nursing concerns at this time.    Past Medical History  Diagnosis Date  . Hyperlipidemia   . Depression   . Seizures (HCC)   . Hypokalemia   . Dementia   . Hemiplegia affecting dominant side, late effect of cerebrovascular disease   . Constipation   . Edema   . Diabetes mellitus without complication (HCC)   . Stroke (HCC)   . Hypertension   . CVA (cerebral vascular accident) (HCC) 08/28/2012    History reviewed. No pertinent past surgical history.  Social History   Social History  . Marital Status: Married    Spouse Name: N/A  . Number of Children: N/A  . Years of Education: N/A   Occupational History  . Not on file.   Social History Main Topics  . Smoking status: Never Smoker   . Smokeless tobacco: Never Used  . Alcohol Use: No  . Drug Use: No  . Sexual Activity: No   Other Topics Concern  . Not on file   Social History Narrative   History reviewed. No pertinent family history.    VITAL SIGNS BP 156/66 mmHg  Pulse 68  Temp(Src) 98.3 F (36.8 C) (Oral)  Resp 20  Ht  (1.778 m)  Wt 155 lb (70.308 kg)  BMI 22.24 kg/m2  SpO2 97%  Patient's Medications  New Prescriptions   No medications on file  Previous Medications   ACETAMINOPHEN (TYLENOL) 325 MG TABLET    Take 650 mg by mouth every 4 (four) hours as needed for mild pain or fever.   BACLOFEN (LIORESAL) 10 MG TABLET    Take 10 mg by mouth 2 (two)  times daily.    BRINZOLAMIDE-BRIMONIDINE 1-0.2 % SUSP    1 gtt in both eyes BID   CARBAMAZEPINE (CARBATROL) 300 MG 12 HR CAPSULE    Take 1 capsule (300 mg total) by mouth 2 (two) times daily.   CARBAMAZEPINE (TEGRETOL) 200 MG TABLET    Take 200 mg by mouth daily.    CITALOPRAM (CELEXA) 20 MG TABLET    Take 20 mg by mouth daily.   CLOPIDOGREL (PLAVIX) 75 MG TABLET    Take 75 mg by mouth daily.   FOOD THICKENER (THICK IT) POWD    Take 1 Container by mouth as needed. Nectar thick   GUAIFENESIN (ROBITUSSIN) 100 MG/5ML SYRUP    Take 10 mLs by mouth every 6 (six) hours as needed for cough.   INSULIN GLARGINE (LANTUS SOLOSTAR) 100 UNIT/ML SOLOSTAR PEN    Inject 5 Units into the skin at bedtime.    LATANOPROST (XALATAN) 0.005 % OPHTHALMIC SOLUTION    Place 1 drop into both eyes at bedtime.   MAGNESIUM HYDROXIDE (MILK OF MAGNESIA) 400 MG/5ML SUSPENSION    Take 30 mLs by mouth daily as needed for mild constipation.   MENTHOL (  CEPACOL SORE THROAT) 5.4 MG LOZG    Use as directed 1 lozenge in the mouth or throat every 8 (eight) hours as needed.   MULTIPLE VITAMIN (MULTIVITAMIN) TABLET    Take 1 tablet by mouth daily.   OXYCODONE (OXY IR/ROXICODONE) 5 MG IMMEDIATE RELEASE TABLET    Take 5 mg by mouth every 8 (eight) hours as needed for severe pain.   POLYETHYL GLYCOL-PROPYL GLYCOL (SYSTANE ULTRA) 0.4-0.3 % SOLN    Apply 1 drop to eye 4 (four) times daily. Both eyes   SENNOSIDES-DOCUSATE SODIUM (SENOKOT-S) 8.6-50 MG TABLET    Take 2 tablets by mouth 2 (two) times daily.  Modified Medications   No medications on file  Discontinued Medications   DRONABINOL (MARINOL) 2.5 MG CAPSULE    Take 2.5 mg by mouth daily. Reported on 09/30/2015     SIGNIFICANT DIAGNOSTIC EXAMS  07-24-14: chest x-ray; right lower lobe atelectasis and mild cardiomegaly; no infiltrate no congestion    LABS REVIEWED:   10-07-14; glucose 94; bun 12; creat 1.06; k+ 4.5; na++ 139; liver normal albumin 4.0; tegretol 10.5  12-02-14: wbc 4.2 hgb  10.8; hct 32.3; mcv 96.1; plt 154; chol 181; ldl 119; trig 86; hdl 43; hgb a1c 6.2   05-17-15: wbc 5.6; hgb 10.3; hct 29.8; mcv 96.8; plt 184; glucose 104; bun 12; creat 0.78; k+ 4.3; na++141; liver normal albumin 3.0; cho 142; ldl 89; trig 83; hdl 36; tsh 1.036; hgb a1c 6.3; tegretol 7.5  08-11-15: hgb a1c  6.0; tegretol 7.6    Review of Systems Unable to perform ROS: Dementia     Physical Exam Constitutional: He appears well-developed and well-nourished. No distress.  Eyes: Conjunctivae are normal.  Neck: Neck supple. No JVD present. No thyromegaly present.  Cardiovascular: Normal rate, regular rhythm and intact distal pulses.   Respiratory: Effort normal and breath sounds normal. No respiratory distress. He has no wheezes.  GI: Soft. Bowel sounds are normal. He exhibits no distension. There is no tenderness.  Musculoskeletal: He exhibits no edema.  Has right side hemiparesis Is able to move left extremities    Lymphadenopathy:    He has no cervical adenopathy.  Neurological: He is alert.  Skin: Skin is warm and dry. He is not diaphoretic.  Psychiatric: He has a normal mood and affect.       ASSESSMENT/ PLAN:  1. Seizures: no reports of seizure activity present; will continue tegretol er 300 mg twice daily and 200 mg daily at noon; will monitor his status.   2. Dysphagia: no signs of aspiration present; will continue nectar thick liquids  3. CVA with right hemiplegia: no change in neurological status; will continue baclofen 10 mg twice daily for spasticity will continue plavix 75 mg daily and will continue to monitor his status.   4. Diabetes: is stable hgb a1c is 6.0; will continue lantus 10 units daily and will monitor his status.   5. Depression: is stable does receive benefit from celexa 20 mg daily  will monitor his status.   6. Constipation:   Will continue senna s  2 tabs twice daily   7. Glaucoma: will continue simbrinz to both eyes twice daily   8. Mixed  Alzheimer's and vascular dementia: he is having a slow progressive decline. He is less engaging to his environment and is not verbal most of the time.  is presently not taking medications; will not make changes will monitor his status. He weight is 155 pounds   9. Weight loss:  his weight has been stable at 155 pounds;  Will continue supplements per facility protocol.  He did complete his 30 days of marinol therapy with a 3 pound weight gain. Will monitor      Synthia Innocent NP [facility]  Contact [phone] Monday through Friday 8am- 5pm  After hours call [phone]

## 2015-10-26 ENCOUNTER — Non-Acute Institutional Stay (SKILLED_NURSING_FACILITY): Payer: Medicare Other | Admitting: Internal Medicine

## 2015-10-26 ENCOUNTER — Encounter: Payer: Self-pay | Admitting: Internal Medicine

## 2015-10-26 DIAGNOSIS — F0150 Vascular dementia without behavioral disturbance: Secondary | ICD-10-CM

## 2015-10-26 DIAGNOSIS — K5901 Slow transit constipation: Secondary | ICD-10-CM | POA: Diagnosis not present

## 2015-10-26 DIAGNOSIS — E1140 Type 2 diabetes mellitus with diabetic neuropathy, unspecified: Secondary | ICD-10-CM

## 2015-10-26 DIAGNOSIS — R55 Syncope and collapse: Secondary | ICD-10-CM | POA: Diagnosis not present

## 2015-10-26 DIAGNOSIS — Z794 Long term (current) use of insulin: Secondary | ICD-10-CM

## 2015-10-26 DIAGNOSIS — F0280 Dementia in other diseases classified elsewhere without behavioral disturbance: Secondary | ICD-10-CM | POA: Diagnosis not present

## 2015-10-26 DIAGNOSIS — G309 Alzheimer's disease, unspecified: Secondary | ICD-10-CM

## 2015-10-26 DIAGNOSIS — R569 Unspecified convulsions: Secondary | ICD-10-CM

## 2015-10-26 NOTE — Progress Notes (Signed)
DATE: 10/26/15  Location:  [facility]  Nursing Home Room Number: 123 C Place of Service: SNF [phone])   Extended Emergency Contact Information Primary Emergency Contact: Bourne,[name] Address: [address]          [place], [place] [address] [place] Home Phone: [phone] Work Phone: [phone] Relation: Spouse Secondary Emergency Contact: Ryan,Rondha Address: [address]          [place], [place] [address] [place] Home Phone: [phone] Relation: Daughter  Advanced Directive information Does patient have an advance directive?: Yes, Type of Advance Directive: Out of facility DNR (pink MOST or yellow form), Does patient want to make changes to advanced directive?: No - Patient declined  Chief Complaint  Patient presents with  . Acute Visit    patient had unresponsive episode after nausea and having bowel movement    HPI:  82 yo male long term resident seen today for syncopal episode following BM with nausea. Per nursing LOC lasted about 2 minutes. No observed seizure. He awakened without further sequela. He is nonverbal and had dementia and unable to obtain further ROS. Hx obtained from nursing and chart. Wife present and discussed current state with her. He also had a fall OOB last week. No injury.   Seizures - none reported; stable on tegretol er 300 mg twice daily and 200 mg daily at noon  Dysphagia -  no signs of aspiration present; on nectar thick liquids  Hx CVA with right hemiplegia - stable on baclofen 10 mg twice daily for spasticity; takes plavix 75 mg daily  DM  - stable with A1c 6%. Takes lantus 10 units daily. CBG 138 today   Depression - stable on celexa 20 mg daily    Constipation - stable on senna s  2 tabs twice daily   Mixed Alzheimer's and vascular dementia - slow progressive decline. less engaging to his environment and is not verbal most of the time.  is presently not taking  medications  Weight loss -  weight has been stable at 155 pounds;  Gets supplements per facility protocol.  He completed 30 days of marinol therapy with a 3 pound weight gain   Past Medical History  Diagnosis Date  . Hyperlipidemia   . Depression   . Seizures (HCC)   . Hypokalemia   . Dementia   . Hemiplegia affecting dominant side, late effect of cerebrovascular disease   . Constipation   . Edema   . Diabetes mellitus without complication (HCC)   . Stroke (HCC)   . Hypertension   . CVA (cerebral vascular accident) (HCC) 08/28/2012    No past surgical history on file.  Patient Care Team: Kirt Boys, DO as PCP - General (Internal Medicine) Sharee Holster, NP as Nurse Practitioner (Nurse Practitioner) [facility] (Skilled Nursing Facility)  Social History   Social History  . Marital Status: Married    Spouse Name: N/A  . Number of Children: N/A  . Years of Education: N/A   Occupational History  . Not on file.   Social History Main Topics  . Smoking status: Never Smoker   . Smokeless tobacco: Never Used  . Alcohol Use: No  . Drug Use: No  . Sexual Activity: No   Other Topics Concern  . Not on file   Social History Narrative     reports that he has never smoked. He has never used smokeless tobacco. He  reports that he does not drink alcohol or use illicit drugs.  Immunization History  Administered Date(s) Administered  . Influenza-Unspecified 03/05/2014, 02/22/2015  . Pneumococcal-Unspecified 03/29/2010, 03/17/2014    No Known Allergies  Medications: Patient's Medications  New Prescriptions   No medications on file  Previous Medications   ACETAMINOPHEN (TYLENOL) 325 MG TABLET    Take 650 mg by mouth every 4 (four) hours as needed for mild pain or fever.   BACLOFEN (LIORESAL) 10 MG TABLET    Take 10 mg by mouth 2 (two) times daily.    BRINZOLAMIDE-BRIMONIDINE 1-0.2 % SUSP    1 gtt in both eyes BID   CARBAMAZEPINE (CARBATROL) 300  MG 12 HR CAPSULE    Take 1 capsule (300 mg total) by mouth 2 (two) times daily.   CARBAMAZEPINE (TEGRETOL) 200 MG TABLET    Take 200 mg by mouth daily.    CITALOPRAM (CELEXA) 20 MG TABLET    Take 20 mg by mouth daily.   CLOPIDOGREL (PLAVIX) 75 MG TABLET    Take 75 mg by mouth daily.   FOOD THICKENER (THICK IT) POWD    Take 1 Container by mouth as needed. Nectar thick   GUAIFENESIN (ROBITUSSIN) 100 MG/5ML SYRUP    Take 10 mLs by mouth every 6 (six) hours as needed for cough.   INSULIN GLARGINE (LANTUS SOLOSTAR) 100 UNIT/ML SOLOSTAR PEN    Inject 5 Units into the skin every morning.    LATANOPROST (XALATAN) 0.005 % OPHTHALMIC SOLUTION    Place 1 drop into both eyes at bedtime.   MAGNESIUM HYDROXIDE (MILK OF MAGNESIA) 400 MG/5ML SUSPENSION    Take 30 mLs by mouth daily as needed for mild constipation.   MENTHOL (CEPACOL SORE THROAT) 5.4 MG LOZG    Use as directed 1 lozenge in the mouth or throat every 8 (eight) hours as needed.   MULTIPLE VITAMIN (MULTIVITAMIN) TABLET    Take 1 tablet by mouth daily.   OXYCODONE (OXY IR/ROXICODONE) 5 MG IMMEDIATE RELEASE TABLET    Take 5 mg by mouth every 8 (eight) hours as needed for severe pain.   POLYETHYL GLYCOL-PROPYL GLYCOL (SYSTANE ULTRA) 0.4-0.3 % SOLN    Apply 1 drop to eye 4 (four) times daily. Both eyes   SENNOSIDES-DOCUSATE SODIUM (SENOKOT-S) 8.6-50 MG TABLET    Take 2 tablets by mouth 2 (two) times daily.  Modified Medications   No medications on file  Discontinued Medications   No medications on file    Review of Systems  Unable to perform ROS: Dementia    Filed Vitals:   10/26/15 1237  BP: 164/81  Pulse: 87  Temp: 97.2 F (36.2 C)  TempSrc: Oral  Resp: 18  Height:  (1.778 m)  Weight: 170 lb 6.4 oz (77.293 kg)  SpO2: 97%   Body mass index is 24.45 kg/(m^2).  Physical Exam  Constitutional: He appears well-developed. No distress.  Frail appearing lying in bed in NAD, nonverbal  HENT:  Mouth/Throat: Oropharynx is clear and  moist.  Eyes: Pupils are equal, round, and reactive to light. No scleral icterus.  Neck: Neck supple. Carotid bruit is not present. No thyromegaly present.  Cardiovascular: Normal rate, regular rhythm and intact distal pulses.  Exam reveals no gallop and no friction rub.   Murmur (1/6 SEM) heard. No LE edema b/l. No calf TTP  Pulmonary/Chest: Effort normal and breath sounds normal. He has no wheezes. He has no rales. He exhibits no tenderness.  Abdominal: Soft. Bowel sounds are  normal. He exhibits no distension, no abdominal bruit, no pulsatile midline mass and no mass. There is no tenderness. There is no rebound and no guarding.  Musculoskeletal: He exhibits edema and tenderness.  RUE contracture- flexion at elbow and fingers  Lymphadenopathy:    He has no cervical adenopathy.  Neurological: He is alert. He exhibits abnormal muscle tone.  Right hemiparesis with muscle atrophy and spasticity noted  Skin: Skin is warm and dry. Rash (RLE topical med intact) noted.  prevalon boots intact b/l  Psychiatric: He has a normal mood and affect. His behavior is normal.     Labs reviewed: Nursing Home on 09/06/2015  Component Date Value Ref Range Status  . Glucose 08/11/2015 126   Final  . Hemoglobin A1C 08/11/2015 6.0   Final  . Carbamazepine, Total 08/11/2015 7.6   Final  Nursing Home on 08/09/2015  Component Date Value Ref Range Status  . Hemoglobin 05/17/2015 10.3* 13.5 - 17.5 g/dL Final  . HCT 40/98/119112/19/2016 30* 41 - 53 % Final  . Platelets 05/17/2015 184  150 - 399 K/L Final  . WBC 05/17/2015 5.6   Final  . Glucose 05/17/2015 104   Final  . BUN 05/17/2015 12  4 - 21 mg/dL Final  . Creatinine 47/82/956212/19/2016 0.8  0.6 - 1.3 mg/dL Final  . Potassium 13/08/657812/19/2016 4.3  3.4 - 5.3 mmol/L Final  . Sodium 05/17/2015 141  137 - 147 mmol/L Final  . Carbamazepine 05/17/2015 7.5   Final    No results found.   Assessment/Plan   ICD-9-CM ICD-10-CM   1. Vasovagal syncope - new 780.2 R55   2. Seizures  (HCC) 780.39 R56.9   3. Mixed Alzheimer's and vascular dementia - slowly declining 331.0 G30.9    294.10 F01.50    290.40 F02.80   4. Slow transit constipation 564.01 K59.01   5. Controlled type 2 diabetes mellitus with diabetic neuropathy, with long-term current use of insulin (HCC) 250.60 E11.40    357.2 Z79.4    V58.67      Check labs -cbc w diff, cmp, ua c &s  Cont other meds as ordered  Fall precautions  PT/OT/ST as ordered  Will follow  Jenene Kauffmann S. Ancil Linseyarter, D. O., F. A. C. O. I.  [facility] [address] [place], [place] [address] [phone] Cell (Monday-Friday 8 AM - 5 PM) [phone] After 5 PM and follow prompts

## 2015-10-27 ENCOUNTER — Encounter: Payer: Self-pay | Admitting: Adult Health

## 2015-10-27 LAB — CBC AND DIFFERENTIAL
HCT: 30 % — AB (ref 41–53)
HEMOGLOBIN: 10.3 g/dL — AB (ref 13.5–17.5)
PLATELETS: 271 10*3/uL (ref 150–399)
WBC: 6.6 10*3/mL

## 2015-10-27 LAB — HEPATIC FUNCTION PANEL
ALT: 9 U/L — AB (ref 10–40)
AST: 10 U/L — AB (ref 14–40)
Alkaline Phosphatase: 99 U/L (ref 25–125)

## 2015-10-27 LAB — BASIC METABOLIC PANEL
BUN: 20 mg/dL (ref 4–21)
Creatinine: 1 mg/dL (ref 0.6–1.3)
Glucose: 83 mg/dL
Potassium: 4.4 mmol/L (ref 3.4–5.3)
Sodium: 142 mmol/L (ref 137–147)

## 2015-10-27 NOTE — Progress Notes (Signed)
This encounter was created in error - please disregard.

## 2015-10-28 ENCOUNTER — Encounter: Payer: Self-pay | Admitting: Adult Health

## 2015-10-28 ENCOUNTER — Non-Acute Institutional Stay (SKILLED_NURSING_FACILITY): Payer: Medicare Other | Admitting: Adult Health

## 2015-10-28 DIAGNOSIS — E1140 Type 2 diabetes mellitus with diabetic neuropathy, unspecified: Secondary | ICD-10-CM

## 2015-10-28 DIAGNOSIS — R1312 Dysphagia, oropharyngeal phase: Secondary | ICD-10-CM

## 2015-10-28 DIAGNOSIS — Z794 Long term (current) use of insulin: Secondary | ICD-10-CM

## 2015-10-28 DIAGNOSIS — I69959 Hemiplegia and hemiparesis following unspecified cerebrovascular disease affecting unspecified side: Secondary | ICD-10-CM

## 2015-10-28 DIAGNOSIS — R634 Abnormal weight loss: Secondary | ICD-10-CM | POA: Diagnosis not present

## 2015-10-28 DIAGNOSIS — F0150 Vascular dementia without behavioral disturbance: Secondary | ICD-10-CM | POA: Diagnosis not present

## 2015-10-28 DIAGNOSIS — F0280 Dementia in other diseases classified elsewhere without behavioral disturbance: Secondary | ICD-10-CM

## 2015-10-28 DIAGNOSIS — R569 Unspecified convulsions: Secondary | ICD-10-CM

## 2015-10-28 DIAGNOSIS — G309 Alzheimer's disease, unspecified: Secondary | ICD-10-CM | POA: Diagnosis not present

## 2015-10-28 DIAGNOSIS — K5901 Slow transit constipation: Secondary | ICD-10-CM

## 2015-10-28 NOTE — Progress Notes (Signed)
Patient ID: Danny Gilbert, male   DOB: 08-30-1923, 82 y.o.   MRN: [id number]    Location:  [facility] Room Number: 123-C Place of Service:  SNF (31)   CODE STATUS: DNR  No Known Allergies  Chief Complaint  Patient presents with  . Medical Management of Chronic Issues    Follow up    HPI:  He is a long term resident of this facility being seen for the management of his chronic illnesses. He did have a fall this past week without injury present. He apparently slid out of bed. He is unable to participate in the hpi or ros. There are no nursing concerns at this time.   Past Medical History  Diagnosis Date  . Hyperlipidemia   . Depression   . Seizures (HCC)   . Hypokalemia   . Dementia   . Hemiplegia affecting dominant side, late effect of cerebrovascular disease   . Constipation   . Edema   . Diabetes mellitus without complication (HCC)   . Stroke (HCC)   . Hypertension   . CVA (cerebral vascular accident) (HCC) 08/28/2012    History reviewed. No pertinent past surgical history.  Social History   Social History  . Marital Status: Married    Spouse Name: N/A  . Number of Children: N/A  . Years of Education: N/A   Occupational History  . Not on file.   Social History Main Topics  . Smoking status: Never Smoker   . Smokeless tobacco: Never Used  . Alcohol Use: No  . Drug Use: No  . Sexual Activity: No   Other Topics Concern  . Not on file   Social History Narrative   History reviewed. No pertinent family history.    VITAL SIGNS BP 164/81 mmHg  Pulse 87  Temp(Src) 97.2 F (36.2 C) (Oral)  Resp 18  Ht 5\' 10"  (1.778 m)  Wt 170 lb 4 oz (77.225 kg)  BMI 24.43 kg/m2  SpO2 97%  Patient's Medications  New Prescriptions   No medications on file  Previous Medications   ACETAMINOPHEN (TYLENOL) 325 MG TABLET    Take 650 mg by mouth every 4 (four) hours as needed for mild pain or fever.   BACLOFEN (LIORESAL) 10 MG TABLET     Take 10 mg by mouth 2 (two) times daily.    BRINZOLAMIDE-BRIMONIDINE 1-0.2 % SUSP    1 gtt in both eyes BID   CARBAMAZEPINE (CARBATROL) 300 MG 12 HR CAPSULE    Take 1 capsule (300 mg total) by mouth 2 (two) times daily.   CARBAMAZEPINE (TEGRETOL) 200 MG TABLET    Take 200 mg by mouth daily.    CITALOPRAM (CELEXA) 20 MG TABLET    Take 20 mg by mouth daily.   CLOPIDOGREL (PLAVIX) 75 MG TABLET    Take 75 mg by mouth daily.   FOOD THICKENER (THICK IT) POWD    Take 1 Container by mouth as needed. Nectar thick   GUAIFENESIN (ROBITUSSIN) 100 MG/5ML SYRUP    Take 10 mLs by mouth every 6 (six) hours as needed for cough.   INSULIN GLARGINE (LANTUS SOLOSTAR) 100 UNIT/ML SOLOSTAR PEN    Inject 5 Units into the skin every morning.    LATANOPROST (XALATAN) 0.005 % OPHTHALMIC SOLUTION    Place 1 drop into both eyes at bedtime.   MENTHOL (CEPACOL SORE THROAT) 5.4 MG LOZG    Use as directed 1 lozenge in the mouth or throat every  8 (eight) hours as needed.   MULTIPLE VITAMIN (MULTIVITAMIN) TABLET    Take 1 tablet by mouth daily.   OXYCODONE (OXY IR/ROXICODONE) 5 MG IMMEDIATE RELEASE TABLET    Take 5 mg by mouth every 8 (eight) hours as needed for severe pain.   POLYETHYL GLYCOL-PROPYL GLYCOL (SYSTANE ULTRA) 0.4-0.3 % SOLN    Apply 1 drop to eye 4 (four) times daily. Both eyes   SENNOSIDES-DOCUSATE SODIUM (SENOKOT-S) 8.6-50 MG TABLET    Take 2 tablets by mouth 2 (two) times daily.  Modified Medications   No medications on file  Discontinued Medications   No medications on file     SIGNIFICANT DIAGNOSTIC EXAMS  07-24-14: chest x-ray; right lower lobe atelectasis and mild cardiomegaly; no infiltrate no congestion    LABS REVIEWED:    12-02-14: wbc 4.2 hgb 10.8; hct 32.3; mcv 96.1; plt 154; chol 181; ldl 119; trig 86; hdl 43; hgb a1c 6.2   05-17-15: wbc 5.6; hgb 10.3; hct 29.8; mcv 96.8; plt 184; glucose 104; bun 12; creat 0.78; k+ 4.3; na++141; liver normal albumin 3.0; cho 142; ldl 89; trig 83; hdl 36; tsh  1.036; hgb a1c 6.3; tegretol 7.5  08-11-15: hgb a1c  6.0; tegretol 7.6  10-27-15: wbc 6.6; hgb 10.3; hct 30.3; mcv 97.1; plt 271; glucose 83; bun 20; creat 0.99; k+ 4.4; na++142; ;liver normal albumin 3.4    Review of Systems Unable to perform ROS: Dementia     Physical Exam Constitutional: He appears well-developed and well-nourished. No distress.  Eyes: Conjunctivae are normal.  Neck: Neck supple. No JVD present. No thyromegaly present.  Cardiovascular: Normal rate, regular rhythm and intact distal pulses.   Respiratory: Effort normal and breath sounds normal. No respiratory distress. He has no wheezes.  GI: Soft. Bowel sounds are normal. He exhibits no distension. There is no tenderness.  Musculoskeletal: He exhibits no edema.  Has right side hemiparesis does have upper extremity contracture  Does not move voluntarily   Lymphadenopathy:    He has no cervical adenopathy.  Neurological:not engaging Skin: Skin is warm and dry. He is not diaphoretic.  Psychiatric: He has a normal mood and affect.       ASSESSMENT/ PLAN:  1. Seizures: no reports of seizure activity present; will continue tegretol er 300 mg twice daily and 200 mg daily at noon; will monitor his status.   2. Dysphagia: no signs of aspiration present; will continue nectar thick liquids  3. CVA with right hemiplegia: no change in neurological status; will continue baclofen 10 mg twice daily for spasticity will continue plavix 75 mg daily and will continue to monitor his status.   4. Diabetes: is stable hgb a1c is 6.0; will lower lantus to 7 units nightly and will monitor   5. Depression: is stable does receive benefit from celexa 20 mg daily  will monitor his status.   6. Constipation:   Will continue senna s  2 tabs twice daily   7. Glaucoma: will continue simbrinz to both eyes twice daily   8. Mixed Alzheimer's and vascular dementia: he is having a slow progressive decline. He is not verbal nearly all  of the  time.  is presently not taking medications; will not make changes will monitor his status. He weight is 170 pounds   9. Weight loss: his weight has been stable at 170 pounds;  Will continue supplements per facility protocol.  I will have the staff weigh him weekly for 3 weeks to ensure the accuracy  of his weights.   10. Hypertension: he has an elevated reading; will have nursing check blood pressure twice daily for one week and report back results.      Synthia Innocent NP [facility]  Contact [phone] Monday through Friday 8am- 5pm  After hours call [phone]

## 2015-11-18 ENCOUNTER — Non-Acute Institutional Stay (SKILLED_NURSING_FACILITY): Payer: Medicare Other | Admitting: Adult Health

## 2015-11-18 DIAGNOSIS — T148 Other injury of unspecified body region: Secondary | ICD-10-CM | POA: Diagnosis not present

## 2015-11-18 DIAGNOSIS — T148XXA Other injury of unspecified body region, initial encounter: Secondary | ICD-10-CM

## 2015-12-03 ENCOUNTER — Non-Acute Institutional Stay (SKILLED_NURSING_FACILITY): Payer: Medicare Other | Admitting: Adult Health

## 2015-12-03 ENCOUNTER — Encounter: Payer: Self-pay | Admitting: Adult Health

## 2015-12-03 DIAGNOSIS — I6930 Unspecified sequelae of cerebral infarction: Secondary | ICD-10-CM

## 2015-12-03 DIAGNOSIS — F0150 Vascular dementia without behavioral disturbance: Secondary | ICD-10-CM

## 2015-12-03 DIAGNOSIS — I69959 Hemiplegia and hemiparesis following unspecified cerebrovascular disease affecting unspecified side: Secondary | ICD-10-CM | POA: Diagnosis not present

## 2015-12-03 DIAGNOSIS — F0280 Dementia in other diseases classified elsewhere without behavioral disturbance: Secondary | ICD-10-CM

## 2015-12-03 DIAGNOSIS — G309 Alzheimer's disease, unspecified: Secondary | ICD-10-CM | POA: Diagnosis not present

## 2015-12-03 DIAGNOSIS — R1312 Dysphagia, oropharyngeal phase: Secondary | ICD-10-CM

## 2015-12-03 DIAGNOSIS — Z794 Long term (current) use of insulin: Secondary | ICD-10-CM | POA: Diagnosis not present

## 2015-12-03 DIAGNOSIS — E1140 Type 2 diabetes mellitus with diabetic neuropathy, unspecified: Secondary | ICD-10-CM | POA: Diagnosis not present

## 2015-12-03 DIAGNOSIS — R569 Unspecified convulsions: Secondary | ICD-10-CM | POA: Diagnosis not present

## 2015-12-03 NOTE — Progress Notes (Signed)
Patient ID: Danny Gilbert, male   DOB: 07/16/1923, 82 y.o.   MRN: [id number]   Location:   [facility] Room Number: 123-C Place of Service:  SNF (31)   CODE STATUS: DNR  No Known Allergies  Chief Complaint  Patient presents with  . Medical Management of Chronic Issues    Follow up    HPI:  He is a long term resident of this facility being seen for the management of his chronic illnesses. Overall there is little change in his status. He is unable to participate in the hpi or ros. He is generally nonverbal. There are no nursing concerns at this time.   Past Medical History  Diagnosis Date  . Hyperlipidemia   . Depression   . Seizures (HCC)   . Hypokalemia   . Dementia   . Hemiplegia affecting dominant side, late effect of cerebrovascular disease   . Constipation   . Edema   . Diabetes mellitus without complication (HCC)   . Stroke (HCC)   . Hypertension   . CVA (cerebral vascular accident) (HCC) 08/28/2012    History reviewed. No pertinent past surgical history.  Social History   Social History  . Marital Status: Married    Spouse Name: N/A  . Number of Children: N/A  . Years of Education: N/A   Occupational History  . Not on file.   Social History Main Topics  . Smoking status: Never Smoker   . Smokeless tobacco: Never Used  . Alcohol Use: No  . Drug Use: No  . Sexual Activity: No   Other Topics Concern  . Not on file   Social History Narrative   History reviewed. No pertinent family history.    VITAL SIGNS BP 121/58 mmHg  Pulse 62  Temp(Src) 98.2 F (36.8 C) (Oral)  Resp 18  SpO2 97%  Patient's Medications  New Prescriptions   No medications on file  Previous Medications   ACETAMINOPHEN (TYLENOL) 325 MG TABLET    Take 650 mg by mouth every 4 (four) hours as needed for mild pain or fever.   BACLOFEN (LIORESAL) 10 MG TABLET    Take 10 mg by mouth 2 (two) times daily.    BRINZOLAMIDE-BRIMONIDINE 1-0.2 % SUSP    1 gtt in both eyes  BID   CARBAMAZEPINE (CARBATROL) 300 MG 12 HR CAPSULE    Take 1 capsule (300 mg total) by mouth 2 (two) times daily.   CARBAMAZEPINE (TEGRETOL) 200 MG TABLET    Take 200 mg by mouth daily.    CITALOPRAM (CELEXA) 20 MG TABLET    Take 20 mg by mouth daily.   CLOPIDOGREL (PLAVIX) 75 MG TABLET    Take 75 mg by mouth daily.   FOOD THICKENER (THICK IT) POWD    Take 1 Container by mouth as needed. Nectar thick   GUAIFENESIN (ROBITUSSIN) 100 MG/5ML SYRUP    Take 10 mLs by mouth every 6 (six) hours as needed for cough.   INSULIN GLARGINE (LANTUS SOLOSTAR) 100 UNIT/ML SOLOSTAR PEN    Inject 5 Units into the skin every morning.    LATANOPROST (XALATAN) 0.005 % OPHTHALMIC SOLUTION    Place 1 drop into both eyes at bedtime.   MENTHOL (CEPACOL SORE THROAT) 5.4 MG LOZG    Use as directed 1 lozenge in the mouth or throat every 8 (eight) hours as needed.   MULTIPLE VITAMIN (MULTIVITAMIN) TABLET    Take 1 tablet by mouth daily.   OXYCODONE (OXY IR/ROXICODONE)  5 MG IMMEDIATE RELEASE TABLET    Take 5 mg by mouth every 8 (eight) hours as needed for severe pain. Reported on 12/03/2015   POLYETHYL GLYCOL-PROPYL GLYCOL (SYSTANE ULTRA) 0.4-0.3 % SOLN    Apply 1 drop to eye 4 (four) times daily. Both eyes   SENNOSIDES-DOCUSATE SODIUM (SENOKOT-S) 8.6-50 MG TABLET    Take 2 tablets by mouth 2 (two) times daily.   ZINC SULFATE 220 (50 ZN) MG CAPSULE    Take 220 mg by mouth daily.  Modified Medications   No medications on file  Discontinued Medications   No medications on file     SIGNIFICANT DIAGNOSTIC EXAMS  07-24-14: chest x-ray; right lower lobe atelectasis and mild cardiomegaly; no infiltrate no congestion    LABS REVIEWED:     05-17-15: wbc 5.6; hgb 10.3; hct 29.8; mcv 96.8; plt 184; glucose 104; bun 12; creat 0.78; k+ 4.3; na++141; liver normal albumin 3.0; cho 142; ldl 89; trig 83; hdl 36; tsh 1.036; hgb a1c 6.3; tegretol 7.5  08-11-15: hgb a1c  6.0; tegretol 7.6  10-27-15: wbc 6.6; hgb 10.3; hct 30.3; mcv 97.1;  plt 271; glucose 83; bun 20; creat 0.99; k+ 4.4; na++142; ;liver normal albumin 3.4    Review of Systems Unable to perform ROS: Dementia     Physical Exam Constitutional: He appears well-developed and well-nourished. No distress.  Eyes: Conjunctivae are normal.  Neck: Neck supple. No JVD present. No thyromegaly present.  Cardiovascular: Normal rate, regular rhythm and intact distal pulses.   Respiratory: Effort normal and breath sounds normal. No respiratory distress. He has no wheezes.  GI: Soft. Bowel sounds are normal. He exhibits no distension. There is no tenderness.  Musculoskeletal: He exhibits no edema.  Has right side hemiparesis does have upper extremity contracture  Does not move voluntarily   Lymphadenopathy:    He has no cervical adenopathy.  Neurological:not engaging Skin: Skin is warm and dry. He is not diaphoretic.  Psychiatric: He has a normal mood and affect.       ASSESSMENT/ PLAN:  1. Seizures: no reports of seizure activity present; will continue tegretol er 300 mg twice daily and 200 mg daily at noon; will monitor his status.   2. Dysphagia: no signs of aspiration present; will continue nectar thick liquids  3. CVA with right hemiplegia: no change in neurological status; will continue baclofen 10 mg twice daily for spasticity will continue plavix 75 mg daily and will continue to monitor his status.   4. Diabetes: is stable hgb a1c is 6.0; will lower lantus to 7 units nightly and will monitor   5. Depression: is stable does receive benefit from celexa 20 mg daily  will monitor his status.   6. Constipation:   Will continue senna s  2 tabs twice daily   7. Glaucoma: will continue simbrinz to both eyes twice daily   8. Mixed Alzheimer's and vascular dementia: he is having a slow progressive decline. He is not verbal nearly all  of the time.  is presently not taking medications; will not make changes will monitor his status. He weight is 170 pounds   9.  Weight loss: his weight has been stable at 170 pounds;  Will continue supplements per facility protocol.  I will have the staff weigh him weekly for 3 weeks to ensure the accuracy of his weights.   10. Hypertension: is presently not on medications will not make changes will monitor    Will check hgb a1c  Ok Edwards NP [facility]  Contact [phone] Monday through Friday 8am- 5pm  After hours call [phone]

## 2015-12-06 ENCOUNTER — Non-Acute Institutional Stay (SKILLED_NURSING_FACILITY): Payer: Medicare Other | Admitting: Adult Health

## 2015-12-06 ENCOUNTER — Emergency Department (HOSPITAL_COMMUNITY): Payer: Medicaid Other

## 2015-12-06 ENCOUNTER — Inpatient Hospital Stay (HOSPITAL_COMMUNITY)
Admission: EM | Admit: 2015-12-06 | Discharge: 2015-12-09 | DRG: 690 | Disposition: A | Discharge status: Skilled Nursing Facility | Payer: Medicaid Other | Attending: Internal Medicine | Admitting: Internal Medicine

## 2015-12-06 ENCOUNTER — Encounter: Payer: Self-pay | Admitting: Adult Health

## 2015-12-06 ENCOUNTER — Encounter (HOSPITAL_COMMUNITY): Payer: Self-pay

## 2015-12-06 DIAGNOSIS — Z7401 Bed confinement status: Secondary | ICD-10-CM

## 2015-12-06 DIAGNOSIS — G309 Alzheimer's disease, unspecified: Secondary | ICD-10-CM | POA: Diagnosis present

## 2015-12-06 DIAGNOSIS — Z8249 Family history of ischemic heart disease and other diseases of the circulatory system: Secondary | ICD-10-CM

## 2015-12-06 DIAGNOSIS — Z66 Do not resuscitate: Secondary | ICD-10-CM | POA: Diagnosis present

## 2015-12-06 DIAGNOSIS — E1149 Type 2 diabetes mellitus with other diabetic neurological complication: Secondary | ICD-10-CM | POA: Diagnosis present

## 2015-12-06 DIAGNOSIS — N179 Acute kidney failure, unspecified: Secondary | ICD-10-CM | POA: Diagnosis not present

## 2015-12-06 DIAGNOSIS — F329 Major depressive disorder, single episode, unspecified: Secondary | ICD-10-CM | POA: Diagnosis present

## 2015-12-06 DIAGNOSIS — R1310 Dysphagia, unspecified: Secondary | ICD-10-CM | POA: Insufficient documentation

## 2015-12-06 DIAGNOSIS — I6930 Unspecified sequelae of cerebral infarction: Secondary | ICD-10-CM

## 2015-12-06 DIAGNOSIS — Z79891 Long term (current) use of opiate analgesic: Secondary | ICD-10-CM

## 2015-12-06 DIAGNOSIS — I10 Essential (primary) hypertension: Secondary | ICD-10-CM | POA: Diagnosis present

## 2015-12-06 DIAGNOSIS — E785 Hyperlipidemia, unspecified: Secondary | ICD-10-CM | POA: Diagnosis present

## 2015-12-06 DIAGNOSIS — Z7902 Long term (current) use of antithrombotics/antiplatelets: Secondary | ICD-10-CM

## 2015-12-06 DIAGNOSIS — R627 Adult failure to thrive: Secondary | ICD-10-CM | POA: Insufficient documentation

## 2015-12-06 DIAGNOSIS — I69351 Hemiplegia and hemiparesis following cerebral infarction affecting right dominant side: Secondary | ICD-10-CM

## 2015-12-06 DIAGNOSIS — E860 Dehydration: Secondary | ICD-10-CM | POA: Diagnosis present

## 2015-12-06 DIAGNOSIS — L8990 Pressure ulcer of unspecified site, unspecified stage: Secondary | ICD-10-CM | POA: Insufficient documentation

## 2015-12-06 DIAGNOSIS — F0150 Vascular dementia without behavioral disturbance: Secondary | ICD-10-CM | POA: Diagnosis present

## 2015-12-06 DIAGNOSIS — R569 Unspecified convulsions: Secondary | ICD-10-CM

## 2015-12-06 DIAGNOSIS — N390 Urinary tract infection, site not specified: Secondary | ICD-10-CM | POA: Diagnosis not present

## 2015-12-06 DIAGNOSIS — Z794 Long term (current) use of insulin: Secondary | ICD-10-CM

## 2015-12-06 DIAGNOSIS — G40909 Epilepsy, unspecified, not intractable, without status epilepticus: Secondary | ICD-10-CM | POA: Diagnosis present

## 2015-12-06 DIAGNOSIS — Z515 Encounter for palliative care: Secondary | ICD-10-CM | POA: Insufficient documentation

## 2015-12-06 DIAGNOSIS — L89159 Pressure ulcer of sacral region, unspecified stage: Secondary | ICD-10-CM | POA: Diagnosis present

## 2015-12-06 DIAGNOSIS — Z79899 Other long term (current) drug therapy: Secondary | ICD-10-CM

## 2015-12-06 DIAGNOSIS — F0280 Dementia in other diseases classified elsewhere without behavioral disturbance: Secondary | ICD-10-CM | POA: Diagnosis present

## 2015-12-06 LAB — CBC WITH DIFFERENTIAL/PLATELET
BASOS ABS: 0 10*3/uL (ref 0.0–0.1)
Basophils Relative: 0 %
EOS PCT: 5 %
Eosinophils Absolute: 0.2 10*3/uL (ref 0.0–0.7)
HEMATOCRIT: 40.2 % (ref 39.0–52.0)
Hemoglobin: 13.1 g/dL (ref 13.0–17.0)
LYMPHS ABS: 2 10*3/uL (ref 0.7–4.0)
LYMPHS PCT: 39 %
MCH: 33.2 pg (ref 26.0–34.0)
MCHC: 32.6 g/dL (ref 30.0–36.0)
MCV: 101.8 fL — AB (ref 78.0–100.0)
MONO ABS: 0.7 10*3/uL (ref 0.1–1.0)
MONOS PCT: 13 %
Neutro Abs: 2.3 10*3/uL (ref 1.7–7.7)
Neutrophils Relative %: 43 %
PLATELETS: 207 10*3/uL (ref 150–400)
RBC: 3.95 MIL/uL — ABNORMAL LOW (ref 4.22–5.81)
RDW: 13.2 % (ref 11.5–15.5)
WBC: 5.2 10*3/uL (ref 4.0–10.5)

## 2015-12-06 LAB — CBC
HCT: 36.6 % — ABNORMAL LOW (ref 39.0–52.0)
Hemoglobin: 11.9 g/dL — ABNORMAL LOW (ref 13.0–17.0)
MCH: 32.7 pg (ref 26.0–34.0)
MCHC: 32.5 g/dL (ref 30.0–36.0)
MCV: 100.5 fL — AB (ref 78.0–100.0)
PLATELETS: 208 10*3/uL (ref 150–400)
RBC: 3.64 MIL/uL — ABNORMAL LOW (ref 4.22–5.81)
RDW: 13 % (ref 11.5–15.5)
WBC: 5.9 10*3/uL (ref 4.0–10.5)

## 2015-12-06 LAB — COMPREHENSIVE METABOLIC PANEL
ALBUMIN: 3.5 g/dL (ref 3.5–5.0)
ALK PHOS: 111 U/L (ref 38–126)
ALT: 26 U/L (ref 17–63)
AST: 32 U/L (ref 15–41)
Anion gap: 14 (ref 5–15)
BILIRUBIN TOTAL: 1.4 mg/dL — AB (ref 0.3–1.2)
BUN: 21 mg/dL — AB (ref 6–20)
CO2: 23 mmol/L (ref 22–32)
Calcium: 9.4 mg/dL (ref 8.9–10.3)
Chloride: 105 mmol/L (ref 101–111)
Creatinine, Ser: 1.01 mg/dL (ref 0.61–1.24)
GFR calc Af Amer: >60 mL/min (ref >60)
GFR calc non Af Amer: >60 mL/min (ref >60)
GLUCOSE: 68 mg/dL (ref 65–99)
POTASSIUM: 5.4 mmol/L — AB (ref 3.5–5.1)
SODIUM: 142 mmol/L (ref 135–145)
TOTAL PROTEIN: 7.5 g/dL (ref 6.5–8.1)

## 2015-12-06 LAB — URINALYSIS, ROUTINE W REFLEX MICROSCOPIC
GLUCOSE, UA: NEGATIVE mg/dL
Ketones, ur: >80 mg/dL — AB
Nitrite: POSITIVE — AB
PH: 5.5 (ref 5.0–8.0)
PROTEIN: 30 mg/dL — AB
Specific Gravity, Urine: 1.029 (ref 1.005–1.030)

## 2015-12-06 LAB — URINE MICROSCOPIC-ADD ON

## 2015-12-06 LAB — HEMOGLOBIN A1C: HEMOGLOBIN A1C: 5.5

## 2015-12-06 MED ORDER — ONDANSETRON HCL 4 MG/2ML IJ SOLN: 4.0000 mg | Freq: Four times a day (QID) | INTRAMUSCULAR | Status: DC | PRN

## 2015-12-06 MED ORDER — INSULIN ASPART 100 UNIT/ML ~~LOC~~ SOLN
0.0000 [IU] | SUBCUTANEOUS | Status: DC
  Administered 2015-12-07 – 2015-12-08 (×5): 1 [IU] via SUBCUTANEOUS

## 2015-12-06 MED ORDER — DEXTROSE-NACL 5-0.45 % IV SOLN: INTRAVENOUS | Status: DC

## 2015-12-06 MED ORDER — SODIUM CHLORIDE 0.9 % IV BOLUS (SEPSIS)
500.0000 mL | Freq: Once | INTRAVENOUS | Status: AC
  Administered 2015-12-06: 500 mL via INTRAVENOUS

## 2015-12-06 MED ORDER — DEXTROSE 5 % IV SOLN
1.0000 g | INTRAVENOUS | Status: DC
  Administered 2015-12-07 – 2015-12-08 (×2): 1 g via INTRAVENOUS
  Filled 2015-12-06 (×3): qty 10

## 2015-12-06 MED ORDER — CEFTRIAXONE SODIUM 1 G IJ SOLR
1.0000 g | Freq: Once | INTRAMUSCULAR | Status: AC
  Administered 2015-12-06: 1 g via INTRAVENOUS
  Filled 2015-12-06: qty 10

## 2015-12-06 MED ORDER — ENOXAPARIN SODIUM 40 MG/0.4ML ~~LOC~~ SOLN
40.0000 mg | Freq: Every day | SUBCUTANEOUS | Status: DC
  Administered 2015-12-07 – 2015-12-09 (×3): 40 mg via SUBCUTANEOUS
  Filled 2015-12-06 (×3): qty 0.4

## 2015-12-06 MED ORDER — LATANOPROST 0.005 % OP SOLN
1.0000 [drp] | Freq: Every day | OPHTHALMIC | Status: DC
  Administered 2015-12-07 – 2015-12-08 (×3): 1 [drp] via OPHTHALMIC
  Filled 2015-12-06: qty 2.5

## 2015-12-06 MED ORDER — ONDANSETRON HCL 4 MG PO TABS: 4.0000 mg | ORAL_TABLET | Freq: Four times a day (QID) | ORAL | Status: DC | PRN

## 2015-12-06 MED ORDER — ACETAMINOPHEN 650 MG RE SUPP: 650.0000 mg | Freq: Four times a day (QID) | RECTAL | Status: DC | PRN

## 2015-12-06 MED ORDER — SODIUM CHLORIDE 0.45 % IV SOLN
INTRAVENOUS | Status: DC
  Administered 2015-12-07: 01:00:00 via INTRAVENOUS

## 2015-12-06 MED ORDER — ACETAMINOPHEN 325 MG PO TABS: 650.0000 mg | ORAL_TABLET | Freq: Four times a day (QID) | ORAL | Status: DC | PRN

## 2015-12-06 MED ORDER — SODIUM CHLORIDE 0.9 % IV SOLN
500.0000 mg | Freq: Two times a day (BID) | INTRAVENOUS | Status: DC
  Administered 2015-12-07 – 2015-12-09 (×6): 500 mg via INTRAVENOUS
  Filled 2015-12-06 (×7): qty 5

## 2015-12-06 MED ORDER — SODIUM CHLORIDE 0.45 % IV SOLN
INTRAVENOUS | Status: DC
  Filled 2015-12-06: qty 1000

## 2015-12-06 NOTE — Progress Notes (Addendum)
Location:    Actuary of Service:  SNF (31)   CODE STATUS: full code   No Known Allergies  Chief Complaint  Patient presents with  . Acute Visit    patient status     HPI:  Staff reports that he is less responsive; he has not had any significant intake in the past 3-4 days. He is not responsive; he is diaphoretic; with dry membranes. There are no reports of fever present. I have spoken with his family regarding his status. I have explained that this does appear to be a progression of his dementia; however; they wish to be sure.     Past Medical History  Diagnosis Date  . Hyperlipidemia   . Depression   . Seizures (HCC)   . Hypokalemia   . Dementia   . Hemiplegia affecting dominant side, late effect of cerebrovascular disease   . Constipation   . Edema   . Diabetes mellitus without complication (HCC)   . Stroke (HCC)   . Hypertension   . CVA (cerebral vascular accident) (HCC) 08/28/2012    No past surgical history on file.  Social History   Social History  . Marital Status: Married    Spouse Name: N/A  . Number of Children: N/A  . Years of Education: N/A   Occupational History  . Not on file.   Social History Main Topics  . Smoking status: Never Smoker   . Smokeless tobacco: Never Used  . Alcohol Use: No  . Drug Use: No  . Sexual Activity: No   Other Topics Concern  . Not on file   Social History Narrative   No family history on file.    VITAL SIGNS BP 138/74 mmHg  Pulse 84  Temp(Src) 98.4 F (36.9 C)  Resp 16  Ht  (1.778 m)  Wt 141 lb 3.2 oz (64.048 kg)  BMI 20.26 kg/m2  Patient's Medications  New Prescriptions   No medications on file  Previous Medications   ACETAMINOPHEN (TYLENOL) 325 MG TABLET    Take 650 mg by mouth every 4 (four) hours as needed for mild pain or fever.   BACLOFEN (LIORESAL) 10 MG TABLET    Take 10 mg by mouth 2 (two) times daily.    BRINZOLAMIDE-BRIMONIDINE 1-0.2 % SUSP    1 gtt in both eyes  BID   CARBAMAZEPINE (CARBATROL) 300 MG 12 HR CAPSULE    Take 1 capsule (300 mg total) by mouth 2 (two) times daily.   CARBAMAZEPINE (TEGRETOL) 200 MG TABLET    Take 200 mg by mouth daily.    CITALOPRAM (CELEXA) 20 MG TABLET    Take 20 mg by mouth daily.   CLOPIDOGREL (PLAVIX) 75 MG TABLET    Take 75 mg by mouth daily.   FOOD THICKENER (THICK IT) POWD    Take 1 Container by mouth as needed. Nectar thick   GUAIFENESIN (ROBITUSSIN) 100 MG/5ML SYRUP    Take 10 mLs by mouth every 6 (six) hours as needed for cough.   INSULIN GLARGINE (LANTUS SOLOSTAR) 100 UNIT/ML SOLOSTAR PEN    Inject 5 Units into the skin every morning.    LATANOPROST (XALATAN) 0.005 % OPHTHALMIC SOLUTION    Place 1 drop into both eyes at bedtime.   MENTHOL (CEPACOL SORE THROAT) 5.4 MG LOZG    Use as directed 1 lozenge in the mouth or throat every 8 (eight) hours as needed.   MULTIPLE VITAMIN (MULTIVITAMIN)  TABLET    Take 1 tablet by mouth daily.   OXYCODONE (OXY IR/ROXICODONE) 5 MG IMMEDIATE RELEASE TABLET    Take 5 mg by mouth every 8 (eight) hours as needed for severe pain. Reported on 12/03/2015   POLYETHYL GLYCOL-PROPYL GLYCOL (SYSTANE ULTRA) 0.4-0.3 % SOLN    Apply 1 drop to eye 4 (four) times daily. Both eyes   SENNOSIDES-DOCUSATE SODIUM (SENOKOT-S) 8.6-50 MG TABLET    Take 2 tablets by mouth 2 (two) times daily.   ZINC SULFATE 220 (50 ZN) MG CAPSULE    Take 220 mg by mouth daily.  Modified Medications   No medications on file  Discontinued Medications   No medications on file     SIGNIFICANT DIAGNOSTIC EXAMS   07-24-14: chest x-ray; right lower lobe atelectasis and mild cardiomegaly; no infiltrate no congestion    LABS REVIEWED:     05-17-15: wbc 5.6; hgb 10.3; hct 29.8; mcv 96.8; plt 184; glucose 104; bun 12; creat 0.78; k+ 4.3; na++141; liver normal albumin 3.0; cho 142; ldl 89; trig 83; hdl 36; tsh 1.036; hgb a1c 6.3; tegretol 7.5  08-11-15: hgb a1c  6.0; tegretol 7.6  10-27-15: wbc 6.6; hgb 10.3; hct 30.3; mcv  97.1; plt 271; glucose 83; bun 20; creat 0.99; k+ 4.4; na++142; ;liver normal albumin 3.4    Review of Systems Unable to perform ROS: Dementia     Physical Exam Constitutional: frail   Eyes: Conjunctivae are normal.  Neck: Neck supple. No JVD present. No thyromegaly present.  Cardiovascular: Normal rate, regular rhythm and intact distal pulses.   Respiratory: Effort normal and breath sounds normal. No respiratory distress. He has no wheezes.  GI: Soft. Bowel sounds are normal. He exhibits no distension. There is no tenderness.  Musculoskeletal: He exhibits no edema.  Has right side hemiparesis does have upper extremity contracture  Does not move voluntarily   Lymphadenopathy:    He has no cervical adenopathy.  Neurological:not aware of surroundings  Skin: has slight diaphoresis present.      ASSESSMENT/ PLAN:  1. Acute renal failure 2. Dehydration 3. Failure to thrive  Will give him 500 cc fluid bolus and will start D5 1/2 NS at 100 cc hour for total of 2 liters Will stop his lantus Will get cbc; cmp; ua/c&s  Will monitor his status.     Time spent with patient  45   minutes >50% time spent counseling; reviewing medical record; tests; labs; and developing future plan of care     The staff is unable to get an IV access; after talking with his family will send him to the ED for further evaluation and treatment options.    Synthia Innocenteborah Brooks Stotz NP [facility]  Contact [phone] Monday through Friday 8am- 5pm  After hours call [phone]

## 2015-12-06 NOTE — ED Notes (Signed)
IV team at the bedside. 

## 2015-12-06 NOTE — ED Provider Notes (Signed)
CSN: [id number]     Arrival date & time 12/06/15  1535 History   First MD Initiated Contact with Patient 12/06/15 1555     Chief Complaint  Patient presents with  . Failure To Thrive   Level 5 caveat due to nonverbal status  The history is provided by the patient.  patients wife states that he has not eaten in 5 days. No fevers. Baseline non-verbal after a stroke.   Past Medical History  Diagnosis Date  . Hyperlipidemia   . Depression   . Seizures (HCC)   . Hypokalemia   . Dementia   . Hemiplegia affecting dominant side, late effect of cerebrovascular disease   . Constipation   . Edema   . Diabetes mellitus without complication (HCC)   . Stroke (HCC)   . Hypertension   . CVA (cerebral vascular accident) (HCC) 08/28/2012   History reviewed. No pertinent past surgical history. Family History  Problem Relation Age of Onset  . Hypertension Other    Social History  Substance Use Topics  . Smoking status: Never Smoker   . Smokeless tobacco: Never Used  . Alcohol Use: No    Review of Systems  Unable to perform ROS: Patient nonverbal      Allergies  Review of patient's allergies indicates no known allergies.  Home Medications   Prior to Admission medications   Medication Sig Start Date End Date Taking? Authorizing Provider  acetaminophen (TYLENOL) 325 MG tablet Take 650 mg by mouth every 4 (four) hours as needed for mild pain or fever.   Yes Historical Provider, MD  baclofen (LIORESAL) 10 MG tablet Take 10 mg by mouth 2 (two) times daily.    Yes Historical Provider, MD  Brinzolamide-Brimonidine 1-0.2 % SUSP 1 gtt in both eyes BID Patient taking differently: Place 1 drop into both eyes 2 (two) times daily.  05/08/14  Yes Kirt Boys, DO  carbamazepine (CARBATROL) 300 MG 12 hr capsule Take 1 capsule (300 mg total) by mouth 2 (two) times daily. Patient taking differently: Take 300 mg by mouth 2 (two) times daily. Takes  ER in am,  reg. Release in afternoon, and  takes  ER in evening 05/08/14  Yes Kirt Boys, DO  carbamazepine (TEGRETOL) 200 MG tablet Take 200 mg by mouth See admin instructions. Takes  ER in am,  reg. Release in afternoon, and takes  ER in evening   Yes Historical Provider, MD  citalopram (CELEXA) 20 MG tablet Take 20 mg by mouth daily.   Yes Historical Provider, MD  clopidogrel (PLAVIX) 75 MG tablet Take 75 mg by mouth daily.   Yes Historical Provider, MD  food thickener (THICK IT) POWD Take 1 Container by mouth as needed (nectar thick). Nectar thick   Yes Historical Provider, MD  guaifenesin (ROBITUSSIN) 100 MG/5ML syrup Take 10 mLs by mouth every 6 (six) hours as needed for cough.   Yes Historical Provider, MD  Insulin Glargine (LANTUS SOLOSTAR) 100 UNIT/ML Solostar Pen Inject 5 Units into the skin every morning.    Yes Historical Provider, MD  lactose free nutrition (BOOST) LIQD Take 237 mLs by mouth 3 (three) times daily between meals.   Yes Historical Provider, MD  latanoprost (XALATAN) 0.005 % ophthalmic solution Place 1 drop into both eyes at bedtime.   Yes Historical Provider, MD  Menthol (CEPACOL SORE THROAT) 5.4 MG LOZG Use as directed 1 lozenge in the mouth or throat every 8 (eight) hours as needed.   Yes Historical Provider, MD  Multiple Vitamin (MULTIVITAMIN) tablet Take 1 tablet by mouth daily.   Yes Historical Provider, MD  oxyCODONE (OXY IR/ROXICODONE) 5 MG immediate release tablet Take 5 mg by mouth every 8 (eight) hours as needed for severe pain. Reported on 12/03/2015   Yes Historical Provider, MD  Polyethyl Glycol-Propyl Glycol (SYSTANE) 0.4-0.3 % SOLN Apply 1 drop to eye 4 (four) times daily.   Yes Historical Provider, MD  Protein POWD Take 2 scoop by mouth 3 (three) times daily with meals.   Yes Historical Provider, MD  sennosides-docusate sodium (SENOKOT-S) 8.6-50 MG tablet Take 2 tablets by mouth 2 (two) times daily. 04/22/15  Yes Sharee Holster, NP  zinc sulfate 220 (50 Zn) MG capsule Take 220 mg  by mouth every morning.    Yes Historical Provider, MD   BP 155/73 mmHg  Pulse 89  Temp(Src) 98.7 F (37.1 C) (Oral)  Resp 18  SpO2 99% Physical Exam  Constitutional: He appears well-developed.  HENT:  Head: Atraumatic.  Mouth is dry   Neck: Neck supple.  Cardiovascular: Normal rate.   Pulmonary/Chest: Effort normal.  Abdominal: There is no tenderness.  Musculoskeletal: He exhibits no edema.  Neurological:  Baseline nonverbal and bed bound   Skin: Skin is warm.    ED Course  Procedures (including critical care time) Labs Review Labs Reviewed  COMPREHENSIVE METABOLIC PANEL - Abnormal; Notable for the following:    Potassium 5.4 (*)    BUN 21 (*)    Total Bilirubin 1.4 (*)    All other components within normal limits  URINALYSIS, ROUTINE W REFLEX MICROSCOPIC (NOT AT [facility]) - Abnormal; Notable for the following:    Color, Urine AMBER (*)    APPearance CLOUDY (*)    Hgb urine dipstick LARGE (*)    Bilirubin Urine MODERATE (*)    Ketones, ur >80 (*)    Protein, ur 30 (*)    Nitrite POSITIVE (*)    Leukocytes, UA SMALL (*)    All other components within normal limits  CBC WITH DIFFERENTIAL/PLATELET - Abnormal; Notable for the following:    RBC 3.95 (*)    MCV 101.8 (*)    All other components within normal limits  URINE MICROSCOPIC-ADD ON - Abnormal; Notable for the following:    Squamous Epithelial / LPF 0-5 (*)    Bacteria, UA FEW (*)    All other components within normal limits  CBC - Abnormal; Notable for the following:    RBC 3.64 (*)    Hemoglobin 11.9 (*)    HCT 36.6 (*)    MCV 100.5 (*)    All other components within normal limits  CREATININE, SERUM - Abnormal; Notable for the following:    GFR calc non Af Amer 57 (*)    All other components within normal limits  MRSA PCR SCREENING  URINE CULTURE  GLUCOSE, CAPILLARY  CARBAMAZEPINE LEVEL, TOTAL  COMPREHENSIVE METABOLIC PANEL  CBC WITH DIFFERENTIAL/PLATELET    Imaging Review Dg Chest Portable 1  View  12/06/2015  CLINICAL DATA:  Weakness EXAM: PORTABLE CHEST 1 VIEW COMPARISON:  07/18/2013 FINDINGS: Cardiac shadow is within normal limits. The lungs are well aerated bilaterally. No focal infiltrate or sizable effusion is seen. Some stable changes in the left base consistent with scarring are noted. No bony abnormality is seen. IMPRESSION: No active disease. Electronically Signed   By: Alcide Clever M.D.   On: 12/06/2015 17:19   I have personally reviewed and evaluated these images and lab results as part  of my medical decision-making.   EKG Interpretation None      MDM   Final diagnoses:  UTI (lower urinary tract infection)  Dehydration    Patient has not eaten for 5 days. Rather dehydrated and likely urinary tract infection. Admit to internal medicine.     Benjiman CoreNathan Caitland Porchia, MD 12/07/15 [phone]

## 2015-12-06 NOTE — ED Notes (Signed)
Call pt's wife [name] [phone] or son, Darcel BayleyLeonard, [phone]) [phone] for questions/updates

## 2015-12-06 NOTE — H&P (Signed)
History and Physical    Danny Gilbert:[id number] DOB: 04/15/1924 DOA: 12/06/2015  PCP: Kirt Boysarter, Monica, DO  Patient coming from: Nursing home.  History obtained from patient's wife as patient is nonverbal from previous stroke.  Chief Complaint: Poor oral intake.  HPI: Danny Gilbert is a 82 y.o. male with stroke with right-sided hemiplegia nonverbal with sacral decubitus ulcers, seizure disorder, diabetes mellitus was brought in from the nursing home after patient was found to have poor oral intake over the last 3-4 days. Patient as per patient's wife did not have any nausea vomiting or diarrhea and has not had any fever or chills. In the ER abdomen appears benign. UA shows features consistent with UTI. Patient has been admitted for further observation and management.   ED Course: Patient was given fluids and started on ceftriaxone for UTI.  Review of Systems: As per HPI, rest all negative.   Past Medical History  Diagnosis Date  . Hyperlipidemia   . Depression   . Seizures (HCC)   . Hypokalemia   . Dementia   . Hemiplegia affecting dominant side, late effect of cerebrovascular disease   . Constipation   . Edema   . Diabetes mellitus without complication (HCC)   . Stroke (HCC)   . Hypertension   . CVA (cerebral vascular accident) (HCC) 08/28/2012    History reviewed. No pertinent past surgical history.   reports that he has never smoked. He has never used smokeless tobacco. He reports that he does not drink alcohol or use illicit drugs.  No Known Allergies  Family History  Problem Relation Age of Onset  . Hypertension Other     Prior to Admission medications   Medication Sig Start Date End Date Taking? Authorizing Provider  acetaminophen (TYLENOL) 325 MG tablet Take 650 mg by mouth every 4 (four) hours as needed for mild pain or fever.   Yes Historical Provider, MD  baclofen (LIORESAL) 10 MG tablet Take 10 mg by mouth 2 (two) times daily.    Yes Historical  Provider, MD  Brinzolamide-Brimonidine 1-0.2 % SUSP 1 gtt in both eyes BID Patient taking differently: Place 1 drop into both eyes 2 (two) times daily.  05/08/14  Yes Kirt BoysMonica Carter, DO  carbamazepine (CARBATROL) 300 MG 12 hr capsule Take 1 capsule (300 mg total) by mouth 2 (two) times daily. Patient taking differently: Take 300 mg by mouth 2 (two) times daily. Takes 300mg  ER in am, 200mg  reg. Release in afternoon, and takes 300mg  ER in evening 05/08/14  Yes Kirt BoysMonica Carter, DO  carbamazepine (TEGRETOL) 200 MG tablet Take 200 mg by mouth See admin instructions. Takes 300mg  ER in am, 200mg  reg. Release in afternoon, and takes 300mg  ER in evening   Yes Historical Provider, MD  citalopram (CELEXA) 20 MG tablet Take 20 mg by mouth daily.   Yes Historical Provider, MD  clopidogrel (PLAVIX) 75 MG tablet Take 75 mg by mouth daily.   Yes Historical Provider, MD  food thickener (THICK IT) POWD Take 1 Container by mouth as needed (nectar thick). Nectar thick   Yes Historical Provider, MD  guaifenesin (ROBITUSSIN) 100 MG/5ML syrup Take 10 mLs by mouth every 6 (six) hours as needed for cough.   Yes Historical Provider, MD  Insulin Glargine (LANTUS SOLOSTAR) 100 UNIT/ML Solostar Pen Inject 5 Units into the skin every morning.    Yes Historical Provider, MD  lactose free nutrition (BOOST) LIQD Take 237 mLs by mouth 3 (three) times daily between meals.  Yes Historical Provider, MD  latanoprost (XALATAN) 0.005 % ophthalmic solution Place 1 drop into both eyes at bedtime.   Yes Historical Provider, MD  Menthol (CEPACOL SORE THROAT) 5.4 MG LOZG Use as directed 1 lozenge in the mouth or throat every 8 (eight) hours as needed.   Yes Historical Provider, MD  Multiple Vitamin (MULTIVITAMIN) tablet Take 1 tablet by mouth daily.   Yes Historical Provider, MD  oxyCODONE (OXY IR/ROXICODONE) 5 MG immediate release tablet Take 5 mg by mouth every 8 (eight) hours as needed for severe pain. Reported on 12/03/2015   Yes Historical  Provider, MD  Polyethyl Glycol-Propyl Glycol (SYSTANE) 0.4-0.3 % SOLN Apply 1 drop to eye 4 (four) times daily.   Yes Historical Provider, MD  Protein POWD Take 2 scoop by mouth 3 (three) times daily with meals.   Yes Historical Provider, MD  sennosides-docusate sodium (SENOKOT-S) 8.6-50 MG tablet Take 2 tablets by mouth 2 (two) times daily. 04/22/15  Yes Sharee Holster, NP  zinc sulfate 220 (50 Zn) MG capsule Take 220 mg by mouth every morning.    Yes Historical Provider, MD    Physical Exam: Filed Vitals:   12/06/15 1930 12/06/15 2030 12/06/15 2100 12/06/15 2139  BP: 143/72 151/70 145/71 155/73  Pulse: 87 87 87 89  Temp:    98.7 F (37.1 C)  TempSrc:    Oral  Resp:  16 16 18   SpO2: 100% 99% 98% 99%      Constitutional: Not in distress. Filed Vitals:   12/06/15 1930 12/06/15 2030 12/06/15 2100 12/06/15 2139  BP: 143/72 151/70 145/71 155/73  Pulse: 87 87 87 89  Temp:    98.7 F (37.1 C)  TempSrc:    Oral  Resp:  16 16 18   SpO2: 100% 99% 98% 99%   Eyes: Anicteric no pallor. ENMT: No discharge from the ears eyes nose or mouth. Neck: No mass felt. No neck rigidity. Respiratory: No rhonchi or crepitations. Cardiovascular: S1 and S2 heard. Abdomen: Soft nontender bowel sounds present. Musculoskeletal: No edema. Skin: Sacral decubitus. Neurologic: Alert awake moves extremities but has right-sided hemiplegia from previous stroke and patient is nonverbal. Left facial droop. Psychiatric: Patient is nonverbal.   Labs on Admission: I have personally reviewed following labs and imaging studies  CBC:  Recent Labs Lab 12/06/15 1704  WBC 5.2  NEUTROABS 2.3  HGB 13.1  HCT 40.2  MCV 101.8*  PLT 207   Basic Metabolic Panel:  Recent Labs Lab 12/06/15 1704  NA 142  K 5.4*  CL 105  CO2 23  GLUCOSE 68  BUN 21*  CREATININE 1.01  CALCIUM 9.4   GFR: Estimated Creatinine Clearance: 44.9 mL/min (by C-G formula based on Cr of 1.01). Liver Function Tests:  Recent  Labs Lab 12/06/15 1704  AST 32  ALT 26  ALKPHOS 111  BILITOT 1.4*  PROT 7.5  ALBUMIN 3.5   No results for input(s): LIPASE, AMYLASE in the last 168 hours. No results for input(s): AMMONIA in the last 168 hours. Coagulation Profile: No results for input(s): INR, PROTIME in the last 168 hours. Cardiac Enzymes: No results for input(s): CKTOTAL, CKMB, CKMBINDEX, TROPONINI in the last 168 hours. BNP (last 3 results) No results for input(s): PROBNP in the last 8760 hours. HbA1C: No results for input(s): HGBA1C in the last 72 hours. CBG: No results for input(s): GLUCAP in the last 168 hours. Lipid Profile: No results for input(s): CHOL, HDL, LDLCALC, TRIG, CHOLHDL, LDLDIRECT in the last 72 hours.  Thyroid Function Tests: No results for input(s): TSH, T4TOTAL, FREET4, T3FREE, THYROIDAB in the last 72 hours. Anemia Panel: No results for input(s): VITAMINB12, FOLATE, FERRITIN, TIBC, IRON, RETICCTPCT in the last 72 hours. Urine analysis:    Component Value Date/Time   COLORURINE AMBER* 12/06/2015 1824   APPEARANCEUR CLOUDY* 12/06/2015 1824   LABSPEC 1.029 12/06/2015 1824   PHURINE 5.5 12/06/2015 1824   GLUCOSEU NEGATIVE 12/06/2015 1824   HGBUR LARGE* 12/06/2015 1824   BILIRUBINUR MODERATE* 12/06/2015 1824   KETONESUR >80* 12/06/2015 1824   PROTEINUR 30* 12/06/2015 1824   UROBILINOGEN 0.2 03/12/2010 1430   NITRITE POSITIVE* 12/06/2015 1824   LEUKOCYTESUR SMALL* 12/06/2015 1824   Sepsis Labs: (procalcitonin:4,lacticidven:4) )No results found for this or any previous visit (from the past 240 hour(s)).   Radiological Exams on Admission: Dg Chest Portable 1 View  12/06/2015  CLINICAL DATA:  Weakness EXAM: PORTABLE CHEST 1 VIEW COMPARISON:  07/18/2013 FINDINGS: Cardiac shadow is within normal limits. The lungs are well aerated bilaterally. No focal infiltrate or sizable effusion is seen. Some stable changes in the left base consistent with scarring are noted. No bony  abnormality is seen. IMPRESSION: No active disease. Electronically Signed   By: Alcide Clever M.D.   On: 12/06/2015 17:19     Assessment/Plan Principal Problem:   Failure to thrive in adult Active Problems:   Seizures (HCC)   History of stroke with residual deficit   DM (diabetes mellitus) type II controlled, neurological manifestation (HCC)   UTI (lower urinary tract infection)    1. Failure to thrive with poor oral intake - suspect most likely secondary UTI for which patient has been placed on ceftriaxone. Check CT abdomen. I have requested swallow team evaluation. 2. UTI - patient has been placed on ceftriaxone. Follow urine cultures. 3. Diabetes mellitus type 2 - patient's blood sugars in the low normal. Holding off Lantus for now and patient has been placed on D5 half-normal saline. Closely follow CBGs. 4. Seizure disorder - since patient is nothing by mouth for now until swallow evaluation I have discussed with on-call neurologist Dr. Roseanne Reno who advised okay to keep patient on IV Keppra until patient can take Tegretol orally. Check Tegretol levels. 5. History of CVA with right-sided hemiplegia - will be on perectal aspirin and the patient can take oral Plavix. 6. Sacral decubitus ulcer - wound team consult.  Patient has enlarged scrotum which patient's wife states has been for some time.   DVT prophylaxis: Lovenox. Code Status: DO NOT RESUSCITATE.  Family Communication: Patient's wife.  Disposition Plan: Skilled nursing facility.  Consults called: None.  Admission status: Observation.    Eduard Clos MD [facility] Pager [phone].  If 7PM-7AM, please contact night-coverage [email or web address] Password [email or web address]  12/06/2015, 10:41 PM

## 2015-12-06 NOTE — ED Notes (Signed)
Per [facility] No appetite x 2-3 days. Wife wanted pt to be evaluated. VSS. Nonverbal baseline. NAD.

## 2015-12-07 DIAGNOSIS — G309 Alzheimer's disease, unspecified: Secondary | ICD-10-CM | POA: Diagnosis present

## 2015-12-07 DIAGNOSIS — Z794 Long term (current) use of insulin: Secondary | ICD-10-CM | POA: Diagnosis not present

## 2015-12-07 DIAGNOSIS — N390 Urinary tract infection, site not specified: Secondary | ICD-10-CM | POA: Diagnosis not present

## 2015-12-07 DIAGNOSIS — G40909 Epilepsy, unspecified, not intractable, without status epilepticus: Secondary | ICD-10-CM | POA: Diagnosis present

## 2015-12-07 DIAGNOSIS — F0150 Vascular dementia without behavioral disturbance: Secondary | ICD-10-CM | POA: Diagnosis present

## 2015-12-07 DIAGNOSIS — Z7401 Bed confinement status: Secondary | ICD-10-CM | POA: Diagnosis not present

## 2015-12-07 DIAGNOSIS — R1310 Dysphagia, unspecified: Secondary | ICD-10-CM | POA: Diagnosis not present

## 2015-12-07 DIAGNOSIS — E860 Dehydration: Secondary | ICD-10-CM | POA: Diagnosis not present

## 2015-12-07 DIAGNOSIS — L8990 Pressure ulcer of unspecified site, unspecified stage: Secondary | ICD-10-CM | POA: Diagnosis not present

## 2015-12-07 DIAGNOSIS — E785 Hyperlipidemia, unspecified: Secondary | ICD-10-CM | POA: Diagnosis present

## 2015-12-07 DIAGNOSIS — Z7902 Long term (current) use of antithrombotics/antiplatelets: Secondary | ICD-10-CM | POA: Diagnosis not present

## 2015-12-07 DIAGNOSIS — I6930 Unspecified sequelae of cerebral infarction: Secondary | ICD-10-CM | POA: Diagnosis not present

## 2015-12-07 DIAGNOSIS — F0280 Dementia in other diseases classified elsewhere without behavioral disturbance: Secondary | ICD-10-CM | POA: Diagnosis present

## 2015-12-07 DIAGNOSIS — I69351 Hemiplegia and hemiparesis following cerebral infarction affecting right dominant side: Secondary | ICD-10-CM | POA: Diagnosis not present

## 2015-12-07 DIAGNOSIS — E1140 Type 2 diabetes mellitus with diabetic neuropathy, unspecified: Secondary | ICD-10-CM | POA: Diagnosis not present

## 2015-12-07 DIAGNOSIS — R627 Adult failure to thrive: Secondary | ICD-10-CM | POA: Diagnosis not present

## 2015-12-07 DIAGNOSIS — Z8249 Family history of ischemic heart disease and other diseases of the circulatory system: Secondary | ICD-10-CM | POA: Diagnosis not present

## 2015-12-07 DIAGNOSIS — L89159 Pressure ulcer of sacral region, unspecified stage: Secondary | ICD-10-CM | POA: Diagnosis present

## 2015-12-07 DIAGNOSIS — Z66 Do not resuscitate: Secondary | ICD-10-CM | POA: Diagnosis present

## 2015-12-07 DIAGNOSIS — Z515 Encounter for palliative care: Secondary | ICD-10-CM | POA: Diagnosis not present

## 2015-12-07 DIAGNOSIS — R531 Weakness: Secondary | ICD-10-CM | POA: Diagnosis not present

## 2015-12-07 DIAGNOSIS — Z79899 Other long term (current) drug therapy: Secondary | ICD-10-CM | POA: Diagnosis not present

## 2015-12-07 DIAGNOSIS — F329 Major depressive disorder, single episode, unspecified: Secondary | ICD-10-CM | POA: Diagnosis present

## 2015-12-07 DIAGNOSIS — I10 Essential (primary) hypertension: Secondary | ICD-10-CM | POA: Diagnosis present

## 2015-12-07 DIAGNOSIS — T148XXA Other injury of unspecified body region, initial encounter: Secondary | ICD-10-CM | POA: Insufficient documentation

## 2015-12-07 DIAGNOSIS — E1149 Type 2 diabetes mellitus with other diabetic neurological complication: Secondary | ICD-10-CM | POA: Diagnosis present

## 2015-12-07 DIAGNOSIS — Z79891 Long term (current) use of opiate analgesic: Secondary | ICD-10-CM | POA: Diagnosis not present

## 2015-12-07 LAB — CBC WITH DIFFERENTIAL/PLATELET
BASOS ABS: 0 10*3/uL (ref 0.0–0.1)
Basophils Relative: 0 %
Eosinophils Absolute: 0.3 10*3/uL (ref 0.0–0.7)
Eosinophils Relative: 5 %
HEMATOCRIT: 34.5 % — AB (ref 39.0–52.0)
Hemoglobin: 11.3 g/dL — ABNORMAL LOW (ref 13.0–17.0)
LYMPHS PCT: 36 %
Lymphs Abs: 2.2 10*3/uL (ref 0.7–4.0)
MCH: 32.8 pg (ref 26.0–34.0)
MCHC: 32.8 g/dL (ref 30.0–36.0)
MCV: 100 fL (ref 78.0–100.0)
Monocytes Absolute: 0.9 10*3/uL (ref 0.1–1.0)
Monocytes Relative: 14 %
NEUTROS ABS: 2.7 10*3/uL (ref 1.7–7.7)
Neutrophils Relative %: 45 %
PLATELETS: 200 10*3/uL (ref 150–400)
RBC: 3.45 MIL/uL — AB (ref 4.22–5.81)
RDW: 13 % (ref 11.5–15.5)
WBC: 6.1 10*3/uL (ref 4.0–10.5)

## 2015-12-07 LAB — COMPREHENSIVE METABOLIC PANEL
ALBUMIN: 3.1 g/dL — AB (ref 3.5–5.0)
ALT: 22 U/L (ref 17–63)
ANION GAP: 12 (ref 5–15)
AST: 21 U/L (ref 15–41)
Alkaline Phosphatase: 103 U/L (ref 38–126)
BILIRUBIN TOTAL: 1.2 mg/dL (ref 0.3–1.2)
BUN: 20 mg/dL (ref 6–20)
CO2: 22 mmol/L (ref 22–32)
Calcium: 8.9 mg/dL (ref 8.9–10.3)
Chloride: 108 mmol/L (ref 101–111)
Creatinine, Ser: 0.98 mg/dL (ref 0.61–1.24)
GFR calc Af Amer: >60 mL/min (ref >60)
Glucose, Bld: 112 mg/dL — ABNORMAL HIGH (ref 65–99)
POTASSIUM: 4.4 mmol/L (ref 3.5–5.1)
Sodium: 142 mmol/L (ref 135–145)
TOTAL PROTEIN: 6.8 g/dL (ref 6.5–8.1)

## 2015-12-07 LAB — GLUCOSE, CAPILLARY
GLUCOSE-CAPILLARY: 112 mg/dL — AB (ref 65–99)
GLUCOSE-CAPILLARY: 126 mg/dL — AB (ref 65–99)
GLUCOSE-CAPILLARY: 119 mg/dL — AB (ref 65–99)
Glucose-Capillary: 102 mg/dL — ABNORMAL HIGH (ref 65–99)
Glucose-Capillary: 137 mg/dL — ABNORMAL HIGH (ref 65–99)
Glucose-Capillary: 79 mg/dL (ref 65–99)

## 2015-12-07 LAB — CREATININE, SERUM
CREATININE: 1.1 mg/dL (ref 0.61–1.24)
GFR calc Af Amer: >60 mL/min (ref >60)
GFR calc non Af Amer: 57 mL/min — ABNORMAL LOW (ref >60)

## 2015-12-07 LAB — CARBAMAZEPINE LEVEL, TOTAL: Carbamazepine Lvl: 3.1 ug/mL — ABNORMAL LOW (ref 4.0–12.0)

## 2015-12-07 LAB — MRSA PCR SCREENING: MRSA by PCR: NEGATIVE

## 2015-12-07 MED ORDER — DEXTROSE-NACL 5-0.45 % IV SOLN
INTRAVENOUS | Status: DC
  Administered 2015-12-07 – 2015-12-08 (×2): via INTRAVENOUS

## 2015-12-07 MED ORDER — CETYLPYRIDINIUM CHLORIDE 0.05 % MT LIQD
7.0000 mL | Freq: Two times a day (BID) | OROMUCOSAL | Status: DC
  Administered 2015-12-07 – 2015-12-09 (×5): 7 mL via OROMUCOSAL

## 2015-12-07 NOTE — Progress Notes (Signed)
[facility] PROGRESS NOTE  Danny Gilbert [name]:[id number] DOB: Sep 02, 1923 DOA: 12/06/2015   PCP: Kirt Boys, DO     Assessment/Plan: Principal Problem:   Failure to thrive in adult Active Problems:   Seizures (HCC)   History of stroke with residual deficit   DM (diabetes mellitus) type II controlled, neurological manifestation (HCC)   UTI (lower urinary tract infection)   Pressure ulcer   82 y.o. male with stroke with right-sided hemiplegia nonverbal with sacral decubitus ulcers, seizure disorder, diabetes mellitus was brought in from the nursing home after patient was found to have poor oral intake over the last 3-4 days. Patient as per patient's wife did not have any nausea vomiting or diarrhea and has not had any fever or chills. In the ER abdomen appears benign. UA shows features consistent with UTI. Patient has been admitted for further observation and management.   ED Course: Patient was given fluids and started on ceftriaxone for UTI.  Assessment and plan 1. Failure to thrive with poor oral intake - suspect most likely secondary UTI for which patient has been placed on ceftriaxone. Follow results of urine culture 2. UTI - patient has been placed on ceftriaxone. Follow urine cultures. 3. Diabetes mellitus type 2 - patient's blood sugars in the low normal. Holding off Lantus for now and patient has been placed on D5 half-normal saline. Closely follow CBGs. 4. Seizure disorder - since patient is nothing by mouth for now until swallow evaluation I have discussed with on-call neurologist Dr. Roseanne Reno who advised okay to keep patient on IV Keppra until patient can take Tegretol orally. Check Tegretol levels. Speech therapy evaluation 5. History of CVA with right-sided hemiplegia - will be on per rectal aspirin and the patient can take oral Plavix. 6. Sacral decubitus ulcer - wound team consult. See note 7. Depression: is stable does receive benefit from celexa 20 mg  daily will monitor his status 8. Mixed Alzheimer's and vascular dementia: he is having a slow progressive decline. He is not verbal nearly all of the time. is presently not taking medications;    palliative care consultation for goals of care, MOST forms     DVT prophylaxsis  Lovenox  Code Status:  DO NOT RESUSCITATE     Family Communication: Discussed in detail with the patient, all imaging results, lab results explained to the patient   Disposition Plan:  Anticipate discharge in one to 2 days     Consultants:  Palliative care  Procedures:  None  Antibiotics: Anti-infectives    Start     Dose/Rate Route Frequency Ordered Stop   12/07/15 2000  cefTRIAXone (ROCEPHIN) 1 g in dextrose 5 % 50 mL IVPB     1 g 100 mL/hr over 30 Minutes Intravenous Every 24 hours 12/06/15 2241     12/06/15 1945  cefTRIAXone (ROCEPHIN) 1 g in dextrose 5 % 50 mL IVPB     1 g 100 mL/hr over 30 Minutes Intravenous  Once 12/06/15 1938 12/06/15 2112         HPI/Subjective: Patient not conversive, doesn't answer questions   Objective: Filed Vitals:   12/06/15 2030 12/06/15 2100 12/06/15 2139 12/07/15 0503  BP: 151/70 145/71 155/73 157/84  Pulse: 87 87 89 101  Temp:   98.7 F (37.1 C) 98.1 F (36.7 C)  TempSrc:   Oral Oral  Resp: 16 16 18 16   Height:    5\' 10"  (1.778 m)  Weight:    64.4  kg (141 lb 15.6 oz)  SpO2: 99% 98% 99% 96%    Intake/Output Summary (Last 24 hours) at 12/07/15 0950 Last data filed at 12/07/15 0600  Gross per 24 hour  Intake    485 ml  Output      0 ml  Net    485 ml    Exam:  Examination:  General exam: confused   Respiratory system: Clear to auscultation. Respiratory effort normal. Cardiovascular system: S1 & S2 heard, RRR. No JVD, murmurs, rubs, gallops or clicks. No pedal edema. Gastrointestinal system: Abdomen is nondistended, soft and nontender. No organomegaly or masses felt. Normal bowel sounds heard. Central nervous system: unable to assess   Extremities: Symmetric 5 x 5 power. Skin: No rashes, lesions or ulcers Psychiatry: Judgement and insight impaired .     Data Reviewed: I have personally reviewed following labs and imaging studies  Micro Results Recent Results (from the past 240 hour(s))  MRSA PCR Screening     Status: None   Collection Time: 12/06/15  9:44 PM  Result Value Ref Range Status   MRSA by PCR NEGATIVE NEGATIVE Final    Comment:        The GeneXpert MRSA Assay (FDA approved for NASAL specimens only), is one component of a comprehensive MRSA colonization surveillance program. It is not intended to diagnose MRSA infection nor to guide or monitor treatment for MRSA infections.     Radiology Reports Dg Chest Portable 1 View  12/06/2015  CLINICAL DATA:  Weakness EXAM: PORTABLE CHEST 1 VIEW COMPARISON:  07/18/2013 FINDINGS: Cardiac shadow is within normal limits. The lungs are well aerated bilaterally. No focal infiltrate or sizable effusion is seen. Some stable changes in the left base consistent with scarring are noted. No bony abnormality is seen. IMPRESSION: No active disease. Electronically Signed   By: Alcide CleverMark  Lukens M.D.   On: 12/06/2015 17:19     CBC  Recent Labs Lab 12/06/15 1704 12/06/15 2340 12/07/15 0731  WBC 5.2 5.9 6.1  HGB 13.1 11.9* 11.3*  HCT 40.2 36.6* 34.5*  PLT 207 208 200  MCV 101.8* 100.5* 100.0  MCH 33.2 32.7 32.8  MCHC 32.6 32.5 32.8  RDW 13.2 13.0 13.0  LYMPHSABS 2.0  --  2.2  MONOABS 0.7  --  0.9  EOSABS 0.2  --  0.3  BASOSABS 0.0  --  0.0    Chemistries   Recent Labs Lab 12/06/15 1704 12/06/15 2340 12/07/15 0731  NA 142  --  142  K 5.4*  --  4.4  CL 105  --  108  CO2 23  --  22  GLUCOSE 68  --  112*  BUN 21*  --  20  CREATININE 1.01 1.10 0.98  CALCIUM 9.4  --  8.9  AST 32  --  21  ALT 26  --  22  ALKPHOS 111  --  103  BILITOT 1.4*  --  1.2    ------------------------------------------------------------------------------------------------------------------ estimated creatinine clearance is 46.5 mL/min (by C-G formula based on Cr of 0.98). ------------------------------------------------------------------------------------------------------------------ No results for input(s): HGBA1C in the last 72 hours. ------------------------------------------------------------------------------------------------------------------ No results for input(s): CHOL, HDL, LDLCALC, TRIG, CHOLHDL, LDLDIRECT in the last 72 hours. ------------------------------------------------------------------------------------------------------------------ No results for input(s): TSH, T4TOTAL, T3FREE, THYROIDAB in the last 72 hours.  Invalid input(s): FREET3 ------------------------------------------------------------------------------------------------------------------ No results for input(s): VITAMINB12, FOLATE, FERRITIN, TIBC, IRON, RETICCTPCT in the last 72 hours.  Coagulation profile No results for input(s): INR, PROTIME in the last 168 hours.  No results for  input(s): DDIMER in the last 72 hours.  Cardiac Enzymes No results for input(s): CKMB, TROPONINI, MYOGLOBIN in the last 168 hours.  Invalid input(s): CK ------------------------------------------------------------------------------------------------------------------ Invalid input(s): POCBNP   CBG:  Recent Labs Lab 12/07/15 0023 12/07/15 0500 12/07/15 0754  GLUCAP 79 102* 112*       Studies: Dg Chest Portable 1 View  12/06/2015  CLINICAL DATA:  Weakness EXAM: PORTABLE CHEST 1 VIEW COMPARISON:  07/18/2013 FINDINGS: Cardiac shadow is within normal limits. The lungs are well aerated bilaterally. No focal infiltrate or sizable effusion is seen. Some stable changes in the left base consistent with scarring are noted. No bony abnormality is seen. IMPRESSION: No active disease. Electronically  Signed   By: Alcide Clever M.D.   On: 12/06/2015 17:19      Lab Results  Component Value Date   HGBA1C 6.0 08/11/2015   HGBA1C 6.3* 07/28/2014   HGBA1C 10.7* 07/14/2013   Lab Results  Component Value Date   LDLCALC 110* 02/25/2014   CREATININE 0.98 12/07/2015       Scheduled Meds: . antiseptic oral rinse  7 mL Mouth Rinse BID  . cefTRIAXone (ROCEPHIN)  IV  1 g Intravenous Q24H  . enoxaparin (LOVENOX) injection  40 mg Subcutaneous Daily  . insulin aspart  0-9 Units Subcutaneous Q4H  . latanoprost  1 drop Both Eyes QHS  . levETIRAcetam  500 mg Intravenous BID   Continuous Infusions: . dextrose 5 % and 0.45% NaCl 75 mL/hr at 12/07/15 0056        Time spent: >30 MINS    [facility]  [facility] Pager [phone]. If 7PM-7AM, please contact night-coverage at [email or web address], password [email or web address] 12/07/2015, 9:50 AM

## 2015-12-07 NOTE — [place] FL2 (Signed)
[place] MEDICAID FL2 LEVEL OF CARE SCREENING TOOL     IDENTIFICATION  Patient Name: Danny Gilbert Birthdate: 05/11/1924 Sex: male Admission Date (Current Location): 12/06/2015  [facility] [place]   Facility and Address:  [facility], [address], [place], [place] [address]      Provider Number: [id number]  Attending Physician Name and Address:  Richarda OverlieNayana Abrol, MD  Relative Name and Phone Number:  Shanon AceVashti, spouse, [phone]    Current Level of Care: Hospital Recommended Level of Care: Skilled Nursing Facility Prior Approval Number:    Date Approved/Denied:   PASRR Number:    Discharge Plan: SNF    Current Diagnoses: Patient Active Problem List   Diagnosis Date Noted  . Pressure ulcer 12/07/2015  . Acute renal failure (HCC) 12/06/2015  . Dehydration 12/06/2015  . Failure to thrive in adult 12/06/2015  . UTI (lower urinary tract infection) 12/06/2015  . Loss of weight 08/11/2015  . DM (diabetes mellitus) type II controlled, neurological manifestation (HCC) 07/13/2014  . Depression due to dementia 11/17/2013  . Dysphagia, oropharyngeal phase 07/24/2013  . Mixed Alzheimer's and vascular dementia 01/10/2013  . Hemiplegia of dominant side as late effect following cerebrovascular disease (HCC) 08/28/2012  . Seizures (HCC) 08/28/2012  . Constipation 08/28/2012  . History of stroke with residual deficit 08/28/2012    Orientation RESPIRATION BLADDER Height & Weight      (Disoriented)  Normal Incontinent Weight: 64.4 kg (141 lb 15.6 oz) Height:  5\' 10"  (177.8 cm)  BEHAVIORAL SYMPTOMS/MOOD NEUROLOGICAL BOWEL NUTRITION STATUS      Continent Diet (Please see DC Summary)  AMBULATORY STATUS COMMUNICATION OF NEEDS Skin   Extensive Assist Does not communicate ([place]) PU Stage and Appropriate Care (Stage II PU on buttocks and sacrum)                       Personal Care Assistance Level of Assistance  Bathing, Feeding,  Dressing Bathing Assistance: Maximum assistance Feeding assistance: Maximum assistance Dressing Assistance: Maximum assistance     Functional Limitations Info             SPECIAL CARE FACTORS FREQUENCY                       Contractures      Additional Factors Info  Code Status, Allergies, Insulin Sliding Scale Code Status Info: DNR Allergies Info: NKA   Insulin Sliding Scale Info: insulin aspart (novoLOG) injection 0-9 Units       Current Medications (12/07/2015):  This is the current hospital active medication list Current Facility-Administered Medications  Medication Dose Route Frequency Provider Last Rate Last Dose  . acetaminophen (TYLENOL) tablet 650 mg  650 mg Oral Q6H PRN Eduard ClosArshad N Kakrakandy, MD       Or  . acetaminophen (TYLENOL) suppository 650 mg  650 mg Rectal Q6H PRN Eduard ClosArshad N Kakrakandy, MD      . antiseptic oral rinse (CPC / CETYLPYRIDINIUM CHLORIDE 0.05%) solution 7 mL  7 mL Mouth Rinse BID Richarda OverlieNayana Abrol, MD   7 mL at 12/07/15 0948  . cefTRIAXone (ROCEPHIN) 1 g in dextrose 5 % 50 mL IVPB  1 g Intravenous Q24H Eduard ClosArshad N Kakrakandy, MD      . dextrose 5 %-0.45 % sodium chloride infusion   Intravenous Continuous Eduard ClosArshad N Kakrakandy, MD 75 mL/hr at 12/07/15 0056    . enoxaparin (LOVENOX) injection 40 mg  40 mg Subcutaneous  Daily Eduard Clos, MD   40 mg at 12/07/15 0948  . insulin aspart (novoLOG) injection 0-9 Units  0-9 Units Subcutaneous Q4H Eduard Clos, MD   0 Units at 12/07/15 0031  . latanoprost (XALATAN) 0.005 % ophthalmic solution 1 drop  1 drop Both Eyes QHS Eduard Clos, MD   1 drop at 12/07/15 0042  . levETIRAcetam (KEPPRA) 500 mg in sodium chloride 0.9 % 100 mL IVPB  500 mg Intravenous BID Eduard Clos, MD   500 mg at 12/07/15 0948  . ondansetron (ZOFRAN) tablet 4 mg  4 mg Oral Q6H PRN Eduard Clos, MD       Or  . ondansetron [facility]) injection 4 mg  4 mg Intravenous Q6H PRN Eduard Clos, MD          Discharge Medications: Please see discharge summary for a list of discharge medications.  Relevant Imaging Results:  Relevant Lab Results:   Additional Information SSN: [id number] [place], [place]

## 2015-12-07 NOTE — Consult Note (Signed)
Consultation Note Date: 12/07/2015   Patient Name: Danny Gilbert  DOB: 24-Jun-1923  MRN: [id number]  Age / Sex: 82 y.o., male  PCP: Gildardo Cranker, DO Referring Physician: Reyne Dumas, MD  Reason for Consultation: Establishing goals of care  HPI/Patient Profile: 82 y.o. male  with past medical history of stroke with right-sided hemiplegia, nonverbal, with sacral decubitus ulcers, seizure disorder, diabetes mellitus was brought in from the nursing home after patient was found to have poor oral intake over the last 3-4 days.  Admitted on 12/06/2015 with UTI.   Clinical Assessment and Goals of Care: I met today with Danny Gilbert (nonverbal with dementia - unable to participate) and his wife and her son at bedside. We had a long conversation regarding the natural progression of dementia and signs/symptoms of worsening disease as well as complications. They recognize his worsening QOL and tell me that he was extremely active and would fix and do anything for anyone at anytime - but this all changed in 2011 after his stroke and he has been in SNF since.   Son brought up feeding tube and nutrition. I explain that feeding tube is not recommended with dementia. Wife says that she has discussed this before and agrees that this would not be a good option. I also took this opportunity to explain the importance of making decisions based on QOL and with expectations of outcomes. Also explained signs at EOL.   They express their goals as a focus on comfort, quality care keeping him clean and groomed/turned/dry, and maintaining his dignity and respect. All questions concerns addressed. Emotional support provided. Awaiting SLP and then will assess if he is able to maintain adequate intake - discussed these next steps with family and will f/u tomorrow. We also briefly discussed hospice (he has had hospice care before) and will readdress  depending on how he does.   NEXT OF KIN wife    Code Status/Advance Care Planning:  DNR   Symptom Management:   No current symptoms. No signs of pain/discomfort.   Treat UTI.   Await SLP evaluation. Previously on dysphagia I, nectar thick.   Palliative Prophylaxis:   Aspiration, Delirium Protocol, Oral Care and Turn Reposition  Additional Recommendations (Limitations, Scope, Preferences):  Avoid Hospitalization and No Artificial Feeding  Psycho-social/Spiritual:   Desire for further Chaplaincy support:yes  Additional Recommendations: Caregiving  Support/Resources  Prognosis:   Unable to determine  Discharge Planning: [place] for rehab with Palliative care service follow-up      Primary Diagnoses: Present on Admission:  . UTI (lower urinary tract infection) . DM (diabetes mellitus) type II controlled, neurological manifestation ([place]) . Failure to thrive in adult  I have reviewed the medical record, interviewed the patient and family, and examined the patient. The following aspects are pertinent.  Past Medical History  Diagnosis Date  . Hyperlipidemia   . Depression   . Seizures ([place])   . Hypokalemia   . Dementia   . Hemiplegia affecting dominant side, late effect of cerebrovascular disease   .  Constipation   . Edema   . Diabetes mellitus without complication ([place])   . Stroke ([place])   . Hypertension   . CVA (cerebral vascular accident) ([place]) 08/28/2012   Social History   Social History  . Marital Status: Married    Spouse Name: N/A  . Number of Children: N/A  . Years of Education: N/A   Social History Main Topics  . Smoking status: Never Smoker   . Smokeless tobacco: Never Used  . Alcohol Use: No  . Drug Use: No  . Sexual Activity: No   Other Topics Concern  . None   Social History Narrative   Family History  Problem Relation Age of Onset  . Hypertension Other    Scheduled Meds: . antiseptic oral rinse  7 mL Mouth  Rinse BID  . cefTRIAXone (ROCEPHIN)  IV  1 g Intravenous Q24H  . enoxaparin (LOVENOX) injection  40 mg Subcutaneous Daily  . insulin aspart  0-9 Units Subcutaneous Q4H  . latanoprost  1 drop Both Eyes QHS  . levETIRAcetam  500 mg Intravenous BID   Continuous Infusions: . dextrose 5 % and 0.45% NaCl 75 mL/hr at 12/07/15 0056   PRN Meds:.acetaminophen **OR** acetaminophen, ondansetron **OR** ondansetron (ZOFRAN) IV Medications Prior to Admission:  Prior to Admission medications   Medication Sig Start Date End Date Taking? Authorizing Provider  acetaminophen (TYLENOL) 325 MG tablet Take 650 mg by mouth every 4 (four) hours as needed for mild pain or fever.   Yes Historical Provider, MD  baclofen (LIORESAL) 10 MG tablet Take 10 mg by mouth 2 (two) times daily.    Yes Historical Provider, MD  Brinzolamide-Brimonidine 1-0.2 % SUSP 1 gtt in both eyes BID Patient taking differently: Place 1 drop into both eyes 2 (two) times daily.  05/08/14  Yes Gildardo Cranker, DO  carbamazepine (CARBATROL) 300 MG 12 hr capsule Take 1 capsule (300 mg total) by mouth 2 (two) times daily. Patient taking differently: Take 300 mg by mouth 2 (two) times daily. Takes 338m ER in am, 2064mreg. Release in afternoon, and takes 30064mR in evening 05/08/14  Yes MonGildardo CrankerO  carbamazepine (TEGRETOL) 200 MG tablet Take 200 mg by mouth See admin instructions. Takes 300m31m in am, 200mg28m. Release in afternoon, and takes 300mg 1mn evening   Yes Historical Provider, MD  citalopram (CELEXA) 20 MG tablet Take 20 mg by mouth daily.   Yes Historical Provider, MD  clopidogrel (PLAVIX) 75 MG tablet Take 75 mg by mouth daily.   Yes Historical Provider, MD  food thickener (THICK IT) POWD Take 1 Container by mouth as needed (nectar thick). Nectar thick   Yes Historical Provider, MD  guaifenesin (ROBITUSSIN) 100 MG/5ML syrup Take 10 mLs by mouth every 6 (six) hours as needed for cough.   Yes Historical Provider, MD  Insulin  Glargine (LANTUS SOLOSTAR) 100 UNIT/ML Solostar Pen Inject 5 Units into the skin every morning.    Yes Historical Provider, MD  lactose free nutrition (BOOST) LIQD Take 237 mLs by mouth 3 (three) times daily between meals.   Yes Historical Provider, MD  latanoprost (XALATAN) 0.005 % ophthalmic solution Place 1 drop into both eyes at bedtime.   Yes Historical Provider, MD  Menthol (CEPACOL SORE THROAT) 5.4 MG LOZG Use as directed 1 lozenge in the mouth or throat every 8 (eight) hours as needed.   Yes Historical Provider, MD  Multiple Vitamin (MULTIVITAMIN) tablet Take 1 tablet by mouth daily.  Yes Historical Provider, MD  oxyCODONE (OXY IR/ROXICODONE) 5 MG immediate release tablet Take 5 mg by mouth every 8 (eight) hours as needed for severe pain. Reported on 12/03/2015   Yes Historical Provider, MD  Polyethyl Glycol-Propyl Glycol (SYSTANE) 0.4-0.3 % SOLN Apply 1 drop to eye 4 (four) times daily.   Yes Historical Provider, MD  Protein POWD Take 2 scoop by mouth 3 (three) times daily with meals.   Yes Historical Provider, MD  sennosides-docusate sodium (SENOKOT-S) 8.6-50 MG tablet Take 2 tablets by mouth 2 (two) times daily. 04/22/15  Yes Gerlene Fee, NP  zinc sulfate 220 (50 Zn) MG capsule Take 220 mg by mouth every morning.    Yes Historical Provider, MD   No Known Allergies Review of Systems  Unable to perform ROS   Physical Exam  Constitutional: He appears well-developed.  Thin, frail  HENT:  Head: Normocephalic and atraumatic.  Cardiovascular: Normal rate.   Pulmonary/Chest: Effort normal. No accessory muscle usage. No tachypnea. No respiratory distress.  Abdominal: Soft. Normal appearance.  Neurological: He is alert. He is disoriented.  Nonverbal but will nod head to questions    Vital Signs: BP 146/59 mmHg  Pulse 79  Temp(Src) 97.8 F (36.6 C) (Oral)  Resp 16  Ht 5' 10"  (1.778 m)  Wt 64.4 kg (141 lb 15.6 oz)  BMI 20.37 kg/m2  SpO2 100% Pain Assessment: Faces   Pain  Score: 0-No pain   SpO2: SpO2: 100 % O2 Device:SpO2: 100 % O2 Flow Rate: .   IO: Intake/output summary:  Intake/Output Summary (Last 24 hours) at 12/07/15 1540 Last data filed at 12/07/15 0600  Gross per 24 hour  Intake    485 ml  Output      0 ml  Net    485 ml    LBM: Last BM Date:  (PTA) Baseline Weight: Weight: 64.4 kg (141 lb 15.6 oz) Most recent weight: Weight: 64.4 kg (141 lb 15.6 oz)     Palliative Assessment/Data:     Time In: 1430 Time Out: 1540 Time Total: 8mn Greater than 50%  of this time was spent counseling and coordinating care related to the above assessment and plan.  Signed by: PPershing Proud NP   Please contact Palliative Medicine Team phone at [phone] questions and concerns.  For individual provider: See AShea Evans

## 2015-12-07 NOTE — Consult Note (Signed)
WOC wound consult note Reason for Consult: Sacral Pressure Injury Wound type: MASD (moisture associated skin damage) Skin is intact but has a difference in pigmentation, most likely related to previous skin injury.  Patient is incontinent of bowel and bladder.  Skin does have some superficial skin peeling at the perimeter but consistent with MASD.   Wound bed: no open wounds, intact skin with skin pigment changes Drainage (amount, consistency, odor) none Periwound: intact  Dressing procedure/placement/frequency: Barrier cream only after each episode of incontinence and PRN.   Discussed POC with patient and bedside nurse.  Re consult if needed, will not follow at this time. Thanks  Ileen Kahre M.D.[employer]. [employer] MSN, RN,CNS, CWOCN [phone])

## 2015-12-07 NOTE — Care Management Note (Signed)
Case Management Note  Patient Details  Name: Minerva Festeraul A Creely MRN: [id number] Date of Birth: 03/09/1924  Subjective/Objective:                 Spoke with patient's wife at the bedside. She states plan is for patient to return to SNF at DC.   Action/Plan:  Will DC to SNF as facilitated by CSW when medically stable.   Expected Discharge Date:                  Expected Discharge Plan:  [facility]  In-House Referral:  Clinical Social Work  Discharge planning Services  CM Consult  Post Acute Care Choice:  NA Choice offered to:     DME Arranged:    DME Agency:     HH Arranged:    HH Agency:     Status of Service:  Completed, signed off  If discussed at [employer] Meetings, dates discussed:    Additional Comments:  Lawerance SabalDebbie Chevis Weisensel, RN 12/07/2015, 2:54 PM

## 2015-12-07 NOTE — Progress Notes (Signed)
Admitted pt.from ED,alert non verbal ,flat affect,no acute distress noted.V/S taken & recorded.IV  Noted to be swelling.stopped IVF .& notified IV therapist.Admission arm band ID applied & verified by  Victorino DikeJennifer.NT.Fall assessment completed I ;Call light w/in reach.Skin dry & with stage 2  Pressure ulcer on both buttocks .Moisture barrier cream applied.Will continue to monitor pt.

## 2015-12-07 NOTE — Progress Notes (Signed)
Location:      Place of Service:  SNF (31)   CODE STATUS: full code  No Known Allergies  Chief Complaint  Patient presents with  . Acute Visit    wound management     HPI:  I have been asked to review his left lateral heel. He has a deep tissue injury present. There are no signs of infection present. There are indications of inflammation present. He is slowly losing weight; he is less engaging with his surroundings. He is unable to participate in the hpi or ros.   Past Medical History  Diagnosis Date  . Hyperlipidemia   . Depression   . Seizures ([facility])   . Hypokalemia   . Dementia   . Hemiplegia affecting dominant side, late effect of cerebrovascular disease   . Constipation   . Edema   . Diabetes mellitus without complication ([facility])   . Stroke ([facility])   . Hypertension   . CVA (cerebral vascular accident) ([facility]) 08/28/2012    No past surgical history on file.  Social History   Social History  . Marital Status: Married    Spouse Name: N/A  . Number of Children: N/A  . Years of Education: N/A   Occupational History  . Not on file.   Social History Main Topics  . Smoking status: Never Smoker   . Smokeless tobacco: Never Used  . Alcohol Use: No  . Drug Use: No  . Sexual Activity: No   Other Topics Concern  . Not on file   Social History Narrative   Family History  Problem Relation Age of Onset  . Hypertension Other       VITAL SIGNS BP 119/76 mmHg  Pulse 70  Ht  (1.778 m)  Wt 170 lb (77.111 kg)  BMI 24.39 kg/m2  SpO2 98%  Patient's Medications  New Prescriptions   No medications on file  Previous Medications   ACETAMINOPHEN (TYLENOL) 325 MG TABLET    Take 650 mg by mouth every 4 (four) hours as needed for mild pain or fever.   BACLOFEN (LIORESAL) 10 MG TABLET    Take 10 mg by mouth 2 (two) times daily.    BRINZOLAMIDE-BRIMONIDINE 1-0.2 % SUSP    1 gtt in both eyes BID   CARBAMAZEPINE (CARBATROL) 300 MG 12 HR CAPSULE    Take 1 capsule  (300 mg total) by mouth 2 (two) times daily.   CARBAMAZEPINE (TEGRETOL) 200 MG TABLET    Take 200 mg by mouth See admin instructions. Takes  ER in am,  reg. Release in afternoon, and takes  ER in evening   CITALOPRAM (CELEXA) 20 MG TABLET    Take 20 mg by mouth daily.   CLOPIDOGREL (PLAVIX) 75 MG TABLET    Take 75 mg by mouth daily.   FOOD THICKENER (THICK IT) POWD    Take 1 Container by mouth as needed (nectar thick). Nectar thick   GUAIFENESIN (ROBITUSSIN) 100 MG/5ML SYRUP    Take 10 mLs by mouth every 6 (six) hours as needed for cough.   INSULIN GLARGINE (LANTUS SOLOSTAR) 100 UNIT/ML SOLOSTAR PEN    Inject 5 Units into the skin every morning.    LACTOSE FREE NUTRITION (BOOST) LIQD    Take 237 mLs by mouth 3 (three) times daily between meals.   LATANOPROST (XALATAN) 0.005 % OPHTHALMIC SOLUTION    Place 1 drop into both eyes at bedtime.   MENTHOL (CEPACOL SORE THROAT) 5.4 MG LOZG  Use as directed 1 lozenge in the mouth or throat every 8 (eight) hours as needed.   MULTIPLE VITAMIN (MULTIVITAMIN) TABLET    Take 1 tablet by mouth daily.   OXYCODONE (OXY IR/ROXICODONE) 5 MG IMMEDIATE RELEASE TABLET    Take 5 mg by mouth every 8 (eight) hours as needed for severe pain. Reported on 12/03/2015   POLYETHYL GLYCOL-PROPYL GLYCOL (SYSTANE) 0.4-0.3 % SOLN    Apply 1 drop to eye 4 (four) times daily.   PROTEIN POWD    Take 2 scoop by mouth 3 (three) times daily with meals.   SENNOSIDES-DOCUSATE SODIUM (SENOKOT-S) 8.6-50 MG TABLET    Take 2 tablets by mouth 2 (two) times daily.   ZINC SULFATE 220 (50 ZN) MG CAPSULE    Take 220 mg by mouth every morning.   Modified Medications   No medications on file  Discontinued Medications   No medications on file     SIGNIFICANT DIAGNOSTIC EXAMS   07-24-14: chest x-ray; right lower lobe atelectasis and mild cardiomegaly; no infiltrate no congestion    LABS REVIEWED:    12-02-14: wbc 4.2 hgb 10.8; hct 32.3; mcv 96.1; plt 154; chol 181; ldl 119; trig  86; hdl 43; hgb a1c 6.2   05-17-15: wbc 5.6; hgb 10.3; hct 29.8; mcv 96.8; plt 184; glucose 104; bun 12; creat 0.78; k+ 4.3; na++141; liver normal albumin 3.0; cho 142; ldl 89; trig 83; hdl 36; tsh 1.036; hgb a1c 6.3; tegretol 7.5  08-11-15: hgb a1c  6.0; tegretol 7.6  10-27-15: wbc 6.6; hgb 10.3; hct 30.3; mcv 97.1; plt 271; glucose 83; bun 20; creat 0.99; k+ 4.4; na++142; ;liver normal albumin 3.4  11-18-15: pre-albumin 22    Review of Systems Unable to perform ROS: Dementia     Physical Exam Constitutional:  No distress.  frail  Eyes: Conjunctivae are normal.  Neck: Neck supple. No JVD present. No thyromegaly present.  Cardiovascular: Normal rate, regular rhythm and intact distal pulses.   Respiratory: Effort normal and breath sounds normal. No respiratory distress. He has no wheezes.  GI: Soft. Bowel sounds are normal. He exhibits no distension. There is no tenderness.  Musculoskeletal: He exhibits no edema.  Has right side hemiparesis does have upper extremity contracture  Does not move voluntarily   Lymphadenopathy:    He has no cervical adenopathy.  Neurological:not engaging Skin: Skin is warm and dry. He is not diaphoretic.  left lateral heel: DTI: 3 x 2 cm treat with skin prep    ASSESSMENT/ PLAN:  1. Left lateral heel DTI: will continue treatment with skin prep and will monitor    Synthia Innocenteborah Rayvon Dakin NP [facility]  Contact [phone] Monday through Friday 8am- 5pm  After hours call [phone]

## 2015-12-07 NOTE — Clinical Social Work Note (Signed)
Clinical Social Work Assessment  Patient Details  Name: Danny Gilbert A Garfield MRN: [id number] Date of Birth: 05/10/1924  Date of referral:  12/07/15               Reason for consult:  Facility Placement                Permission sought to share information with:  Facility Medical sales representativeContact Representative, Family Supports Permission granted to share information::  No (Patient is disoriented; Completed assessment with spouse)  Name::     [name]  Agency::  [facility]  Relationship::  Spouse  Contact Information:     Housing/Transportation Living arrangements for the past 2 months:  Skilled Building surveyorursing Facility Source of Information:  Spouse Patient Interpreter Needed:  None Criminal Activity/Legal Involvement Pertinent to Current Situation/Hospitalization:  No - Comment as needed Significant Relationships:  Adult Children, Spouse Lives with:  Facility Resident Do you feel safe going back to the place where you live?  Yes Need for family participation in patient care:  Yes (Comment)  Care giving concerns:  CSW received referral for discharge planning. Patient is disoriented. CSW spoke with patient's wife. She stated [facility] there at discharge. CSW to continue to follow and assist with discharge planning needs.   Social Worker assessment / plan:  CSW spoke with patient's wife concerning patient return to SNF.  Employment status:  Retired Health and safety inspectornsurance information:  [place] Recommendations:  Not assessed at this time Information / Referral to community resources:  Skilled Nursing Facility  Patient/Family's Response to care:  Patient's wife reports agreement with plan to [facility] once he is medically stable.  Patient/Family's Understanding of and Emotional Response to Diagnosis, Current Treatment, and Prognosis: No questions/concerns about plan or treatment.    Emotional Assessment Appearance:  Appears stated  age Attitude/Demeanor/Rapport:  Unable to Assess Affect (typically observed):  Unable to Assess Orientation:   (Disoriented) Alcohol / Substance use:  Not Applicable Psych involvement (Current and /or in the community):  No (Comment)  Discharge Needs  Concerns to be addressed:  Care Coordination Readmission within the last 30 days:  No Current discharge risk:  None Barriers to Discharge:  Continued Medical Work up   [employer] S Sherlynn [place], LCSWA 12/07/2015, 11:06 AM

## 2015-12-08 DIAGNOSIS — Z515 Encounter for palliative care: Secondary | ICD-10-CM

## 2015-12-08 DIAGNOSIS — R1310 Dysphagia, unspecified: Secondary | ICD-10-CM

## 2015-12-08 LAB — COMPREHENSIVE METABOLIC PANEL
ALBUMIN: 2.9 g/dL — AB (ref 3.5–5.0)
ALK PHOS: 90 U/L (ref 38–126)
ALT: 20 U/L (ref 17–63)
AST: 20 U/L (ref 15–41)
Anion gap: 7 (ref 5–15)
BILIRUBIN TOTAL: 0.6 mg/dL (ref 0.3–1.2)
BUN: 11 mg/dL (ref 6–20)
CALCIUM: 8.7 mg/dL — AB (ref 8.9–10.3)
CO2: 25 mmol/L (ref 22–32)
CREATININE: 0.74 mg/dL (ref 0.61–1.24)
Chloride: 108 mmol/L (ref 101–111)
GFR calc Af Amer: >60 mL/min (ref >60)
GFR calc non Af Amer: >60 mL/min (ref >60)
GLUCOSE: 128 mg/dL — AB (ref 65–99)
Potassium: 4 mmol/L (ref 3.5–5.1)
SODIUM: 140 mmol/L (ref 135–145)
TOTAL PROTEIN: 6.5 g/dL (ref 6.5–8.1)

## 2015-12-08 LAB — CBC
HCT: 32.7 % — ABNORMAL LOW (ref 39.0–52.0)
HEMATOCRIT: 25.5 % — AB (ref 39.0–52.0)
Hemoglobin: 8.4 g/dL — ABNORMAL LOW (ref 13.0–17.0)
Hemoglobin: 10.7 g/dL — ABNORMAL LOW (ref 13.0–17.0)
MCH: 33.6 pg (ref 26.0–34.0)
MCH: 32.4 pg (ref 26.0–34.0)
MCHC: 32.9 g/dL (ref 30.0–36.0)
MCHC: 32.7 g/dL (ref 30.0–36.0)
MCV: 102 fL — AB (ref 78.0–100.0)
MCV: 99.1 fL (ref 78.0–100.0)
PLATELETS: 170 10*3/uL (ref 150–400)
Platelets: 196 10*3/uL (ref 150–400)
RBC: 2.5 MIL/uL — ABNORMAL LOW (ref 4.22–5.81)
RBC: 3.3 MIL/uL — ABNORMAL LOW (ref 4.22–5.81)
RDW: 12.9 % (ref 11.5–15.5)
RDW: 12.8 % (ref 11.5–15.5)
WBC: 4.5 10*3/uL (ref 4.0–10.5)
WBC: 5.1 10*3/uL (ref 4.0–10.5)

## 2015-12-08 LAB — GLUCOSE, CAPILLARY
GLUCOSE-CAPILLARY: 136 mg/dL — AB (ref 65–99)
Glucose-Capillary: 110 mg/dL — ABNORMAL HIGH (ref 65–99)
Glucose-Capillary: 112 mg/dL — ABNORMAL HIGH (ref 65–99)
Glucose-Capillary: 121 mg/dL — ABNORMAL HIGH (ref 65–99)
Glucose-Capillary: 122 mg/dL — ABNORMAL HIGH (ref 65–99)
Glucose-Capillary: 128 mg/dL — ABNORMAL HIGH (ref 65–99)

## 2015-12-08 MED ORDER — STARCH (THICKENING) PO POWD: ORAL | Status: DC | PRN

## 2015-12-08 MED ORDER — RESOURCE THICKENUP CLEAR PO POWD
ORAL | Status: DC | PRN
  Filled 2015-12-08: qty 125

## 2015-12-08 MED ORDER — GLYCOPYRROLATE 0.2 MG/ML IJ SOLN
0.2000 mg | INTRAMUSCULAR | Status: DC
  Administered 2015-12-08 – 2015-12-09 (×5): 0.2 mg via INTRAVENOUS
  Filled 2015-12-08 (×8): qty 1

## 2015-12-08 NOTE — Progress Notes (Addendum)
Late Entry, assessment complete on 7/11  12/07/15 1000  SLP Visit Information  SLP Received On 12/07/15  General Information  HPI Danny Gilbert is a 82 y.o. male with stroke with right-sided hemiplegia nonverbal with sacral decubitus ulcers, seizure disorder, diabetes mellitus was brought in from the nursing home after patient was found to have poor oral intake over the last 3-4 days. Patient as per patient's wife did not have any nausea vomiting or diarrhea and has not had any fever or chills. In the ER abdomen appears benign. UA shows features consistent with UTI. Pt had an MBS in 2015 for a similar admission. Recommended dys 2/thin liquids with full supervision for aspiration precatuions. Pt had no aspiration, but increased risk due to residuals.   Type of Study Bedside Swallow Evaluation  Previous Swallow Assessment see HPI  Diet Prior to this Study NPO  Temperature Spikes Noted No  Respiratory Status Room air  History of Recent Intubation No  Behavior/Cognition Doesn't follow directions (does not follow commands, but turns head to sound)  Oral Cavity Assessment Excessive secretions  Oral Care Completed by SLP Yes  Oral Cavity - Dentition Dentures, top;Edentulous  Self-Feeding Abilities Total assist  Patient Positioning Upright in bed  Baseline Vocal Quality Not observed  Volitional Cough Cognitively unable to elicit  Volitional Swallow Unable to elicit  Pain Assessment  Pain Assessment Faces  Faces Pain Scale 0  Oral Assessment (Complete on admission/transfer/change in patient condition)  Does patient have any of the following "high risk" factors? Nutritional status - fluids only or NPO for >24 hours  Patient is HIGH RISK: non-ventilated Order set for Oral care protocol initiated - "High Risk" option selected  (see row information)  Patient is AT RISK Order set for Oral Care Protocol initiated -  "At risk" option selected (see row information)  Oral Motor/Sensory Function  Overall  Oral Motor/Sensory Function Other (comment) (unable to follow commands, seems strong ? R labial weakness)  Ice Chips  Ice chips Impaired  Presentation Spoon  Oral Phase Impairments Poor awareness of bolus  Oral Phase Functional Implications Oral holding  Thin Liquid  Thin Liquid Impaired  Presentation Spoon;Straw  Oral Phase Impairments Poor awareness of bolus  Oral Phase Functional Implications Oral holding  Nectar Thick Liquid  Nectar Thick Liquid NT  Honey Thick Liquid  Honey Thick Liquid NT  Puree  Puree Impaired  Presentation Spoon  Oral Phase Impairments Poor awareness of bolus  Oral Phase Functional Implications Oral holding  Solid  Solid NT  SLP Assessment  Clinical Impression Statement (ACUTE ONLY) Pt demonstrates severe cognitive impairment impacting ability to consume PO. Pt is awake and turn head to voice, visually tracks visitors, but does not follow any commands. Pt found to be holding mouth full of thick white secretions which SLP cleared with oral care. Pt was unable to orally maniuplate any PO trials appropriately; tightly held any boluses despite max to total assist for oral transit. Boluses removed with suction. Recommend pt remain NPO until response to PO improves. Will follow for readiness.   Impact on safety and function Severe aspiration risk  Other Related Risk Factors Cognitive impairment  Swallow Evaluation Recommendations  SLP Diet Recommendations NPO  Medication Administration Via alternative means  Treatment Plan  Oral Care Recommendations Oral care QID  Treatment Recommendations Therapy as outlined in treatment plan below  Follow up Recommendations Skilled Nursing facility  Speech Therapy Frequency (ACUTE ONLY) min 2x/week  Treatment Duration 2 weeks  Interventions Compensatory  techniques;Patient/family education;Trials of upgraded texture/liquids;Diet toleration management by SLP;Feeding strategy training  Prognosis  Prognosis for Safe Diet  Advancement Fair  Barriers to Reach Goals Cognitive deficits  Individuals Consulted  Consulted and Agree with Results and Recommendations Patient unable/family or caregiver not available  Progression Toward Goals  Progression toward goals Progressing toward goals  SLP Time Calculation  SLP Start Time (ACUTE ONLY) 1020  SLP Stop Time (ACUTE ONLY) 1040  SLP Time Calculation (min) (ACUTE ONLY) 20 min  SLP G-Codes **NOT FOR INPATIENT CLASS**  Functional Assessment Tool Used clinical judgement  Functional Limitations Swallowing  Swallow Current Status (Z6109(G8996) CM  Swallow Goal Status (U0454(G8997) CJ  SLP Evaluations  $ SLP Speech Visit 1 Procedure  SLP Evaluations  $BSS Swallow 1 Procedure  $Swallowing Treatment 1 Procedure

## 2015-12-08 NOTE — Progress Notes (Signed)
[facility] PROGRESS NOTE  Danny Gilbert:[id number] DOB: [birth year] DOA: 12/06/2015   PCP: Kirt Boys, DO     Assessment/Plan: Principal Problem:   Failure to thrive in adult Active Problems:   Seizures (HCC)   History of stroke with residual deficit   DM (diabetes mellitus) type II controlled, neurological manifestation (HCC)   UTI (lower urinary tract infection)   Pressure ulcer   Palliative care encounter   82 y.o. male with stroke with right-sided hemiplegia nonverbal with sacral decubitus ulcers, seizure disorder, diabetes mellitus was brought in from the nursing home after patient was found to have poor oral intake over the last 3-4 days. Patient as per patient's wife did not have any nausea vomiting or diarrhea and has not had any fever or chills. In the ER abdomen appears benign. UA shows features consistent with UTI. Patient has been admitted for further observation and management.   ED Course: Patient was given fluids and started on ceftriaxone for UTI.  Assessment and plan 1. Failure to thrive with poor oral intake - suspect most likely secondary UTI for which patient has been placed on ceftriaxone. Urine culture positive for 20,000 colonies of gram-negative rods   2. UTI -continue on ceftriaxone. Follow urine cultures.   3. Diabetes mellitus type 2 - patient's blood sugars in the low normal. Holding off Lantus for now and patient has been placed on D5 half-normal saline. Closely follow CBGs.   4. Seizure disorder - since patient is nothing by mouth for now until swallow evaluation I have discussed with on-call neurologist Dr. Roseanne Reno who advised okay to keep patient on IV Keppra until patient can take Tegretol orally.   Tegretol levels 3.1 . Speech therapy evaluation recommends nothing by mouth. Patient high risk for aspiration. Determine hospice eligibility comfort feeding inability to take seizure medications   5. History of CVA with right-sided  hemiplegia - will be on per rectal aspirin and the patient can take oral Plavix.   6. Sacral decubitus ulcer - wound team consult. See note   7. Depression: is stable does receive benefit from celexa 20 mg daily will monitor his status   8. Mixed Alzheimer's and vascular dementia: he is having a slow progressive decline. He is not verbal nearly all of the time. is presently not taking medications;    palliative care consultation for goals of care, MOST forms     DVT prophylaxsis  Lovenox  Code Status:  DO NOT RESUSCITATE     Family Communication:   Palliative care has been in touch with the family, also talked to wife by the bedside   Disposition Plan:  Determine hospice eligibility comfort feeding inability to take seizure medications     Consultants:  Palliative care  Procedures:  None  Antibiotics: Anti-infectives    Start     Dose/Rate Route Frequency Ordered Stop   12/07/15 2000  cefTRIAXone (ROCEPHIN) 1 g in dextrose 5 % 50 mL IVPB     1 g 100 mL/hr over 30 Minutes Intravenous Every 24 hours 12/06/15 2241     12/06/15 1945  cefTRIAXone (ROCEPHIN) 1 g in dextrose 5 % 50 mL IVPB     1 g 100 mL/hr over 30 Minutes Intravenous  Once 12/06/15 1938 12/06/15 2112         HPI/Subjective: Patient not conversive, doesn't answer questions but more awake then yesterday  Objective: Filed Vitals:   12/07/15 1437 12/07/15 2245 12/08/15 0255 12/08/15 0542  BP: 146/59 157/66  159/71  Pulse: 79 88  66  Temp: 97.8 F (36.6 C) 98.3 F (36.8 C)  97.8 F (36.6 C)  TempSrc: Oral Axillary  Axillary  Resp: 16 22  20   Height:      Weight:   63.1 kg (139 lb 1.8 oz)   SpO2: 100% 98%  100%   No intake or output data in the 24 hours ending 12/08/15 1228  Exam:  Examination:  General exam: confused   Respiratory system: Clear to auscultation. Respiratory effort normal. Cardiovascular system: S1 & S2 heard, RRR. No JVD, murmurs, rubs, gallops or clicks. No pedal  edema. Gastrointestinal system: Abdomen is nondistended, soft and nontender. No organomegaly or masses felt. Normal bowel sounds heard. Central nervous system: unable to assess  Extremities: Symmetric 5 x 5 power. Skin: No rashes, lesions or ulcers Psychiatry: Judgement and insight impaired .     Data Reviewed: I have personally reviewed following labs and imaging studies  Micro Results Recent Results (from the past 240 hour(s))  Urine culture     Status: Abnormal (Preliminary result)   Collection Time: 12/06/15  6:24 PM  Result Value Ref Range Status   Specimen Description URINE, RANDOM  Final   Special Requests NONE  Final   Culture 20,000 COLONIES/mL GRAM NEGATIVE RODS (A)  Final   Report Status PENDING  Incomplete  MRSA PCR Screening     Status: None   Collection Time: 12/06/15  9:44 PM  Result Value Ref Range Status   MRSA by PCR NEGATIVE NEGATIVE Final    Comment:        The GeneXpert MRSA Assay (FDA approved for NASAL specimens only), is one component of a comprehensive MRSA colonization surveillance program. It is not intended to diagnose MRSA infection nor to guide or monitor treatment for MRSA infections.     Radiology Reports Dg Chest Portable 1 View  12/06/2015  CLINICAL DATA:  Weakness EXAM: PORTABLE CHEST 1 VIEW COMPARISON:  07/18/2013 FINDINGS: Cardiac shadow is within normal limits. The lungs are well aerated bilaterally. No focal infiltrate or sizable effusion is seen. Some stable changes in the left base consistent with scarring are noted. No bony abnormality is seen. IMPRESSION: No active disease. Electronically Signed   By: Alcide CleverMark  Lukens M.D.   On: 12/06/2015 17:19     CBC  Recent Labs Lab 12/06/15 1704 12/06/15 2340 12/07/15 0731 12/08/15 0618 12/08/15 0936  WBC 5.2 5.9 6.1 4.5 5.1  HGB 13.1 11.9* 11.3* 8.4* 10.7*  HCT 40.2 36.6* 34.5* 25.5* 32.7*  PLT 207 208 200 170 196  MCV 101.8* 100.5* 100.0 102.0* 99.1  MCH 33.2 32.7 32.8 33.6 32.4   MCHC 32.6 32.5 32.8 32.9 32.7  RDW 13.2 13.0 13.0 12.9 12.8  LYMPHSABS 2.0  --  2.2  --   --   MONOABS 0.7  --  0.9  --   --   EOSABS 0.2  --  0.3  --   --   BASOSABS 0.0  --  0.0  --   --     Chemistries   Recent Labs Lab 12/06/15 1704 12/06/15 2340 12/07/15 0731 12/08/15 0936  NA 142  --  142 140  K 5.4*  --  4.4 4.0  CL 105  --  108 108  CO2 23  --  22 25  GLUCOSE 68  --  112* 128*  BUN 21*  --  20 11  CREATININE 1.01 1.10 0.98 0.74  CALCIUM 9.4  --  8.9 8.7*  AST 32  --  21 20  ALT 26  --  22 20  ALKPHOS 111  --  103 90  BILITOT 1.4*  --  1.2 0.6   ------------------------------------------------------------------------------------------------------------------ estimated creatinine clearance is 55.9 mL/min (by C-G formula based on Cr of 0.74). ------------------------------------------------------------------------------------------------------------------ No results for input(s): HGBA1C in the last 72 hours. ------------------------------------------------------------------------------------------------------------------ No results for input(s): CHOL, HDL, LDLCALC, TRIG, CHOLHDL, LDLDIRECT in the last 72 hours. ------------------------------------------------------------------------------------------------------------------ No results for input(s): TSH, T4TOTAL, T3FREE, THYROIDAB in the last 72 hours.  Invalid input(s): FREET3 ------------------------------------------------------------------------------------------------------------------ No results for input(s): VITAMINB12, FOLATE, FERRITIN, TIBC, IRON, RETICCTPCT in the last 72 hours.  Coagulation profile No results for input(s): INR, PROTIME in the last 168 hours.  No results for input(s): DDIMER in the last 72 hours.  Cardiac Enzymes No results for input(s): CKMB, TROPONINI, MYOGLOBIN in the last 168 hours.  Invalid input(s):  CK ------------------------------------------------------------------------------------------------------------------ Invalid input(s): POCBNP   CBG:  Recent Labs Lab 12/07/15 1654 12/07/15 1947 12/08/15 0035 12/08/15 0430 12/08/15 0744  GLUCAP 126* 119* 110* 128* 121*       Studies: Dg Chest Portable 1 View  12/06/2015  CLINICAL DATA:  Weakness EXAM: PORTABLE CHEST 1 VIEW COMPARISON:  07/18/2013 FINDINGS: Cardiac shadow is within normal limits. The lungs are well aerated bilaterally. No focal infiltrate or sizable effusion is seen. Some stable changes in the left base consistent with scarring are noted. No bony abnormality is seen. IMPRESSION: No active disease. Electronically Signed   By: Alcide Clever M.D.   On: 12/06/2015 17:19      Lab Results  Component Value Date   HGBA1C 6.0 08/11/2015   HGBA1C 6.3* 07/28/2014   HGBA1C 10.7* 07/14/2013   Lab Results  Component Value Date   LDLCALC 110* 02/25/2014   CREATININE 0.74 12/08/2015       Scheduled Meds: . antiseptic oral rinse  7 mL Mouth Rinse BID  . cefTRIAXone (ROCEPHIN)  IV  1 g Intravenous Q24H  . enoxaparin (LOVENOX) injection  40 mg Subcutaneous Daily  . insulin aspart  0-9 Units Subcutaneous Q4H  . latanoprost  1 drop Both Eyes QHS  . levETIRAcetam  500 mg Intravenous BID   Continuous Infusions: . dextrose 5 % and 0.45% NaCl 75 mL/hr at 12/07/15 0056     LOS: 1 day    Time spent: >30 MINS    [facility]  [facility] Pager [phone]. If 7PM-7AM, please contact night-coverage at [email or web address], password [facility] 12/08/2015, 12:28 PM  LOS: 1 day

## 2015-12-08 NOTE — Progress Notes (Signed)
Speech Language Pathology Treatment: Dysphagia  Patient Details Name: Danny Gilbert MRN: [id number] DOB: 12/31/1923 Today's Date: 12/08/2015 Time: [id number] SLP Time Calculation (min) (ACUTE ONLY): 17 min  Assessment / Plan / Recommendation Clinical Impression  Pt with little change today. Denture is back in mouth with thick, pooled secretions above it, which contributes to risk of infection from aspiration of oral bacteria. Denture again removed and oral care completed. Pt responded to seeing a cup of water by puckering his lips for a sip. Pt did initiate swallow on this first sip, but orally held all further attempts despite use of a dry spoon to trigger swallow, slight posterior head tilt and verbal tactile cues. Pt observed to clear throat while orally holding, indicative of sensed penetration to airway. Pt is not capable of nutritional support with PO, but could be offered bites of puree and sips of liquid for pleasure with oral suction available to remove bolus.  Discussed with RN, no family present. Will f/u for progress.    HPI HPI: Danny Gilbert is a 82 y.o. male with stroke with right-sided hemiplegia nonverbal with sacral decubitus ulcers, seizure disorder, diabetes mellitus was brought in from the nursing home after patient was found to have poor oral intake over the last 3-4 days. Patient as per patient's wife did not have any nausea vomiting or diarrhea and has not had any fever or chills. In the ER abdomen appears benign. UA shows features consistent with UTI. Pt had an MBS in 2015 for a similar admission. Recommended dys 2/thin liquids with full supervision for aspiration precatuions. Pt had no aspiration, but increased risk due to residuals.       SLP Plan  Continue with current plan of care     Recommendations  Diet recommendations: NPO (or bites of puree/sips of thin for comfort) Liquids provided via: Cup             Oral Care Recommendations: Oral care before and after  PO Plan: Continue with current plan of care     GO               [facility] Center LLCBonnie Bartt Gonzaga, MA CCC-SLP [phone]  [name], Mashonda Broski Caroline 12/08/2015, 11:37 AM

## 2015-12-08 NOTE — Progress Notes (Addendum)
Daily Progress Note   Patient Name: Danny Gilbert       Date: 12/08/2015 DOB: [birth year]  Age: 82 y.o. MRN#: [id number] Attending Physician: Reyne Dumas, MD Primary Care Physician: Gildardo Cranker, DO Admit Date: 12/06/2015  Reason for Consultation/Follow-up: Establishing goals of care  Subjective: I met again today with Danny Gilbert and his wife. Noted SLP evaluation that he is pocketing and holding all consistencies of intake and they are recommending NPO vs comfort feeds right now. I have had a long discussion with Danny Gilbert and explain that this is typical of the last stage of dementia and with limited to no intake his prognosis is very poor and he would be considered < 2 weeks with a focus on comfort. She confirms that they would not desire feeding tube. Her main goal for him is to focus on comfort and QOL. He also is struggling more with secretions today than when I saw him yesterday - this is also a sign of worsening that is very concerning. They have expressed a desire to transition to hospice facility, in particular, [facility]. Another option would be to have hospice support at SNF if his status changes. I also spoke with her daughter over the phone to explain poor prognosis at Danny Gilbert' request.   Late Entry: I met again at the bedside of Danny Gilbert along with wife and daughter Danny Gilbert who I spoke with via telephone earlier. Danny Gilbert is supporting her family and Danny Gilbert is more awake. We give him a trial of pudding for comfort and family is able to get him to swallow a few bites when I am at bedside but with much encouragement and time. Wife asks if this changes things. I explain that I fear that this may fluctuate and that it will be very difficult to keep him with adequate nutrition  and hydration as difficult as this is with him. I will follow up tomorrow for any improvements and we will discuss more if [facility] or SNF with hospice more appropriate.   Length of Stay: 1  Current Medications: Scheduled Meds:  . antiseptic oral rinse  7 mL Mouth Rinse BID  . cefTRIAXone (ROCEPHIN)  IV  1 g Intravenous Q24H  . enoxaparin (LOVENOX) injection  40 mg Subcutaneous Daily  . insulin aspart  0-9 Units  Subcutaneous Q4H  . latanoprost  1 drop Both Eyes QHS  . levETIRAcetam  500 mg Intravenous BID    Continuous Infusions: . dextrose 5 % and 0.45% NaCl 75 mL/hr at 12/07/15 0056    PRN Meds: acetaminophen **OR** acetaminophen, ondansetron **OR** ondansetron (ZOFRAN) IV  Physical Exam  Constitutional: He appears well-developed. He appears lethargic.  HENT:  Head: Normocephalic and atraumatic.  Cardiovascular: Normal rate.   Pulmonary/Chest: No accessory muscle usage. No tachypnea. No respiratory distress.  Congested, copious secretions. He is unable to clear and has very weak cough.   Abdominal: Soft. Normal appearance.  Neurological: He appears lethargic. He is disoriented.            Vital Signs: BP 152/62 mmHg  Pulse 71  Temp(Src) 98 F (36.7 C) (Axillary)  Resp 15  Ht _0  (1.778 m)  Wt 63.1 kg (139 lb 1.8 oz)  BMI 19.96 kg/m2  SpO2 100% SpO2: SpO2: 100 % O2 Device: O2 Device: Not Delivered O2 Flow Rate:    Intake/output summary: No intake or output data in the 24 hours ending 12/08/15 1539 LBM: Last BM Date:  (PTA) Baseline Weight: Weight: 64.4 kg (141 lb 15.6 oz) Most recent weight: Weight: 63.1 kg (139 lb 1.8 oz)       Palliative Assessment/Data:    Flowsheet Rows        Most Recent Value   Intake Tab    Referral Department  Hospitalist   Unit at Time of Referral  Med/Surg Unit   Palliative Care Primary Diagnosis  Neurology   Date Notified  12/07/15   Palliative Care Type  New Palliative care   Reason for referral  Clarify Goals of Care    Date of Admission  12/06/15   Date first seen by Palliative Care  12/07/15   # of days Palliative referral response time  0 Day(s)   # of days IP prior to Palliative referral  1   Clinical Assessment    Psychosocial & Spiritual Assessment    Palliative Care Outcomes       Patient Active Problem List   Diagnosis Date Noted  . Pressure ulcer 12/07/2015  . Deep tissue injury 12/07/2015  . Palliative care encounter   . Acute renal failure ([place]) 12/06/2015  . Dehydration 12/06/2015  . Failure to thrive in adult 12/06/2015  . UTI (lower urinary tract infection) 12/06/2015  . Loss of weight 08/11/2015  . DM (diabetes mellitus) type II controlled, neurological manifestation ([place]) 07/13/2014  . Depression due to dementia 11/17/2013  . Dysphagia, oropharyngeal phase 07/24/2013  . Mixed Alzheimer's and vascular dementia 01/10/2013  . Hemiplegia of dominant side as late effect following cerebrovascular disease ([place]) 08/28/2012  . Seizures ([place]) 08/28/2012  . Constipation 08/28/2012  . History of stroke with residual deficit 08/28/2012    Palliative Care Assessment & Plan   Patient Profile: 81 y.o. male with past medical history of stroke with right-sided hemiplegia, nonverbal, with sacral decubitus ulcers, seizure disorder, diabetes mellitus was brought in from the nursing home after patient was found to have poor oral intake over the last 3-4 days. Admitted on 12/06/2015 with UTI.   Assessment: Lying in bed. No signs of discomfort.   Recommendations/Plan:  Secretions: Robinul 0.2 mg every 4 hours.   Goals of Care and Additional Recommendations:  Limitations on Scope of Treatment: No Artificial Feeding  Code Status:    Code Status Orders        Start  Ordered   12/06/15 2239  Do not attempt resuscitation (DNR)   Continuous    Question Answer Comment  In the event of cardiac or respiratory ARREST Do not call a "code blue"   In the event of cardiac or respiratory  ARREST Do not perform Intubation, CPR, defibrillation or ACLS   In the event of cardiac or respiratory ARREST Use medication by any route, position, wound care, and other measures to relive pain and suffering. May use oxygen, suction and manual treatment of airway obstruction as needed for comfort.      12/06/15 2240    Code Status History    Date Active Date Inactive Code Status Order ID Comments User Context   07/19/2013  5:23 PM 07/21/2013  3:42 PM DNR [id number]  Marcelle Smiling, MD Inpatient   07/14/2013  1:12 PM 07/19/2013  5:23 PM Full Code [id number]  Kinnie Feil, MD Inpatient    Advance Directive Documentation        Most Recent Value   Type of Advance Directive  Out of facility DNR (pink MOST or yellow form)   Pre-existing out of facility DNR order (yellow form or pink MOST form)     "MOST" Form in Place?         Prognosis:   < 2 weeks  Discharge Planning:  [facility] vs SNF with hospice  Thank you for allowing the Palliative Medicine Team to assist in the care of this patient.   Time In/Out: 1500-1530, 1745-1800 Total Time 23mn Prolonged Time Billed  no       Greater than 50%  of this time was spent counseling and coordinating care related to the above assessment and plan.  PPershing Proud NP  Please contact Palliative Medicine Team phone at [phone] questions and concerns.

## 2015-12-09 DIAGNOSIS — R1310 Dysphagia, unspecified: Secondary | ICD-10-CM | POA: Insufficient documentation

## 2015-12-09 DIAGNOSIS — E860 Dehydration: Secondary | ICD-10-CM

## 2015-12-09 LAB — GLUCOSE, CAPILLARY
GLUCOSE-CAPILLARY: 126 mg/dL — AB (ref 65–99)
GLUCOSE-CAPILLARY: 120 mg/dL — AB (ref 65–99)
Glucose-Capillary: 81 mg/dL (ref 65–99)
Glucose-Capillary: 99 mg/dL (ref 65–99)
Glucose-Capillary: 104 mg/dL — ABNORMAL HIGH (ref 65–99)

## 2015-12-09 LAB — URINE CULTURE: Culture: 20000 — AB

## 2015-12-09 MED ORDER — CEPHALEXIN 500 MG PO CAPS: 500.0000 mg | ORAL_CAPSULE | Freq: Four times a day (QID) | ORAL | Status: AC

## 2015-12-09 MED ORDER — LEVETIRACETAM 100 MG/ML PO SOLN: 500.0000 mg | Freq: Two times a day (BID) | ORAL | Status: AC

## 2015-12-09 MED ORDER — OXYCODONE HCL 5 MG PO TABS: 5.0000 mg | ORAL_TABLET | Freq: Three times a day (TID) | ORAL | Status: DC | PRN

## 2015-12-09 NOTE — Progress Notes (Signed)
[facility] Anticipated discharge date: 7/13 Family notified: at bedside Transportation by [facility]- called at 2:30pm  CSW signing off.  Merlyn LotJenna Holoman, LCSWA Clinical Social Worker [phone]

## 2015-12-09 NOTE — Progress Notes (Signed)
Speech Language Pathology Treatment: Dysphagia  Patient Details Name: Danny Gilbert MRN: [id number] DOB: 07/02/1923 Today's Date: 12/09/2015 Time: 0920-0928 SLP Time Calculation (min) (ACUTE ONLY): 8 min  Assessment / Plan / Recommendation Clinical Impression  Pt initiated swallowing more rapidly and consistently today than in prior sessions. Pt now on diet for comfort with known risk, as is appropriate. SLP assisted NT in feeding pt with total assist tactile cues to reduce oral holding behaviors, particularly with pureed solids. No need for denture, which decreases pt sensation of foods for swallow trigger and is not needed with current diet. Recommend pt continue current diet after d/c. No further acute SLP needs at this time. Will sign off.    HPI HPI: Danny Gilbert is a 82 y.o. male with stroke with right-sided hemiplegia nonverbal with sacral decubitus ulcers, seizure disorder, diabetes mellitus was brought in from the nursing home after patient was found to have poor oral intake over the last 3-4 days. Patient as per patient's wife did not have any nausea vomiting or diarrhea and has not had any fever or chills. In the ER abdomen appears benign. UA shows features consistent with UTI. Pt had an MBS in 2015 for a similar admission. Recommended dys 2/thin liquids with full supervision for aspiration precatuions. Pt had no aspiration, but increased risk due to residuals.       SLP Plan  Discharge SLP treatment due to (comment)     Recommendations  Diet recommendations: Dysphagia 1 (puree);Nectar-thick liquid Liquids provided via: Cup;Teaspoon Supervision: Full supervision/cueing for compensatory strategies Compensations: Slow rate;Small sips/bites;Follow solids with liquid;Minimize environmental distractions Postural Changes and/or Swallow Maneuvers: Seated upright 90 degrees;Upright 30-60 min after meal             Oral Care Recommendations: Oral care before and after PO Follow up  Recommendations: Skilled Nursing facility Plan: Discharge SLP treatment due to (comment)     GO               Harlon DittyBonnie Vermon Grays, MA CCC-SLP [phone]  [name], [name] 12/09/2015, 9:45 AM

## 2015-12-09 NOTE — Discharge Summary (Addendum)
Physician Discharge Summary  Danny Gilbert MRN: [id number] DOB/AGE: [birth year] 80 y.o.  PCP: Gildardo Cranker, DO   Admit date: 12/06/2015 Discharge date: 12/09/2015  Discharge Diagnoses:     Principal Problem:   Failure to thrive in adult Active Problems:   Seizures ([place])   History of stroke with residual deficit   DM (diabetes mellitus) type II controlled, neurological manifestation (HCC)   UTI (lower urinary tract infection)   Pressure ulcer   Palliative care encounter   Dysphagia   Goals of care DO NOT RESUSCITATE Prognosis: 1. < 2 weeks  Discharge Planning: 2. [facility] vs SNF with hospice  Patient needs to be followed by palliative care services at [facility]   Speech therapy Diet recommendations: Dysphagia 1 (puree);Nectar-thick liquid Liquids provided via: Cup  Current Discharge Medication List    START taking these medications   Details  cephALEXin (KEFLEX) 500 MG capsule Take 1 capsule (500 mg total) by mouth 4 (four) times daily. Qty: 28 capsule, Refills: 0    levETIRAcetam (KEPPRA) 100 MG/ML solution Take 5 mLs (500 mg total) by mouth 2 (two) times daily. Qty: 473 mL, Refills: 12      CONTINUE these medications which have NOT CHANGED   Details  acetaminophen (TYLENOL) 325 MG tablet Take 650 mg by mouth every 4 (four) hours as needed for mild pain or fever.    baclofen (LIORESAL) 10 MG tablet Take 10 mg by mouth 2 (two) times daily.     Brinzolamide-Brimonidine 1-0.2 % SUSP 1 gtt in both eyes BID   Associated Diagnoses: Open-angle glaucoma, moderate stage    carbamazepine (CARBATROL) 300 MG 12 hr capsule Take 1 capsule (300 mg total) by mouth 2 (two) times daily.   Associated Diagnoses: Seizures ([place])    citalopram (CELEXA) 20 MG tablet Take 20 mg by mouth daily.    clopidogrel (PLAVIX) 75 MG tablet Take 75 mg by mouth daily.    food thickener (THICK IT) POWD Take 1 Container by mouth as needed (nectar thick). Nectar thick    guaifenesin  (ROBITUSSIN) 100 MG/5ML syrup Take 10 mLs by mouth every 6 (six) hours as needed for cough.    lactose free nutrition (BOOST) LIQD Take 237 mLs by mouth 3 (three) times daily between meals.    latanoprost (XALATAN) 0.005 % ophthalmic solution Place 1 drop into both eyes at bedtime.    Menthol (CEPACOL SORE THROAT) 5.4 MG LOZG Use as directed 1 lozenge in the mouth or throat every 8 (eight) hours as needed.    Multiple Vitamin (MULTIVITAMIN) tablet Take 1 tablet by mouth daily.    oxyCODONE (OXY IR/ROXICODONE) 5 MG immediate release tablet Take 5 mg by mouth every 8 (eight) hours as needed for severe pain. Reported on 12/03/2015    Polyethyl Glycol-Propyl Glycol (SYSTANE) 0.4-0.3 % SOLN Apply 1 drop to eye 4 (four) times daily.    Protein POWD Take 2 scoop by mouth 3 (three) times daily with meals.    sennosides-docusate sodium (SENOKOT-S) 8.6-50 MG tablet Take 2 tablets by mouth 2 (two) times daily. Qty: 120 tablet, Refills: prn   Associated Diagnoses: Slow transit constipation    zinc sulfate 220 (50 Zn) MG capsule Take 220 mg by mouth every morning.       STOP taking these medications     carbamazepine (TEGRETOL) 200 MG tablet      Insulin Glargine (LANTUS SOLOSTAR) 100 UNIT/ML Solostar Pen          Discharge Condition:  Prognosis poor  Discharge Instructions Get Medicines reviewed and adjusted: Please take all your medications with you for your next visit with your Primary MD  Please request your Primary MD to go over all hospital tests and procedure/radiological results at the follow up, please ask your Primary MD to get all Hospital records sent to his/her office.  If you experience worsening of your admission symptoms, develop shortness of breath, life threatening emergency, suicidal or homicidal thoughts you must seek medical attention immediately by calling 911 or calling your MD immediately if symptoms less severe.  You must read complete instructions/literature  along with all the possible adverse reactions/side effects for all the Medicines you take and that have been prescribed to you. Take any new Medicines after you have completely understood and accpet all the possible adverse reactions/side effects.   Do not drive when taking Pain medications.   Do not take more than prescribed Pain, Sleep and Anxiety Medications  Special Instructions: If you have smoked or chewed Tobacco in the last 2 yrs please stop smoking, stop any regular Alcohol and or any Recreational drug use.  Wear Seat belts while driving.  Please note  You were cared for by a hospitalist during your hospital stay. Once you are discharged, your primary care physician will handle any further medical issues. Please note that NO REFILLS for any discharge medications will be authorized once you are discharged, as it is imperative that you return to your primary care physician (or establish a relationship with a primary care physician if you do not have one) for your aftercare needs so that they can reassess your need for medications and monitor your lab values.     No Known Allergies    Disposition: 01-Home or Self Care   Consults:  Palliative care    Significant Diagnostic Studies:  Dg Chest Portable 1 View  12/06/2015  CLINICAL DATA:  Weakness EXAM: PORTABLE CHEST 1 VIEW COMPARISON:  07/18/2013 FINDINGS: Cardiac shadow is within normal limits. The lungs are well aerated bilaterally. No focal infiltrate or sizable effusion is seen. Some stable changes in the left base consistent with scarring are noted. No bony abnormality is seen. IMPRESSION: No active disease. Electronically Signed   By: Inez Catalina M.D.   On: 12/06/2015 17:19       Filed Weights   12/07/15 0503 12/08/15 0255 12/09/15 0655  Weight: 64.4 kg (141 lb 15.6 oz) 63.1 kg (139 lb 1.8 oz) 65.5 kg (144 lb 6.4 oz)     Microbiology: Recent Results (from the past 240 hour(s))  Urine culture     Status:  Abnormal   Collection Time: 12/06/15  6:24 PM  Result Value Ref Range Status   Specimen Description URINE, RANDOM  Final   Special Requests NONE  Final   Culture 20,000 COLONIES/mL PROVIDENCIA STUARTII (A)  Final   Report Status 12/09/2015 FINAL  Final   Organism ID, Bacteria PROVIDENCIA STUARTII (A)  Final      Susceptibility   Providencia stuartii - MIC*    AMPICILLIN <=2 RESISTANT Resistant     CEFAZOLIN >=64 RESISTANT Resistant     CEFTRIAXONE <=1 SENSITIVE Sensitive     CIPROFLOXACIN >=4 RESISTANT Resistant     GENTAMICIN 8 RESISTANT Resistant     IMIPENEM 1 SENSITIVE Sensitive     NITROFURANTOIN 256 RESISTANT Resistant     TRIMETH/SULFA <=20 SENSITIVE Sensitive     AMPICILLIN/SULBACTAM <=2 SENSITIVE Sensitive     PIP/TAZO <=4 SENSITIVE Sensitive     *  20,000 COLONIES/mL PROVIDENCIA STUARTII  MRSA PCR Screening     Status: None   Collection Time: 12/06/15  9:44 PM  Result Value Ref Range Status   MRSA by PCR NEGATIVE NEGATIVE Final    Comment:        The GeneXpert MRSA Assay (FDA approved for NASAL specimens only), is one component of a comprehensive MRSA colonization surveillance program. It is not intended to diagnose MRSA infection nor to guide or monitor treatment for MRSA infections.        Blood Culture    Component Value Date/Time   SDES URINE, RANDOM 12/06/2015 1824   SPECREQUEST NONE 12/06/2015 1824   CULT 20,000 COLONIES/mL PROVIDENCIA STUARTII* 12/06/2015 1824   REPTSTATUS 12/09/2015 FINAL 12/06/2015 1824      Labs: Results for orders placed or performed during the hospital encounter of 12/06/15 (from the past 48 hour(s))  Glucose, capillary     Status: Abnormal   Collection Time: 12/07/15 11:49 AM  Result Value Ref Range   Glucose-Capillary 137 (H) 65 - 99 mg/dL  Glucose, capillary     Status: Abnormal   Collection Time: 12/07/15  4:54 PM  Result Value Ref Range   Glucose-Capillary 126 (H) 65 - 99 mg/dL  Glucose, capillary     Status:  Abnormal   Collection Time: 12/07/15  7:47 PM  Result Value Ref Range   Glucose-Capillary 119 (H) 65 - 99 mg/dL   Comment 1 Notify RN    Comment 2 Document in Chart   Glucose, capillary     Status: Abnormal   Collection Time: 12/08/15 12:35 AM  Result Value Ref Range   Glucose-Capillary 110 (H) 65 - 99 mg/dL   Comment 1 Notify RN    Comment 2 Document in Chart   Glucose, capillary     Status: Abnormal   Collection Time: 12/08/15  4:30 AM  Result Value Ref Range   Glucose-Capillary 128 (H) 65 - 99 mg/dL   Comment 1 Notify RN    Comment 2 Document in Chart   CBC     Status: Abnormal   Collection Time: 12/08/15  6:18 AM  Result Value Ref Range   WBC 4.5 4.0 - 10.5 K/uL   RBC 2.50 (L) 4.22 - 5.81 MIL/uL   Hemoglobin 8.4 (L) 13.0 - 17.0 g/dL    Comment: DELTA CHECK NOTED REPEATED TO VERIFY    HCT 25.5 (L) 39.0 - 52.0 %   MCV 102.0 (H) 78.0 - 100.0 fL   MCH 33.6 26.0 - 34.0 pg   MCHC 32.9 30.0 - 36.0 g/dL   RDW 12.9 11.5 - 15.5 %   Platelets 170 150 - 400 K/uL  Glucose, capillary     Status: Abnormal   Collection Time: 12/08/15  7:44 AM  Result Value Ref Range   Glucose-Capillary 121 (H) 65 - 99 mg/dL  CBC     Status: Abnormal   Collection Time: 12/08/15  9:36 AM  Result Value Ref Range   WBC 5.1 4.0 - 10.5 K/uL   RBC 3.30 (L) 4.22 - 5.81 MIL/uL   Hemoglobin 10.7 (L) 13.0 - 17.0 g/dL    Comment: REPEATED TO VERIFY   HCT 32.7 (L) 39.0 - 52.0 %   MCV 99.1 78.0 - 100.0 fL   MCH 32.4 26.0 - 34.0 pg   MCHC 32.7 30.0 - 36.0 g/dL   RDW 12.8 11.5 - 15.5 %   Platelets 196 150 - 400 K/uL  Comprehensive metabolic panel  Status: Abnormal   Collection Time: 12/08/15  9:36 AM  Result Value Ref Range   Sodium 140 135 - 145 mmol/L   Potassium 4.0 3.5 - 5.1 mmol/L   Chloride 108 101 - 111 mmol/L   CO2 25 22 - 32 mmol/L   Glucose, Bld 128 (H) 65 - 99 mg/dL   BUN 11 6 - 20 mg/dL   Creatinine, Ser 0.74 0.61 - 1.24 mg/dL   Calcium 8.7 (L) 8.9 - 10.3 mg/dL   Total Protein 6.5 6.5  - 8.1 g/dL   Albumin 2.9 (L) 3.5 - 5.0 g/dL   AST 20 15 - 41 U/L   ALT 20 17 - 63 U/L   Alkaline Phosphatase 90 38 - 126 U/L   Total Bilirubin 0.6 0.3 - 1.2 mg/dL   GFR calc non Af Amer >60 >60 mL/min   GFR calc Af Amer >60 >60 mL/min    Comment: (NOTE) The eGFR has been calculated using the CKD EPI equation. This calculation has not been validated in all clinical situations. eGFR's persistently <60 mL/min signify possible Chronic Kidney Disease.    Anion gap 7 5 - 15  Glucose, capillary     Status: Abnormal   Collection Time: 12/08/15  1:13 PM  Result Value Ref Range   Glucose-Capillary 112 (H) 65 - 99 mg/dL  Glucose, capillary     Status: Abnormal   Collection Time: 12/08/15  4:44 PM  Result Value Ref Range   Glucose-Capillary 122 (H) 65 - 99 mg/dL  Glucose, capillary     Status: Abnormal   Collection Time: 12/08/15  8:21 PM  Result Value Ref Range   Glucose-Capillary 136 (H) 65 - 99 mg/dL  Glucose, capillary     Status: None   Collection Time: 12/09/15  1:01 AM  Result Value Ref Range   Glucose-Capillary 81 65 - 99 mg/dL  Glucose, capillary     Status: None   Collection Time: 12/09/15  4:11 AM  Result Value Ref Range   Glucose-Capillary 99 65 - 99 mg/dL  Glucose, capillary     Status: Abnormal   Collection Time: 12/09/15  8:07 AM  Result Value Ref Range   Glucose-Capillary 104 (H) 65 - 99 mg/dL     Lipid Panel     Component Value Date/Time   CHOL 176 02/25/2014 0800   TRIG 110 02/25/2014 0800   HDL 44 02/25/2014 0800   CHOLHDL 4.0 02/25/2014 0800   VLDL 22 02/25/2014 0800   LDLCALC 110* 02/25/2014 0800     Lab Results  Component Value Date   HGBA1C 6.0 08/11/2015   HGBA1C 6.3* 07/28/2014   HGBA1C 10.7* 07/14/2013      82 y.o. male with stroke with right-sided hemiplegia nonverbal with sacral decubitus ulcers, seizure disorder, diabetes mellitus was brought in from the nursing home after patient was found to have poor oral intake over the last 3-4  days. Patient as per patient's wife did not have any nausea vomiting or diarrhea and has not had any fever or chills. In the ER abdomen appears benign. UA shows features consistent with UTI. Patient has been admitted for further observation and management.   ED Course: Patient was given fluids and started on ceftriaxone for UTI.  Hospital course  1.  Failure to thrive with poor oral intake - suspect most likely secondary UTI for which patient has been placed on ceftriaxone. Urine culture positive for 20,000 colonies of gram-negative rods. Urine culture shows PROVIDENCIA STUARTII (A) , switched  to Keflex for 7 days  2.  UTI -treatment as above   3. Diabetes mellitus type 2 - patient's blood sugars in the low normal. Holding off Lantus for now and patient has been placed on D5 half-normal saline.   Discontinued insulin, may discontinue CBG as the patient is nearing end of life   4. Seizure disorder - since patient is nothing by mouth , Dr. Nicole Kindred   advised to start IV Keppra until patient can take Tegretol orally. Tegretol levels 3.1 . Speech therapy evaluation recommends nothing by mouth. Patient high risk for aspiration. Determine hospice eligibility comfort feeding inability to take seizure medications. Tegretol has been discontinued and the patient has been switched to Keppra liquid form   5. History of CVA with right-sided hemiplegia - will be on per rectal aspirin and the patient can take oral Plavix.   6. Sacral decubitus ulcer - wound team consult. See note   7. Depression: is stable does receive benefit from celexa 20 mg daily will monitor his status   8. Mixed Alzheimer's and vascular dementia: he is having a slow progressive decline. He is not verbal nearly all of the time. is presently not taking medications;  palliative care consultation for goals of care, MOST forms. Palliative care to decide if [facility] or SNF with hospice more appropriate         Discharge Exam:    Blood pressure 147/78, pulse 80, temperature 98.5 F (36.9 C), temperature source Oral, resp. rate 18, height 5' 10"  (1.778 m), weight 65.5 kg (144 lb 6.4 oz), SpO2 78 %. General exam: confused  Respiratory system: Clear to auscultation. Respiratory effort normal. Cardiovascular system: S1 & S2 heard, RRR. No JVD, murmurs, rubs, gallops or clicks. No pedal edema. Gastrointestinal system: Abdomen is nondistended, soft and nontender. No organomegaly or masses felt. Normal bowel sounds heard. Central nervous system: unable to assess  Extremities: Symmetric 5 x 5 power. Skin: No rashes, lesions or ulcers Psychiatry: Judgement and insight impaired .      Follow-up Information    Follow up with Gildardo Cranker, DO. Schedule an appointment as soon as possible for a visit in 3 days.   Specialty:  Internal Medicine   Why:  Hospital follow-up   Contact information:   [place] [address] [phone]       Signed: Reyne Dumas 12/09/2015, 9:24 AM        Time spent >45 mins

## 2015-12-09 NOTE — Progress Notes (Signed)
[facility] [place] [facility]

## 2015-12-10 ENCOUNTER — Telehealth: Payer: Self-pay | Admitting: Internal Medicine

## 2015-12-10 NOTE — Telephone Encounter (Signed)
Possible re-admission to facility  - This is a [facility] you were seeing [facility] is needed IF patient was re-admitted to facility upon discharge. Hospital discharge **12/09/15**

## 2015-12-13 ENCOUNTER — Encounter: Payer: Self-pay | Admitting: Adult Health

## 2015-12-13 ENCOUNTER — Non-Acute Institutional Stay (SKILLED_NURSING_FACILITY): Payer: Medicare Other | Admitting: Adult Health

## 2015-12-13 DIAGNOSIS — F0280 Dementia in other diseases classified elsewhere without behavioral disturbance: Secondary | ICD-10-CM | POA: Diagnosis not present

## 2015-12-13 DIAGNOSIS — R569 Unspecified convulsions: Secondary | ICD-10-CM

## 2015-12-13 DIAGNOSIS — F0150 Vascular dementia without behavioral disturbance: Secondary | ICD-10-CM | POA: Diagnosis not present

## 2015-12-13 DIAGNOSIS — R627 Adult failure to thrive: Secondary | ICD-10-CM

## 2015-12-13 DIAGNOSIS — G309 Alzheimer's disease, unspecified: Secondary | ICD-10-CM

## 2015-12-13 DIAGNOSIS — I69959 Hemiplegia and hemiparesis following unspecified cerebrovascular disease affecting unspecified side: Secondary | ICD-10-CM

## 2015-12-13 LAB — PREALBUMIN: PREALBUMIN: 22

## 2015-12-13 NOTE — Progress Notes (Signed)
Patient ID: Danny Gilbert, male   DOB: [birth year], 82 y.o.   MRN: [id number]   Location:   [facility] Room Number: 123C Place of Service:  SNF (31)   CODE STATUS: DNR  No Known Allergies  Chief Complaint  Patient presents with  . Hospitalization Follow-up    Hospital follow up    HPI:  He is a long term resident who was hospitalized for failure to thrive. He will be followed by hospice care. His life expectancy is less than 2 weeks. He is not engaging to his surroundings. He is unable to participate in the hpi or ros. There are no nursing concerns at this time.   Past Medical History  Diagnosis Date  . Hyperlipidemia   . Depression   . Seizures (HCC)   . Hypokalemia   . Dementia   . Hemiplegia affecting dominant side, late effect of cerebrovascular disease   . Constipation   . Edema   . Diabetes mellitus without complication (HCC)   . Stroke (HCC)   . Hypertension   . CVA (cerebral vascular accident) (HCC) 08/28/2012    History reviewed. No pertinent past surgical history.  Social History   Social History  . Marital Status: Married    Spouse Name: N/A  . Number of Children: N/A  . Years of Education: N/A   Occupational History  . Not on file.   Social History Main Topics  . Smoking status: Never Smoker   . Smokeless tobacco: Never Used  . Alcohol Use: No  . Drug Use: No  . Sexual Activity: No   Other Topics Concern  . Not on file   Social History Narrative   Family History  Problem Relation Age of Onset  . Hypertension Other       VITAL SIGNS BP 126/64 mmHg  Pulse 78  Temp(Src) 98.4 F (36.9 C) (Oral)  Resp 18  Ht  (1.778 m)  Wt 141 lb 2 oz (64.014 kg)  BMI 20.25 kg/m2  SpO2 98%  Patient's Medications  New Prescriptions   No medications on file  Previous Medications   ACETAMINOPHEN (TYLENOL) 325 MG TABLET    Take 650 mg by mouth every 4 (four) hours as needed for mild pain or fever.   BRINZOLAMIDE-BRIMONIDINE  1-0.2 % SUSP    1 gtt in both eyes BID   CEPHALEXIN (KEFLEX) 500 MG CAPSULE    Take 1 capsule (500 mg total) by mouth 4 (four) times daily.   CITALOPRAM (CELEXA) 20 MG TABLET    Take 20 mg by mouth daily.   FOOD THICKENER (THICK IT) POWD    Take 1 Container by mouth as needed (nectar thick). Nectar thick   GUAIFENESIN (ROBITUSSIN) 100 MG/5ML SYRUP    Take 10 mLs by mouth every 6 (six) hours as needed for cough.   LATANOPROST (XALATAN) 0.005 % OPHTHALMIC SOLUTION    Place 1 drop into both eyes at bedtime.   LEVETIRACETAM (KEPPRA) 100 MG/ML SOLUTION    Take 5 mLs (500 mg total) by mouth 2 (two) times daily.   MULTIPLE VITAMIN (MULTIVITAMIN) TABLET    Take 1 tablet by mouth daily.   OXYCODONE (OXY IR/ROXICODONE) 5 MG IMMEDIATE RELEASE TABLET    Take 1 tablet (5 mg total) by mouth every 8 (eight) hours as needed for severe pain. Reported on 12/03/2015   POLYETHYL GLYCOL-PROPYL GLYCOL (SYSTANE) 0.4-0.3 % SOLN    Apply 1 drop to eye 4 (four) times daily.  PROTEIN POWD    Take 2 scoop by mouth 3 (three) times daily with meals.   SENNOSIDES-DOCUSATE SODIUM (SENOKOT-S) 8.6-50 MG TABLET    Take 2 tablets by mouth 2 (two) times daily.  Modified Medications   No medications on file  Discontinued Medications     SIGNIFICANT DIAGNOSTIC EXAMS  07-24-14: chest x-ray; right lower lobe atelectasis and mild cardiomegaly; no infiltrate no congestion    LABS REVIEWED:     05-17-15: wbc 5.6; hgb 10.3; hct 29.8; mcv 96.8; plt 184; glucose 104; bun 12; creat 0.78; k+ 4.3; na++141; liver normal albumin 3.0; cho 142; ldl 89; trig 83; hdl 36; tsh 1.036; hgb a1c 6.3; tegretol 7.5  08-11-15: hgb a1c  6.0; tegretol 7.6  10-27-15: wbc 6.6; hgb 10.3; hct 30.3; mcv 97.1; plt 271; glucose 83; bun 20; creat 0.99; k+ 4.4; na++142; ;liver normal albumin 3.4  12-06-15: wbc 5.2; hgb 13.1; hct 40.2; mcv 101.8; plt 207; glucose 68; bun 21; creat 1.01; k+ 5.4; na++ 140; liver normal albumin 3.5: urine culture 20,000 colonies 12-08-15:  wbc 4.5 hgb 8.4; hct 25.5; mcv 102.0; plt 170; glucose 128; bun 11; creat 0.74; k+ 4.0; na++ 140; liver normal albumin 2.9    Review of Systems Unable to perform ROS: Dementia     Physical Exam Constitutional: frail   Eyes: Conjunctivae are normal.  Neck: Neck supple. No JVD present. No thyromegaly present.  Cardiovascular: Normal rate, regular rhythm and intact distal pulses.   Respiratory: Effort normal and breath sounds normal. No respiratory distress. He has no wheezes.  GI: Soft. Bowel sounds are normal. He exhibits no distension. There is no tenderness.  Musculoskeletal: He exhibits no edema.  Has right side hemiparesis does have upper extremity contracture  Does not move voluntarily   Lymphadenopathy:    He has no cervical adenopathy.  Neurological:not aware of surroundings  Skin: has slight diaphoresis present.      ASSESSMENT/ PLAN:  1. Failure to thrive  2. Mixed Alzheimer's disease and vascular dementia 3. uti 4. cva 5. seizures   Will continue to focus his care upon comfort due to his terminal status Will stop his mvi; protein powder; celexa Will continue keppra for his seizure control  Will setup a hospice consult Will continue monitor his status.    Time spent with patient  50   minutes >50% time spent counseling; reviewing medical record; tests; labs; and developing future plan of care    Synthia InnocentDeborah Derin Granquist NP [facility]  Contact [phone] Monday through Friday 8am- 5pm  After hours call [phone]

## 2015-12-14 ENCOUNTER — Non-Acute Institutional Stay (SKILLED_NURSING_FACILITY): Payer: Medicare Other | Admitting: Internal Medicine

## 2015-12-14 ENCOUNTER — Encounter: Payer: Self-pay | Admitting: Internal Medicine

## 2015-12-14 DIAGNOSIS — F0280 Dementia in other diseases classified elsewhere without behavioral disturbance: Secondary | ICD-10-CM

## 2015-12-14 DIAGNOSIS — R569 Unspecified convulsions: Secondary | ICD-10-CM

## 2015-12-14 DIAGNOSIS — F329 Major depressive disorder, single episode, unspecified: Secondary | ICD-10-CM | POA: Diagnosis not present

## 2015-12-14 DIAGNOSIS — R627 Adult failure to thrive: Secondary | ICD-10-CM | POA: Diagnosis not present

## 2015-12-14 DIAGNOSIS — E1140 Type 2 diabetes mellitus with diabetic neuropathy, unspecified: Secondary | ICD-10-CM

## 2015-12-14 DIAGNOSIS — R1312 Dysphagia, oropharyngeal phase: Secondary | ICD-10-CM

## 2015-12-14 DIAGNOSIS — Z794 Long term (current) use of insulin: Secondary | ICD-10-CM | POA: Diagnosis not present

## 2015-12-14 DIAGNOSIS — G309 Alzheimer's disease, unspecified: Secondary | ICD-10-CM | POA: Diagnosis not present

## 2015-12-14 DIAGNOSIS — F0150 Vascular dementia without behavioral disturbance: Secondary | ICD-10-CM

## 2015-12-14 DIAGNOSIS — I6930 Unspecified sequelae of cerebral infarction: Secondary | ICD-10-CM | POA: Diagnosis not present

## 2015-12-14 DIAGNOSIS — F0393 Unspecified dementia, unspecified severity, with mood disturbance: Secondary | ICD-10-CM

## 2015-12-14 NOTE — Progress Notes (Signed)
Patient ID: LUAN MABERRY, male   DOB: 1923/07/14, 82 y.o.   MRN: [id number]     HISTORY AND PHYSICAL   DATE: 12/14/15  Location:  Nursing Home Location: [place] Room Number: 086 C Place of Service: SNF [phone])   Extended Emergency Contact Information Primary Emergency Contact: Bula,[name] Address: [address], [place] [address] [place] Phone: [phone] Work Phone: [phone] Relation: Spouse Secondary Emergency Contact: Starleen Blue Address: [address]          [place], [place] [address] [place] Phone: [phone] Relation: Daughter  Advanced Directive information Does patient have an advance directive?: Yes, Type of Advance Directive: Out of facility DNR (pink MOST or yellow form), Does patient want to make changes to advanced directive?: No - Patient declined  Chief Complaint  Patient presents with  . Readmit To SNF    HPI:  82 yo male long term resident seen today for readmission into SNF following hospital stay for FTT, UTI, pressure ulcer, hx CVA with dysphagia, DM, sz d/o. He was seen by palliative care. Prognosis determined to be poor. His UTI was tx with keflex. Wife decided to place him on hospice. He was sent back to SNF.  He has no concerns today. He is nonverbal. Wife at bedside and c/a some meds that were stopped when he was readmitted into the facility. She plans to meet with hospice later today. She does not want him moved to [facility] place. His prognosis is poor and <2 weeks per d/c summary. No nursing issues. No falls. Appetite poor. Sleeping more. He is taking keflex for UTI. he gets roxanol prn. He is a poor historian due to aphasia and dementia. Hx obtained from chart and wife  Failure to thrive - gets nutritional supplements  DM - CBG 86 today. 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 None observed   Glaucoma - stable with eye gtts   mvi; protein powder; celexa stopped   Past Medical History  Diagnosis Date  . Hyperlipidemia   . Depression   . Seizures ([place])   . Hypokalemia   . Dementia   . Hemiplegia affecting dominant side, late effect of cerebrovascular disease   . Constipation   . Edema   . Diabetes mellitus without complication ([place])   . Stroke ([place])   . Hypertension   . CVA (cerebral vascular accident) ([place]) 08/28/2012    History reviewed. No pertinent past surgical history.  Patient Care Team: Gildardo Cranker, DO as PCP - General (Internal Medicine) Gerlene Fee, NP as Nurse Practitioner (Nurse Practitioner) Tarri Abernethy ([place])  Social History   Social History  . Marital Status: Married    Spouse Name: N/A  . Number of Children: N/A  . Years of Education: N/A   Occupational History  . Not on file.   Social History Main Topics  . Smoking status: Never Smoker   . Smokeless tobacco: Never Used  . Alcohol Use: No  . Drug Use: No  . Sexual Activity: No   Other Topics Concern  . Not on file   Social History Narrative     reports that he has never smoked. He has never used smokeless tobacco. He reports that he does not drink alcohol or  use illicit drugs.  Family History  Problem Relation Age of Onset  . Hypertension Other    No family status information on file.    Immunization History  Administered Date(s) Administered  . Influenza-Unspecified 03/05/2014, 02/22/2015  . Pneumococcal-Unspecified 03/29/2010, 03/17/2014    No Known Allergies  Medications: Patient's Medications  New Prescriptions   No medications on file  Previous Medications   ACETAMINOPHEN (TYLENOL) 325 MG TABLET    Take 650 mg by mouth every 4 (four) hours as needed for mild pain or fever.   BRINZOLAMIDE-BRIMONIDINE 1-0.2 % SUSP    1 gtt in both eyes BID   CEPHALEXIN (KEFLEX)  500 MG CAPSULE    Take 1 capsule (500 mg total) by mouth 4 (four) times daily.   FOOD THICKENER (THICK IT) POWD    Take 1 Container by mouth as needed (nectar thick). Nectar thick   GUAIFENESIN (ROBITUSSIN) 100 MG/5ML SYRUP    Take 10 mLs by mouth every 6 (six) hours as needed for cough.   LATANOPROST (XALATAN) 0.005 % OPHTHALMIC SOLUTION    Place 1 drop into both eyes at bedtime.   LEVETIRACETAM (KEPPRA) 100 MG/ML SOLUTION    Take 5 mLs (500 mg total) by mouth 2 (two) times daily.   MORPHINE (ROXANOL) 20 MG/ML CONCENTRATED SOLUTION    Give 5 mL by mouth every 8 hours as needed for pain   MULTIPLE VITAMINS-MINERALS (DECUBI-VITE) CAPS    Take 1 capsule by mouth daily.   NON FORMULARY    Med Pass 120 cc by mouth three times daily   NON FORMULARY    House supplement with meals for caloric support and weight loss 4 oz three times daily with meals   POLYETHYL GLYCOL-PROPYL GLYCOL (SYSTANE) 0.4-0.3 % SOLN    Apply 1 drop to eye 4 (four) times daily.   SENNOSIDES-DOCUSATE SODIUM (SENOKOT-S) 8.6-50 MG TABLET    Take 2 tablets by mouth 2 (two) times daily.  Modified Medications   No medications on file  Discontinued Medications   OXYCODONE (OXY IR/ROXICODONE) 5 MG IMMEDIATE RELEASE TABLET    Take 1 tablet (5 mg total) by mouth every 8 (eight) hours as needed for severe pain. Reported on 12/03/2015    Review of Systems  Unable to perform ROS: Dementia    Filed Vitals:   12/14/15 0840  BP: 126/64  Pulse: 78  Temp: 98.4 F (36.9 C)  TempSrc: Oral  Resp: 18  Height: _0  (1.778 m)  Weight: 141 lb 3.2 oz (64.048 kg)   Body mass index is 20.26 kg/(m^2).  Physical Exam  Constitutional: He appears well-developed. No distress.  Frail appearing lying in bed in NAD, nonverbal  HENT:  Mouth/Throat: Oropharynx is clear and moist.  Eyes: Pupils are equal, round, and reactive to light. No scleral icterus.  Neck: Neck supple. Carotid bruit is not present.  Cardiovascular: Normal rate, regular rhythm  and intact distal pulses.  Exam reveals no gallop and no friction rub.   Murmur (1/6 SEM) heard. No LE edema b/l. No calf TTP  Pulmonary/Chest: Effort normal and breath sounds normal. He has no wheezes. He has no rales. He exhibits no tenderness.  Abdominal: Soft. Bowel sounds are normal. He exhibits no distension, no abdominal bruit, no pulsatile midline mass and no mass. There is no tenderness. There is no rebound and no guarding.  Musculoskeletal: He exhibits edema and tenderness.  RUE contracture- flexion at elbow and fingers  Lymphadenopathy:    He has no cervical adenopathy.  Neurological: He is alert. He exhibits abnormal muscle tone.  Right hemiparesis with muscle atrophy and spasticity noted  Skin: Skin is warm and dry. No rash noted.  prevalon boots intact b/l. B/l LE dsg c/d/i. Chronic venous stasis changes in LE  Psychiatric: He has a normal mood and affect. His behavior is normal.     Labs reviewed: Nursing Home on 12/13/2015  Component Date Value Ref Range Status  . Hemoglobin A1C 12/06/2015 5.5   Final  . PREALBUMIN 11/05/2015 22   Final  Admission on 12/06/2015, Discharged on 12/09/2015  Component Date Value Ref Range Status  . Sodium 12/06/2015 142  135 - 145 mmol/L Final  . Potassium 12/06/2015 5.4* 3.5 - 5.1 mmol/L Final   HEMOLYSIS AT THIS LEVEL MAY AFFECT RESULT  . Chloride 12/06/2015 105  101 - 111 mmol/L Final  . CO2 12/06/2015 23  22 - 32 mmol/L Final  . Glucose, Bld 12/06/2015 68  65 - 99 mg/dL Final  . BUN 12/06/2015 21* 6 - 20 mg/dL Final  . Creatinine, Ser 12/06/2015 1.01  0.61 - 1.24 mg/dL Final  . Calcium 12/06/2015 9.4  8.9 - 10.3 mg/dL Final  . Total Protein 12/06/2015 7.5  6.5 - 8.1 g/dL Final  . Albumin 12/06/2015 3.5  3.5 - 5.0 g/dL Final  . AST 12/06/2015 32  15 - 41 U/L Final  . ALT 12/06/2015 26  17 - 63 U/L Final  . Alkaline Phosphatase 12/06/2015 111  38 - 126 U/L Final  . Total Bilirubin 12/06/2015 1.4* 0.3 - 1.2 mg/dL Final  . GFR calc  non Af Amer 12/06/2015 >60  >60 mL/min Final  . GFR calc Af Amer 12/06/2015 >60  >60 mL/min Final   Comment: (NOTE) The eGFR has been calculated using the CKD EPI equation. This calculation has not been validated in all clinical situations. eGFR's persistently <60 mL/min signify possible Chronic Kidney Disease.   . Anion gap 12/06/2015 14  5 - 15 Final  . Color, Urine 12/06/2015 AMBER* YELLOW Final   BIOCHEMICALS MAY BE AFFECTED BY COLOR  . APPearance 12/06/2015 CLOUDY* CLEAR Final  . Specific Gravity, Urine 12/06/2015 1.029  1.005 - 1.030 Final  . pH 12/06/2015 5.5  5.0 - 8.0 Final  . Glucose, UA 12/06/2015 NEGATIVE  NEGATIVE mg/dL Final  . Hgb urine dipstick 12/06/2015 LARGE* NEGATIVE Final  . Bilirubin Urine 12/06/2015 MODERATE* NEGATIVE Final  . Ketones, ur 12/06/2015 >80* NEGATIVE mg/dL Final  . Protein, ur 12/06/2015 30* NEGATIVE mg/dL Final  . Nitrite 12/06/2015 POSITIVE* NEGATIVE Final  . Leukocytes, UA 12/06/2015 SMALL* NEGATIVE Final  . WBC 12/06/2015 5.2  4.0 - 10.5 K/uL Final  . RBC 12/06/2015 3.95* 4.22 - 5.81 MIL/uL Final  . Hemoglobin 12/06/2015 13.1  13.0 - 17.0 g/dL Final  . HCT 12/06/2015 40.2  39.0 - 52.0 % Final  . MCV 12/06/2015 101.8* 78.0 - 100.0 fL Final  . MCH 12/06/2015 33.2  26.0 - 34.0 pg Final  . MCHC 12/06/2015 32.6  30.0 - 36.0 g/dL Final  . RDW 12/06/2015 13.2  11.5 - 15.5 % Final  . Platelets 12/06/2015 207  150 - 400 K/uL Final  . Neutrophils Relative % 12/06/2015 43   Final  . Neutro Abs 12/06/2015 2.3  1.7 - 7.7 K/uL Final  . Lymphocytes Relative 12/06/2015 39   Final  . Lymphs Abs 12/06/2015 2.0  0.7 - 4.0 K/uL Final  . Monocytes Relative 12/06/2015 13   Final  . Monocytes Absolute 12/06/2015 0.7  0.1 - 1.0 K/uL Final  . Eosinophils Relative 12/06/2015 5   Final  . Eosinophils Absolute 12/06/2015 0.2  0.0 - 0.7 K/uL Final  . Basophils Relative 12/06/2015 0   Final  . Basophils Absolute 12/06/2015 0.0  0.0 - 0.1 K/uL Final  . Squamous  Epithelial / LPF 12/06/2015 0-5* NONE SEEN Final  . WBC, UA 12/06/2015 6-30  0 - 5 WBC/hpf Final  . RBC / HPF 12/06/2015 TOO NUMEROUS TO COUNT  0 - 5 RBC/hpf Final  . Bacteria, UA 12/06/2015 FEW* NONE SEEN Final  . Urine-Other 12/06/2015 MUCOUS PRESENT   Final  . Specimen Description 12/06/2015 URINE, RANDOM   Final  . Special Requests 12/06/2015 NONE   Final  . Culture 12/06/2015 20,000 COLONIES/mL [facility]*  Final  . Report Status 12/06/2015 12/09/2015 FINAL   Final  . Organism ID, Bacteria 12/06/2015 PROVIDENCIA STUARTII*  Final  . MRSA by PCR 12/06/2015 NEGATIVE  NEGATIVE Final   Comment:        The GeneXpert MRSA Assay (FDA approved for NASAL specimens only), is one component of a comprehensive MRSA colonization surveillance program. It is not intended to diagnose MRSA infection nor to guide or monitor treatment for MRSA infections.   . Carbamazepine Lvl 12/06/2015 3.1* 4.0 - 12.0 ug/mL Final  . WBC 12/06/2015 5.9  4.0 - 10.5 K/uL Final  . RBC 12/06/2015 3.64* 4.22 - 5.81 MIL/uL Final  . Hemoglobin 12/06/2015 11.9* 13.0 - 17.0 g/dL Final  . HCT 12/06/2015 36.6* 39.0 - 52.0 % Final  . MCV 12/06/2015 100.5* 78.0 - 100.0 fL Final  . MCH 12/06/2015 32.7  26.0 - 34.0 pg Final  . MCHC 12/06/2015 32.5  30.0 - 36.0 g/dL Final  . RDW 12/06/2015 13.0  11.5 - 15.5 % Final  . Platelets 12/06/2015 208  150 - 400 K/uL Final  . Creatinine, Ser 12/06/2015 1.10  0.61 - 1.24 mg/dL Final  . GFR calc non Af Amer 12/06/2015 57* >60 mL/min Final  . GFR calc Af Amer 12/06/2015 >60  >60 mL/min Final   Comment: (NOTE) The eGFR has been calculated using the CKD EPI equation. This calculation has not been validated in all clinical situations. eGFR's persistently <60 mL/min signify possible Chronic Kidney Disease.   . Sodium 12/07/2015 142  135 - 145 mmol/L Final  . Potassium 12/07/2015 4.4  3.5 - 5.1 mmol/L Final  . Chloride 12/07/2015 108  101 - 111 mmol/L Final  . CO2 12/07/2015  22  22 - 32 mmol/L Final  . Glucose, Bld 12/07/2015 112* 65 - 99 mg/dL Final  . BUN 12/07/2015 20  6 - 20 mg/dL Final  . Creatinine, Ser 12/07/2015 0.98  0.61 - 1.24 mg/dL Final  . Calcium 12/07/2015 8.9  8.9 - 10.3 mg/dL Final  . Total Protein 12/07/2015 6.8  6.5 - 8.1 g/dL Final  . Albumin 12/07/2015 3.1* 3.5 - 5.0 g/dL Final  . AST 12/07/2015 21  15 - 41 U/L Final  . ALT 12/07/2015 22  17 - 63 U/L Final  . Alkaline Phosphatase 12/07/2015 103  38 - 126 U/L Final  . Total Bilirubin 12/07/2015 1.2  0.3 - 1.2 mg/dL Final  . GFR calc non Af Amer 12/07/2015 >60  >60 mL/min Final  . GFR calc Af Amer 12/07/2015 >60  >60 mL/min Final   Comment: (NOTE) The eGFR has been calculated using the CKD EPI equation. This calculation has not been validated in all clinical situations. eGFR's persistently <60 mL/min signify  possible Chronic Kidney Disease.   . Anion gap 12/07/2015 12  5 - 15 Final  . WBC 12/07/2015 6.1  4.0 - 10.5 K/uL Final  . RBC 12/07/2015 3.45* 4.22 - 5.81 MIL/uL Final  . Hemoglobin 12/07/2015 11.3* 13.0 - 17.0 g/dL Final  . HCT 12/07/2015 34.5* 39.0 - 52.0 % Final  . MCV 12/07/2015 100.0  78.0 - 100.0 fL Final  . MCH 12/07/2015 32.8  26.0 - 34.0 pg Final  . MCHC 12/07/2015 32.8  30.0 - 36.0 g/dL Final  . RDW 12/07/2015 13.0  11.5 - 15.5 % Final  . Platelets 12/07/2015 200  150 - 400 K/uL Final  . Neutrophils Relative % 12/07/2015 45   Final  . Neutro Abs 12/07/2015 2.7  1.7 - 7.7 K/uL Final  . Lymphocytes Relative 12/07/2015 36   Final  . Lymphs Abs 12/07/2015 2.2  0.7 - 4.0 K/uL Final  . Monocytes Relative 12/07/2015 14   Final  . Monocytes Absolute 12/07/2015 0.9  0.1 - 1.0 K/uL Final  . Eosinophils Relative 12/07/2015 5   Final  . Eosinophils Absolute 12/07/2015 0.3  0.0 - 0.7 K/uL Final  . Basophils Relative 12/07/2015 0   Final  . Basophils Absolute 12/07/2015 0.0  0.0 - 0.1 K/uL Final  . Glucose-Capillary 12/07/2015 79  65 - 99 mg/dL Final  . Glucose-Capillary  12/07/2015 102* 65 - 99 mg/dL Final  . Glucose-Capillary 12/07/2015 112* 65 - 99 mg/dL Final  . Glucose-Capillary 12/07/2015 137* 65 - 99 mg/dL Final  . WBC 12/08/2015 4.5  4.0 - 10.5 K/uL Final  . RBC 12/08/2015 2.50* 4.22 - 5.81 MIL/uL Final  . Hemoglobin 12/08/2015 8.4* 13.0 - 17.0 g/dL Final   Comment: DELTA CHECK NOTED REPEATED TO VERIFY   . HCT 12/08/2015 25.5* 39.0 - 52.0 % Final  . MCV 12/08/2015 102.0* 78.0 - 100.0 fL Final  . MCH 12/08/2015 33.6  26.0 - 34.0 pg Final  . MCHC 12/08/2015 32.9  30.0 - 36.0 g/dL Final  . RDW 12/08/2015 12.9  11.5 - 15.5 % Final  . Platelets 12/08/2015 170  150 - 400 K/uL Final  . Glucose-Capillary 12/07/2015 126* 65 - 99 mg/dL Final  . Glucose-Capillary 12/07/2015 119* 65 - 99 mg/dL Final  . Comment 1 12/07/2015 Notify RN   Final  . Comment 2 12/07/2015 Document in Chart   Final  . Glucose-Capillary 12/08/2015 110* 65 - 99 mg/dL Final  . Comment 1 12/08/2015 Notify RN   Final  . Comment 2 12/08/2015 Document in Chart   Final  . Glucose-Capillary 12/08/2015 128* 65 - 99 mg/dL Final  . Comment 1 12/08/2015 Notify RN   Final  . Comment 2 12/08/2015 Document in Chart   Final  . Glucose-Capillary 12/08/2015 121* 65 - 99 mg/dL Final  . WBC 12/08/2015 5.1  4.0 - 10.5 K/uL Final  . RBC 12/08/2015 3.30* 4.22 - 5.81 MIL/uL Final  . Hemoglobin 12/08/2015 10.7* 13.0 - 17.0 g/dL Final   REPEATED TO VERIFY  . HCT 12/08/2015 32.7* 39.0 - 52.0 % Final  . MCV 12/08/2015 99.1  78.0 - 100.0 fL Final  . MCH 12/08/2015 32.4  26.0 - 34.0 pg Final  . MCHC 12/08/2015 32.7  30.0 - 36.0 g/dL Final  . RDW 12/08/2015 12.8  11.5 - 15.5 % Final  . Platelets 12/08/2015 196  150 - 400 K/uL Final  . Sodium 12/08/2015 140  135 - 145 mmol/L Final  . Potassium 12/08/2015 4.0  3.5 - 5.1 mmol/L  Final  . Chloride 12/08/2015 108  101 - 111 mmol/L Final  . CO2 12/08/2015 25  22 - 32 mmol/L Final  . Glucose, Bld 12/08/2015 128* 65 - 99 mg/dL Final  . BUN 12/08/2015 11  6 - 20  mg/dL Final  . Creatinine, Ser 12/08/2015 0.74  0.61 - 1.24 mg/dL Final  . Calcium 12/08/2015 8.7* 8.9 - 10.3 mg/dL Final  . Total Protein 12/08/2015 6.5  6.5 - 8.1 g/dL Final  . Albumin 12/08/2015 2.9* 3.5 - 5.0 g/dL Final  . AST 12/08/2015 20  15 - 41 U/L Final  . ALT 12/08/2015 20  17 - 63 U/L Final  . Alkaline Phosphatase 12/08/2015 90  38 - 126 U/L Final  . Total Bilirubin 12/08/2015 0.6  0.3 - 1.2 mg/dL Final  . GFR calc non Af Amer 12/08/2015 >60  >60 mL/min Final  . GFR calc Af Amer 12/08/2015 >60  >60 mL/min Final   Comment: (NOTE) The eGFR has been calculated using the CKD EPI equation. This calculation has not been validated in all clinical situations. eGFR's persistently <60 mL/min signify possible Chronic Kidney Disease.   . Anion gap 12/08/2015 7  5 - 15 Final  . Glucose-Capillary 12/08/2015 112* 65 - 99 mg/dL Final  . Glucose-Capillary 12/08/2015 122* 65 - 99 mg/dL Final  . Glucose-Capillary 12/08/2015 136* 65 - 99 mg/dL Final  . Glucose-Capillary 12/09/2015 81  65 - 99 mg/dL Final  . Glucose-Capillary 12/09/2015 99  65 - 99 mg/dL Final  . Glucose-Capillary 12/09/2015 104* 65 - 99 mg/dL Final  . Glucose-Capillary 12/09/2015 120* 65 - 99 mg/dL Final  . Glucose-Capillary 12/08/2015 126* 65 - 99 mg/dL Final  Nursing Home on 10/28/2015  Component Date Value Ref Range Status  . Hemoglobin 10/27/2015 10.3* 13.5 - 17.5 g/dL Final  . HCT 10/27/2015 30* 41 - 53 % Final  . Platelets 10/27/2015 271  150 - 399 K/L Final  . WBC 10/27/2015 6.6   Final  . Glucose 10/27/2015 83   Final  . BUN 10/27/2015 20  4 - 21 mg/dL Final  . Creatinine 10/27/2015 1.0  0.6 - 1.3 mg/dL Final  . Potassium 10/27/2015 4.4  3.4 - 5.3 mmol/L Final  . Sodium 10/27/2015 142  137 - 147 mmol/L Final  . Alkaline Phosphatase 10/27/2015 99  25 - 125 U/L Final  . ALT 10/27/2015 9* 10 - 40 U/L Final  . AST 10/27/2015 10* 14 - 40 U/L Final    Dg Chest Portable 1 View  12/06/2015  CLINICAL DATA:   Weakness EXAM: PORTABLE CHEST 1 VIEW COMPARISON:  07/18/2013 FINDINGS: Cardiac shadow is within normal limits. The lungs are well aerated bilaterally. No focal infiltrate or sizable effusion is seen. Some stable changes in the left base consistent with scarring are noted. No bony abnormality is seen. IMPRESSION: No active disease. Electronically Signed   By: Inez Catalina M.D.   On: 12/06/2015 17:19     Assessment/Plan   ICD-9-CM ICD-10-CM   1. Failure to thrive in adult 783.7 R62.7   2. Mixed Alzheimer's and vascular dementia 331.0 G30.9    294.10 F01.50    290.40 F02.80   3. Seizures (HCC) 780.39 R56.9   4. History of stroke with residual deficit 438.9 I69.30   5. Dysphagia, oropharyngeal phase 787.22 R13.12   6. Depression due to dementia 311 F32.9    294.10 F02.80   7. Controlled type 2 diabetes mellitus with diabetic neuropathy, with long-term current use of insulin ([place])  250.60 E11.40    357.2 Z79.4    V58.67      Hospice to follow  Palliative care f/u  Cont current meds as ordered  GOAL: keep him comfortable. Communicated with pt and nursing. D/w pt's wife at bedside.  Will follow   Martie Fulgham S. Perlie Gold  [facility] [address] [place], [place] [address] [phone] Cell (Monday-Friday 8 AM - 5 PM) [phone] After 5 PM and follow prompts

## 2015-12-29 ENCOUNTER — Telehealth: Payer: Self-pay | Admitting: Internal Medicine

## 2015-12-29 NOTE — Telephone Encounter (Signed)
Patient is a resident of [facility]. Ivar Bury, daughter notified to pick up FMLA paperwork and take to facility to have filled out and signed due to not a patient in office. She agreed and will pick up. Left up front with her name on it.

## 2015-12-31 ENCOUNTER — Telehealth: Payer: Self-pay

## 2015-12-31 NOTE — Telephone Encounter (Signed)
Received call from Hospice patient died 16-Jan-2016 11:25.

## 2016-01-28 DEATH — deceased

## 2016-12-02 IMAGING — CR DG CHEST 1V PORT
1 series · 1 of 1 positions shown · non-contrast
Comparison: 07/18/2013

CLINICAL DATA: Weakness

EXAM:
PORTABLE CHEST 1 VIEW

[AP]
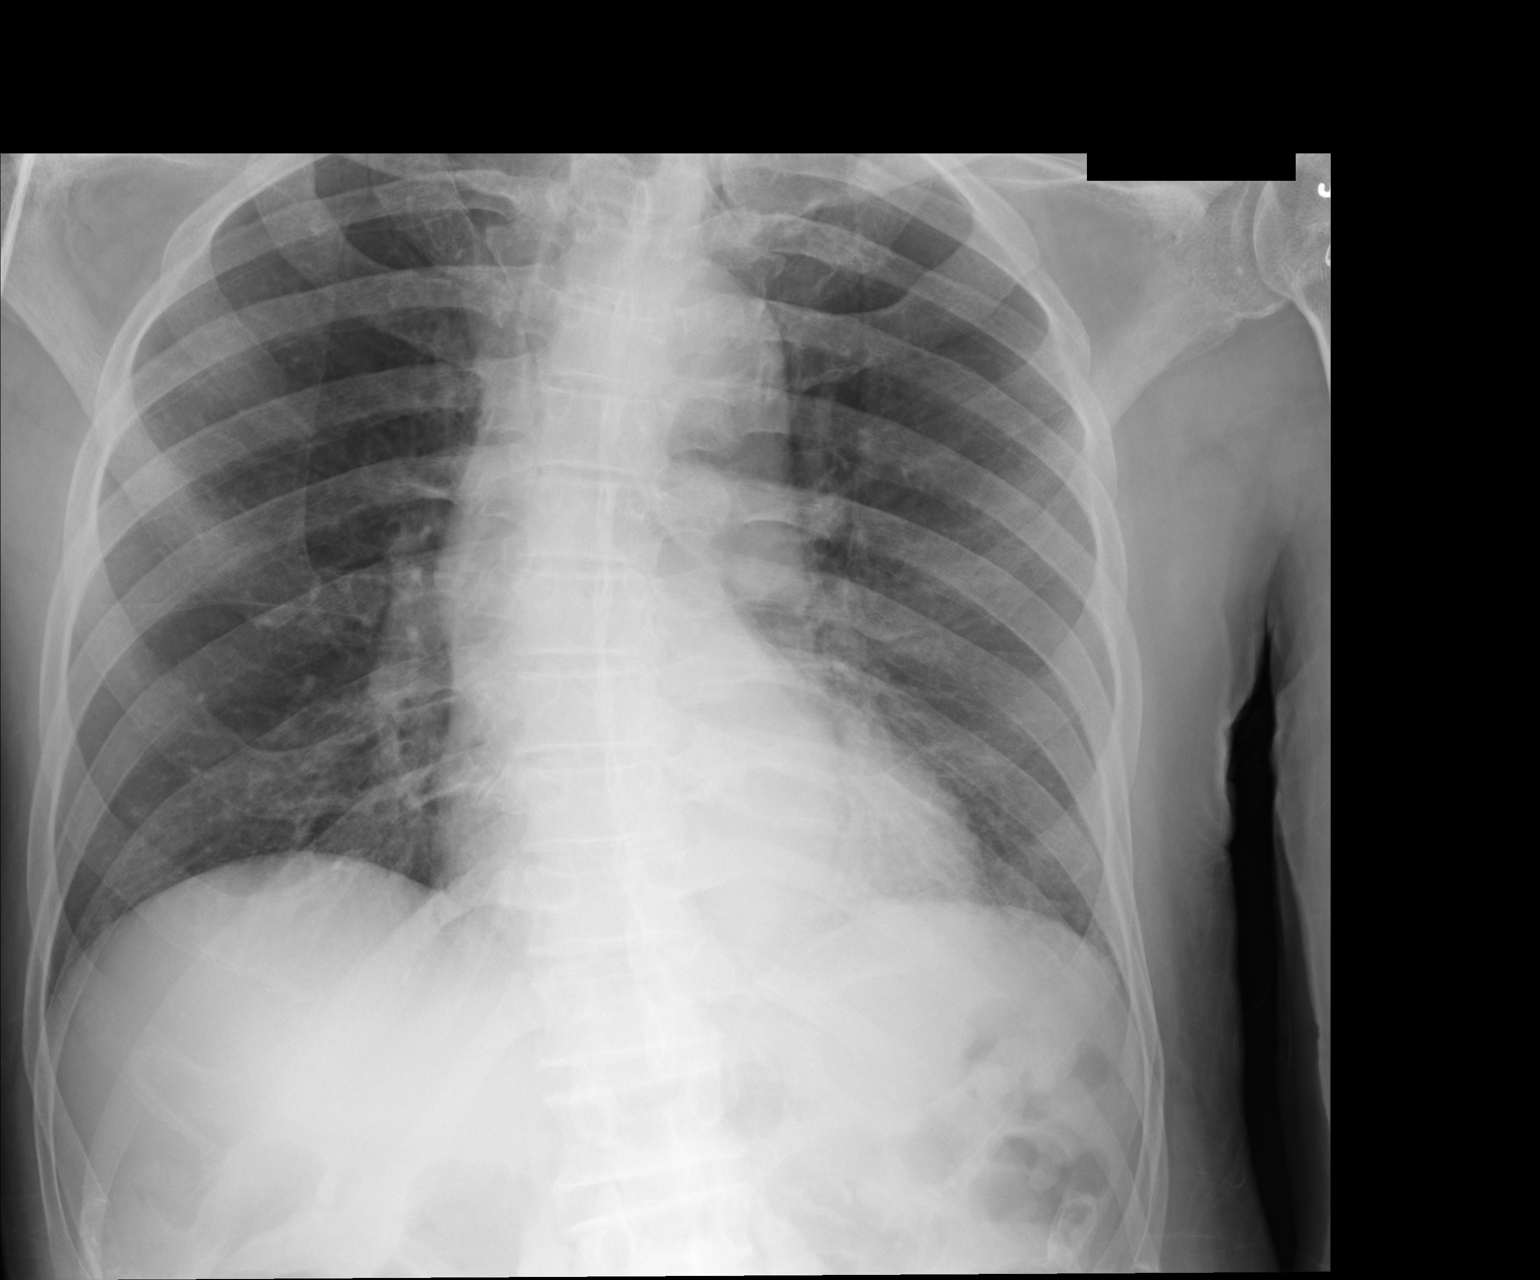

[1 of 1 positions shown; findings below may reference images not displayed]

FINDINGS: Cardiac shadow is within normal limits. The lungs are well aerated
bilaterally. No focal infiltrate or sizable effusion is seen. Some
stable changes in the left base consistent with scarring are noted.
No bony abnormality is seen.
IMPRESSION: No active disease.
